# Patient Record
Sex: Female | Born: 1935 | Race: White | Hispanic: No | Smoking: Former smoker
Health system: Southern US, Community
[De-identification: ages and names within clinical notes are randomized; demographics above are authoritative.]

## PROBLEM LIST (undated history)

## (undated) DIAGNOSIS — E079 Disorder of thyroid, unspecified: Secondary | ICD-10-CM

## (undated) DIAGNOSIS — I10 Essential (primary) hypertension: Secondary | ICD-10-CM

## (undated) DIAGNOSIS — E039 Hypothyroidism, unspecified: Secondary | ICD-10-CM

## (undated) DIAGNOSIS — M1990 Unspecified osteoarthritis, unspecified site: Secondary | ICD-10-CM

## (undated) DIAGNOSIS — I4821 Permanent atrial fibrillation: Secondary | ICD-10-CM

## (undated) DIAGNOSIS — D696 Thrombocytopenia, unspecified: Secondary | ICD-10-CM

## (undated) DIAGNOSIS — I350 Nonrheumatic aortic (valve) stenosis: Secondary | ICD-10-CM

## (undated) DIAGNOSIS — I5032 Chronic diastolic (congestive) heart failure: Secondary | ICD-10-CM

## (undated) DIAGNOSIS — Z8711 Personal history of peptic ulcer disease: Secondary | ICD-10-CM

## (undated) DIAGNOSIS — Z950 Presence of cardiac pacemaker: Secondary | ICD-10-CM

## (undated) DIAGNOSIS — Z952 Presence of prosthetic heart valve: Secondary | ICD-10-CM

## (undated) DIAGNOSIS — G629 Polyneuropathy, unspecified: Secondary | ICD-10-CM

## (undated) DIAGNOSIS — E785 Hyperlipidemia, unspecified: Secondary | ICD-10-CM

## (undated) DIAGNOSIS — E7800 Pure hypercholesterolemia, unspecified: Secondary | ICD-10-CM

## (undated) DIAGNOSIS — I495 Sick sinus syndrome: Secondary | ICD-10-CM

## (undated) HISTORY — DX: Personal history of peptic ulcer disease: Z87.11

## (undated) HISTORY — DX: Chronic diastolic (congestive) heart failure: I50.32

## (undated) HISTORY — PX: APPENDECTOMY: SHX54

## (undated) HISTORY — DX: Hyperlipidemia, unspecified: E78.5

## (undated) HISTORY — DX: Sick sinus syndrome: I49.5

## (undated) HISTORY — PX: ABDOMINAL HYSTERECTOMY: SHX81

## (undated) HISTORY — DX: Essential (primary) hypertension: I10

## (undated) HISTORY — DX: Nonrheumatic aortic (valve) stenosis: I35.0

## (undated) HISTORY — DX: Polyneuropathy, unspecified: G62.9

## (undated) HISTORY — DX: Disorder of thyroid, unspecified: E07.9

## (undated) HISTORY — DX: Permanent atrial fibrillation: I48.21

## (undated) HISTORY — DX: Pure hypercholesterolemia, unspecified: E78.00

## (undated) HISTORY — DX: Thrombocytopenia, unspecified: D69.6

---

## 1998-05-31 ENCOUNTER — Inpatient Hospital Stay (HOSPITAL_COMMUNITY): Admission: EM | Admit: 1998-05-31 | Discharge: 1998-06-09 | Payer: Self-pay | Admitting: Cardiology

## 1998-05-31 ENCOUNTER — Encounter: Payer: Self-pay | Admitting: Cardiology

## 1999-01-06 ENCOUNTER — Ambulatory Visit (HOSPITAL_COMMUNITY): Admission: RE | Admit: 1999-01-06 | Discharge: 1999-01-06 | Payer: Self-pay | Admitting: Obstetrics and Gynecology

## 1999-01-06 ENCOUNTER — Encounter: Payer: Self-pay | Admitting: Obstetrics and Gynecology

## 1999-07-28 ENCOUNTER — Other Ambulatory Visit: Admission: RE | Admit: 1999-07-28 | Discharge: 1999-07-28 | Payer: Self-pay | Admitting: Obstetrics and Gynecology

## 2000-05-11 ENCOUNTER — Ambulatory Visit (HOSPITAL_COMMUNITY): Admission: RE | Admit: 2000-05-11 | Discharge: 2000-05-11 | Payer: Self-pay | Admitting: Obstetrics and Gynecology

## 2000-05-11 ENCOUNTER — Encounter: Payer: Self-pay | Admitting: Obstetrics and Gynecology

## 2000-11-23 ENCOUNTER — Other Ambulatory Visit: Admission: RE | Admit: 2000-11-23 | Discharge: 2000-11-23 | Payer: Self-pay | Admitting: Obstetrics and Gynecology

## 2001-05-09 ENCOUNTER — Encounter: Admission: RE | Admit: 2001-05-09 | Discharge: 2001-05-09 | Payer: Self-pay | Admitting: Obstetrics and Gynecology

## 2001-05-09 ENCOUNTER — Encounter: Payer: Self-pay | Admitting: Obstetrics and Gynecology

## 2004-12-07 ENCOUNTER — Observation Stay (HOSPITAL_COMMUNITY): Admission: RE | Admit: 2004-12-07 | Discharge: 2004-12-08 | Payer: Self-pay | Admitting: Orthopedic Surgery

## 2004-12-07 HISTORY — PX: SHOULDER SURGERY: SHX246

## 2005-05-15 ENCOUNTER — Inpatient Hospital Stay (HOSPITAL_COMMUNITY): Admission: EM | Admit: 2005-05-15 | Discharge: 2005-05-17 | Payer: Self-pay | Admitting: Cardiology

## 2005-05-16 HISTORY — PX: CARDIOVERSION: SHX1299

## 2006-01-31 ENCOUNTER — Encounter: Payer: Self-pay | Admitting: Cardiology

## 2006-12-18 ENCOUNTER — Ambulatory Visit (HOSPITAL_COMMUNITY): Admission: RE | Admit: 2006-12-18 | Discharge: 2006-12-18 | Payer: Self-pay | Admitting: Internal Medicine

## 2006-12-18 ENCOUNTER — Ambulatory Visit: Payer: Self-pay | Admitting: Vascular Surgery

## 2006-12-18 ENCOUNTER — Encounter: Payer: Self-pay | Admitting: Vascular Surgery

## 2006-12-20 ENCOUNTER — Encounter: Admission: RE | Admit: 2006-12-20 | Discharge: 2006-12-20 | Payer: Self-pay | Admitting: Internal Medicine

## 2006-12-31 ENCOUNTER — Encounter: Admission: RE | Admit: 2006-12-31 | Discharge: 2006-12-31 | Payer: Self-pay | Admitting: Internal Medicine

## 2006-12-31 ENCOUNTER — Encounter (INDEPENDENT_AMBULATORY_CARE_PROVIDER_SITE_OTHER): Payer: Self-pay | Admitting: *Deleted

## 2007-01-01 ENCOUNTER — Encounter (INDEPENDENT_AMBULATORY_CARE_PROVIDER_SITE_OTHER): Payer: Self-pay | Admitting: *Deleted

## 2007-01-01 ENCOUNTER — Encounter: Admission: RE | Admit: 2007-01-01 | Discharge: 2007-01-01 | Payer: Self-pay | Admitting: Internal Medicine

## 2007-01-01 ENCOUNTER — Other Ambulatory Visit: Admission: RE | Admit: 2007-01-01 | Discharge: 2007-01-01 | Payer: Self-pay | Admitting: Interventional Radiology

## 2007-02-21 ENCOUNTER — Encounter: Admission: RE | Admit: 2007-02-21 | Discharge: 2007-02-21 | Payer: Self-pay | Admitting: Surgery

## 2007-02-22 ENCOUNTER — Encounter (INDEPENDENT_AMBULATORY_CARE_PROVIDER_SITE_OTHER): Payer: Self-pay | Admitting: Surgery

## 2007-02-22 ENCOUNTER — Ambulatory Visit (HOSPITAL_BASED_OUTPATIENT_CLINIC_OR_DEPARTMENT_OTHER): Admission: RE | Admit: 2007-02-22 | Discharge: 2007-02-22 | Payer: Self-pay | Admitting: Surgery

## 2007-02-22 ENCOUNTER — Encounter: Admission: RE | Admit: 2007-02-22 | Discharge: 2007-02-22 | Payer: Self-pay | Admitting: Surgery

## 2007-02-22 HISTORY — PX: BREAST BIOPSY: SHX20

## 2007-03-11 ENCOUNTER — Inpatient Hospital Stay (HOSPITAL_COMMUNITY): Admission: EM | Admit: 2007-03-11 | Discharge: 2007-03-17 | Payer: Self-pay | Admitting: Emergency Medicine

## 2007-03-12 HISTORY — PX: ESOPHAGOGASTRODUODENOSCOPY: SHX1529

## 2007-12-23 ENCOUNTER — Encounter: Admission: RE | Admit: 2007-12-23 | Discharge: 2007-12-23 | Payer: Self-pay | Admitting: Internal Medicine

## 2008-06-18 ENCOUNTER — Inpatient Hospital Stay (HOSPITAL_COMMUNITY): Admission: RE | Admit: 2008-06-18 | Discharge: 2008-06-19 | Payer: Self-pay | Admitting: *Deleted

## 2008-06-18 HISTORY — PX: PACEMAKER INSERTION: SHX728

## 2009-01-18 ENCOUNTER — Encounter: Admission: RE | Admit: 2009-01-18 | Discharge: 2009-01-18 | Payer: Self-pay | Admitting: Internal Medicine

## 2009-01-26 ENCOUNTER — Encounter: Admission: RE | Admit: 2009-01-26 | Discharge: 2009-01-26 | Payer: Self-pay | Admitting: Internal Medicine

## 2009-07-02 ENCOUNTER — Encounter: Admission: RE | Admit: 2009-07-02 | Discharge: 2009-07-02 | Payer: Self-pay | Admitting: Internal Medicine

## 2009-09-16 ENCOUNTER — Encounter: Admission: RE | Admit: 2009-09-16 | Discharge: 2009-09-16 | Payer: Self-pay | Admitting: Internal Medicine

## 2010-06-02 ENCOUNTER — Ambulatory Visit: Payer: Self-pay | Admitting: Cardiology

## 2010-06-02 ENCOUNTER — Encounter: Payer: Self-pay | Admitting: Internal Medicine

## 2010-06-07 ENCOUNTER — Ambulatory Visit: Payer: Self-pay | Admitting: Cardiology

## 2010-06-14 ENCOUNTER — Ambulatory Visit: Payer: Self-pay | Admitting: Cardiology

## 2010-06-28 ENCOUNTER — Ambulatory Visit: Payer: Self-pay | Admitting: Cardiology

## 2010-07-13 ENCOUNTER — Ambulatory Visit: Payer: Self-pay | Admitting: Cardiovascular Disease

## 2010-07-13 ENCOUNTER — Encounter: Admission: RE | Admit: 2010-07-13 | Discharge: 2010-07-13 | Payer: Self-pay | Admitting: Internal Medicine

## 2010-07-27 ENCOUNTER — Ambulatory Visit: Payer: Self-pay | Admitting: Cardiology

## 2010-08-13 ENCOUNTER — Encounter: Payer: Self-pay | Admitting: Internal Medicine

## 2010-08-19 ENCOUNTER — Ambulatory Visit: Payer: Self-pay | Admitting: Cardiology

## 2010-09-19 ENCOUNTER — Ambulatory Visit: Payer: Self-pay | Admitting: Cardiology

## 2010-10-21 ENCOUNTER — Ambulatory Visit: Payer: Self-pay | Admitting: Cardiology

## 2010-10-28 ENCOUNTER — Ambulatory Visit
Admission: RE | Admit: 2010-10-28 | Discharge: 2010-10-28 | Payer: Self-pay | Source: Home / Self Care | Attending: Internal Medicine | Admitting: Internal Medicine

## 2010-10-28 ENCOUNTER — Encounter: Payer: Self-pay | Admitting: Internal Medicine

## 2010-10-28 DIAGNOSIS — I495 Sick sinus syndrome: Secondary | ICD-10-CM | POA: Insufficient documentation

## 2010-10-28 DIAGNOSIS — I482 Chronic atrial fibrillation, unspecified: Secondary | ICD-10-CM | POA: Insufficient documentation

## 2010-10-28 DIAGNOSIS — E785 Hyperlipidemia, unspecified: Secondary | ICD-10-CM | POA: Insufficient documentation

## 2010-11-03 NOTE — Assessment & Plan Note (Signed)
Summary: pacer check/st. jude   Visit Type:  Pacemaker check Referring Provider:  Dr Patty Sermons Primary Provider:  Dr Burton Apley   History of Present Illness: Ms Godeaux is a pleasant 77 yo WF with a h/o sick sinus syndrome and persistant atrial fibrillation s/p PPM implantation (SJM) by Dr Reyes Ivan who presents today to establish care in the EP device clinic.  She reports doing ver well recently.  She remains very active.  The patient denies symptoms of palpitations, chest pain, shortness of breath, orthopnea, PND, lower extremity edema, dizziness, presyncope, syncope, or neurologic sequela. The patient is tolerating medications without difficulties and is otherwise without complaint today.   Current Medications (verified): 1)  Furosemide 40 Mg Tabs (Furosemide) .... Take One Tablet By Mouth Every Other Day Alternating With 2 Tabs Every Other Day 2)  Digoxin 0.25 Mg Tabs (Digoxin) .... Take 1/2 Tablet By Mouth Daily 3)  Metoprolol Tartrate 100 Mg Tabs (Metoprolol Tartrate) .... Take One Tablet By Mouth Twice A Day 4)  Diltiazem Hcl Er Beads 240 Mg Xr24h-Cap (Diltiazem Hcl Er Beads) .... Take One Capsule By Mouth Daily 5)  Metformin Hcl 500 Mg Tabs (Metformin Hcl) .... 2 Tabs Two Times A Day 6)  Simvastatin 20 Mg Tabs (Simvastatin) .... Take One Tablet By Mouth Daily At Bedtime 7)  Niaspan 500 Mg Cr-Tabs (Niacin (Antihyperlipidemic)) .... Once Daily 8)  Warfarin Sodium 2.5 Mg Tabs (Warfarin Sodium) .... Use As Directed By Anticoagualtion Clinic 9)  Gabapentin 300 Mg Caps (Gabapentin) .... Take 1 Tab in The Am and 3 At Bedtime 10)  Vitamin B-12 500 Mcg  Tabs (Cyanocobalamin) .... Once Daily 11)  Ocuvite  Tabs (Multiple Vitamins-Minerals) .... 2 Once Daily  Allergies (verified): 1)  ! Codeine  Past History:  Past Medical History:  Hypertension.   Atrial fibrillation status post DC cardioversion   Bradycardia s/p PPM . Hyperlipidemia.  . Peripheral neuropathy.  . Remote history of  peptic ulcer disease.   Status post cataract surgery.   Status post appendectomy.  . Status post hysterectomy.  .Status post right rotator cuff surgery.  .Status post left breast lumpectomy and biopsy with benign       pathology.      Past Surgical History: Abdominal Hysterectomy-Total Appendectomy s/p PPM by Dr Reyes Ivan (SJM) 2009  Social History: Reviewed history from 10/28/2010 and no changes required. The patient is married.  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  She has a remote history of tobacco,   but quit over 35-40 years ago.  She drinks about one glass of wine per   month.   Review of Systems       All systems are reviewed and negative except as listed in the HPI.   Vital Signs:  Patient profile:   77 year old female Height:      66 inches Weight:      156 pounds BMI:     25.27 Pulse rate:   94 / minute BP sitting:   124 / 62  (left arm) Cuff size:   regular  Vitals Entered By: Hardin Negus, RMA (October 28, 2010 12:05 PM)  Physical Exam  General:  Well developed, well nourished, in no acute distress. Head:  normocephalic and atraumatic Eyes:  PERRLA/EOM intact; conjunctiva and lids normal. Mouth:  Teeth, gums and palate normal. Oral mucosa normal. Neck:  supple Chest Wall:  pacemaker pocket is well healed Lungs:  CTAB Heart:  RRR (Paced),  2/6 SEM LUSB (mid peaking) with audible S2 Abdomen:  Bowel sounds positive; abdomen soft and non-tender without masses, organomegaly, or hernias noted. No hepatosplenomegaly. Msk:  Back normal, normal gait. Muscle strength and tone normal. Extremities:  No clubbing or cyanosis. Neurologic:  Alert and oriented x 3. Skin:  Intact without lesions or rashes.   PPM Specifications Following MD:  Hillis Range, MD     Referring MD:  Rolla Plate PPM Vendor:  [facility] Jude     PPM Model Number:  [id number]     PPM Serial Number:  [id number] PPM DOI:  06/18/2008     PPM Implanting MD:  Charlynn Court  Lead 1    Location: RV     DOI: 06/18/2008     Model #: [id number]     Serial #: [id number]     Status: active  Magnet Response Rate:  BOL 98.6 ERI 86.3  Indications:  A-fib   PPM Follow Up Battery Voltage:  2.80 V     Battery Est. Longevity:  10 yrs     Right Ventricle  Amplitude: 12.0 mV, Impedance: 504 ohms, Threshold: 0.75 V at 0.40 msec  Episodes MS Episodes:  0     Percent Mode Switch:  0     Coumadin:  Yes Ventricular High Rate:  0     Ventricular Pacing:  13%  Parameters Mode:  VVI     Lower Rate Limit:  65     Next Cardiology Appt Due:  04/03/2011 Tech Comments:  NORMAL DEVICE FUNCTION.  CHANGED RV OUTPUT FROM 2.0 TO 2.5 V.  ROV IN 6 MTHS W/DEVICE CLINIC. Vella Kohler  October 28, 2010 12:17 PM MD Comments:  agree  Impression & Recommendations:  Problem # 1:  BRADYCARDIA-TACHYCARDIA SYNDROME (ICD-427.81) normal pacemaker function as above  Problem # 2:  ATRIAL FIBRILLATION, CHRONIC (ICD-427.31) stable continue coumadin  Problem # 3:  HYPERLIPIDEMIA (ICD-272.4) stable  Patient Instructions: 1)  Your physician wants you to follow-up in 6 months with device clinic and 12months with Dr Johney Frame:   You will receive a reminder letter in the mail two months in advance. If you don't receive a letter, please call our office to schedule the follow-up appointment. 2)  Your physician recommends that you continue on your current medications as directed. Please refer to the Current Medication list given to you today.

## 2010-11-03 NOTE — Miscellaneous (Signed)
Summary: Device preload  Clinical Lists Changes  Observations: Added new observation of PPM INDICATN: A-fib (08/13/2010 12:36) Added new observation of MAGNET RTE: BOL 98.6 ERI 86.3 (08/13/2010 12:36) Added new observation of PPMLEADSTAT1: active (08/13/2010 12:36) Added new observation of PPMLEADSER1: [id number] (08/13/2010 12:36) Added new observation of PPMLEADMOD1: 1688TC (08/13/2010 12:36) Added new observation of PPMLEADDOI1: 06/18/2008 (08/13/2010 12:36) Added new observation of PPMLEADLOC1: RV (08/13/2010 12:36) Added new observation of PPM DOI: 06/18/2008 (08/13/2010 12:36) Added new observation of PPM SERL#: [id number]  (08/13/2010 12:36) Added new observation of PPM MODL#: [id number]  (08/13/2010 12:36) Added new observation of PACEMAKERMFG: [employer] Jude  (08/13/2010 12:36) Added new observation of PPM IMP MD: Charlynn Court  (08/13/2010 12:36) Added new observation of PPM REFER MD: Rolla Plate  (08/13/2010 12:36) Added new observation of PACEMAKER MD: Hillis Range, MD  (08/13/2010 12:36)      PPM Specifications Following MD:  Hillis Range, MD     Referring MD:  Rolla Plate PPM Vendor:  [employer]     PPM Model Number:  [id number]     PPM Serial Number:  [id number] PPM DOI:  06/18/2008     PPM Implanting MD:  Charlynn Court  Lead 1    Location: RV     DOI: 06/18/2008     Model #: [id number]     Serial #: [id number]     Status: active  Magnet Response Rate:  BOL 98.6 ERI 86.3  Indications:  A-fib

## 2010-11-09 NOTE — Cardiovascular Report (Signed)
Summary: Office Visit   Office Visit   Imported By: Roderic Ovens 11/02/2010 15:13:25  _____________________________________________________________________  External Attachment:    Type:   Image     Comment:   External Document

## 2010-11-18 ENCOUNTER — Other Ambulatory Visit (INDEPENDENT_AMBULATORY_CARE_PROVIDER_SITE_OTHER): Payer: Medicare Other

## 2010-11-18 DIAGNOSIS — I4891 Unspecified atrial fibrillation: Secondary | ICD-10-CM

## 2010-11-18 DIAGNOSIS — Z7901 Long term (current) use of anticoagulants: Secondary | ICD-10-CM

## 2010-11-23 NOTE — Progress Notes (Signed)
Summary: [facility]: Office Visit  [facility]: Office Visit   Imported By: Earl Many 11/14/2010 11:47:40  _____________________________________________________________________  External Attachment:    Type:   Image     Comment:   External Document

## 2010-12-01 ENCOUNTER — Other Ambulatory Visit: Payer: Self-pay | Admitting: Neurosurgery

## 2010-12-01 DIAGNOSIS — M545 Low back pain: Secondary | ICD-10-CM

## 2010-12-01 DIAGNOSIS — M25552 Pain in left hip: Secondary | ICD-10-CM

## 2010-12-05 ENCOUNTER — Encounter (INDEPENDENT_AMBULATORY_CARE_PROVIDER_SITE_OTHER): Payer: Medicare Other

## 2010-12-05 DIAGNOSIS — I4891 Unspecified atrial fibrillation: Secondary | ICD-10-CM

## 2010-12-05 DIAGNOSIS — Z7901 Long term (current) use of anticoagulants: Secondary | ICD-10-CM

## 2010-12-15 ENCOUNTER — Ambulatory Visit
Admission: RE | Admit: 2010-12-15 | Discharge: 2010-12-15 | Disposition: A | Discharge status: Home / Self Care | Payer: Medicare Other | Source: Ambulatory Visit | Attending: Neurosurgery | Admitting: Neurosurgery

## 2010-12-15 ENCOUNTER — Ambulatory Visit
Admission: RE | Admit: 2010-12-15 | Discharge: 2010-12-15 | Disposition: A | Discharge status: Home / Self Care | Payer: MEDICARE | Source: Ambulatory Visit | Attending: Neurosurgery | Admitting: Neurosurgery

## 2010-12-15 DIAGNOSIS — M25552 Pain in left hip: Secondary | ICD-10-CM

## 2010-12-15 DIAGNOSIS — M545 Low back pain: Secondary | ICD-10-CM

## 2010-12-19 ENCOUNTER — Ambulatory Visit (INDEPENDENT_AMBULATORY_CARE_PROVIDER_SITE_OTHER): Payer: Medicare Other | Admitting: Cardiology

## 2010-12-19 DIAGNOSIS — E781 Pure hyperglyceridemia: Secondary | ICD-10-CM

## 2010-12-19 DIAGNOSIS — E119 Type 2 diabetes mellitus without complications: Secondary | ICD-10-CM

## 2010-12-19 DIAGNOSIS — I359 Nonrheumatic aortic valve disorder, unspecified: Secondary | ICD-10-CM

## 2010-12-19 DIAGNOSIS — I4891 Unspecified atrial fibrillation: Secondary | ICD-10-CM

## 2010-12-27 ENCOUNTER — Other Ambulatory Visit: Payer: Self-pay | Admitting: Cardiology

## 2010-12-27 DIAGNOSIS — I4891 Unspecified atrial fibrillation: Secondary | ICD-10-CM

## 2010-12-28 ENCOUNTER — Other Ambulatory Visit (HOSPITAL_COMMUNITY): Payer: Self-pay | Admitting: Cardiology

## 2010-12-28 DIAGNOSIS — I350 Nonrheumatic aortic (valve) stenosis: Secondary | ICD-10-CM

## 2010-12-29 ENCOUNTER — Ambulatory Visit (HOSPITAL_COMMUNITY): Payer: Medicare Other | Attending: Internal Medicine

## 2010-12-29 DIAGNOSIS — I359 Nonrheumatic aortic valve disorder, unspecified: Secondary | ICD-10-CM | POA: Insufficient documentation

## 2010-12-29 DIAGNOSIS — I350 Nonrheumatic aortic (valve) stenosis: Secondary | ICD-10-CM

## 2010-12-29 NOTE — Telephone Encounter (Signed)
escribe request  

## 2010-12-29 NOTE — Telephone Encounter (Signed)
[facility] °

## 2011-01-16 ENCOUNTER — Ambulatory Visit (INDEPENDENT_AMBULATORY_CARE_PROVIDER_SITE_OTHER): Payer: Medicare Other | Admitting: *Deleted

## 2011-01-16 DIAGNOSIS — I4891 Unspecified atrial fibrillation: Secondary | ICD-10-CM

## 2011-01-16 DIAGNOSIS — Z7901 Long term (current) use of anticoagulants: Secondary | ICD-10-CM

## 2011-01-16 LAB — POCT INR: INR: 2.6

## 2011-01-18 ENCOUNTER — Telehealth: Payer: Self-pay | Admitting: Cardiology

## 2011-01-18 NOTE — Telephone Encounter (Signed)
HAS NOT HEARD BACK ABOUT HER TEST - ECHO CAN SOMEONE CALL

## 2011-01-20 ENCOUNTER — Encounter: Payer: Self-pay | Admitting: Cardiology

## 2011-02-13 ENCOUNTER — Ambulatory Visit (INDEPENDENT_AMBULATORY_CARE_PROVIDER_SITE_OTHER): Payer: Medicare Other | Admitting: *Deleted

## 2011-02-13 DIAGNOSIS — Z7901 Long term (current) use of anticoagulants: Secondary | ICD-10-CM

## 2011-02-13 DIAGNOSIS — I4891 Unspecified atrial fibrillation: Secondary | ICD-10-CM

## 2011-02-14 NOTE — Discharge Summary (Signed)
[name], [name] NO.:  [id number]   MEDICAL RECORD NO.:  [id number]          PATIENT TYPE:  INP   LOCATION:  3704                         FACILITY:  [facility]   PHYSICIAN:  Elmore Guise., M.D.DATE OF BIRTH:  1927/06/10   DATE OF ADMISSION:  06/18/2008  DATE OF DISCHARGE:  06/19/2008                               DISCHARGE SUMMARY   DISCHARGE DIAGNOSES:  1. Slow atrial fibrillation status post single chamber pacemaker      implant.  2. Diabetes mellitus.  3. Dyslipidemia.  4. Hypertension.   HISTORY OF PRESENT ILLNESS:  [name] is a very pleasant 77 year old  white female who has history of atrial fibrillation.  She was referred  for pacemaker implant  because of bradycardia.   HOSPITAL COURSE:  The patient's hospital course was uncomplicated.  She  underwent single chamber permanent pacemaker implant on June 18, 2008.  Her postprocedure course also was uncomplicated.  She did have  one episode where she felt red and flushed after getting Ancef.  Her  subsequent doses of Ancef were discontinued.  She has had no fever,  chills, nausea, vomiting, no diarrhea.  Her site looks good with no  evidence of swelling, bleeding, or erythema.  She has been afebrile.  She will be discharged home today to continue the following medications.  1. Coumadin 2.5 mg daily.  2. Metoprolol 100 mg daily.  3. Digoxin 0.125 mg every other day.  4. Calcium with vitamin D once daily.  5. Lasix 40 mg daily.  6. Ocuvite 1 tablet twice daily.  7. Metformin 500 mg daily.  8. Neurontin 300 mg three times daily.  9. Cardizem 240 mg each evening.  10.Simvastatin 20 mg daily.  11.Niaspan 500 mg daily.  12.She is to use Tylenol Extra Strength as needed for pain.   She was given post pacemaker restrictions.  We did specifically discuss  not lifting anything heavier than 5 pounds with her left arm or lifting  her left arm above shoulder height for the next 2-3 weeks.  She is  also  to keep this area clean and dry for the next week and Betadine or Steri-  Strips once daily for the next 3 days.  She is to call the office if she  has any further problems otherwise I will see her in 1 week for post  pacemaker check and wound check.      Elmore Guise., M.D.  Electronically Signed     TWK/MEDQ  D:  06/19/2008  T:  06/19/2008  Job:  [id number]   cc:   Cassell Clement, M.D.

## 2011-02-14 NOTE — Op Note (Signed)
[name], [name]               ACCOUNT NO.:  [id number]   MEDICAL RECORD NO.:  [id number]          PATIENT TYPE:  AMB   LOCATION:  DSC                          FACILITY:  [facility]   PHYSICIAN:  Currie Paris, M.D.DATE OF BIRTH:  1927-07-08   DATE OF PROCEDURE:  02/22/2007  DATE OF DISCHARGE:                               OPERATIVE REPORT   OFFICE MEDICAL RECORD NUMBER:  [id number].   PREOPERATIVE DIAGNOSIS:  Left breast mass, indeterminate on core biopsy.   POSTOPERATIVE DIAGNOSIS:  Left breast mass, indeterminate on core  biopsy.   OPERATION:  Needle-guided excision of left breast mass.   SURGEON:  Currie Paris, M.D.   ANESTHESIA:  MAC.   CLINICAL HISTORY:  This is a 77 year old lady who had an abnormality  seen on mammography, and a core biopsy was indeterminate, showing a  sclerosing lesion.  It was thought that she needed an excisional biopsy  to clarify the diagnosis.   DESCRIPTION OF PROCEDURE:  The patient was seen in the holding area, and  we confirmed that a left breast biopsy was the planned procedure.  She  and I both marked the left breast as the operative side.  Her guidewire  had already been placed by radiology, and I reviewed those films.   The patient was taken to the operating room.  After satisfactory IV  sedation, the left breast was prepped and draped as a sterile field.  The time-out occurred.   One percent Xylocaine with epinephrine mixed equally with 0.5% plain  Marcaine was used for local.  I infiltrated the proposed incision line,  which was going to be a radial incision at the midlateral position, as  that was where the guidewire entered and was apparently tracking  somewhat superiorly, deep, and medial.   The incision was made, and I divided about 1.5 cm of the subcutaneous  tissue and then grasped the tissue around the guidewire and used that  for some traction and then excised a cylinder of tissue around the  guidewire, going  beyond the tip.  The guidewire felt to be fairly  central in the specimen.  This was sent for specimen mammography.   I infiltrated additional local as I had worked, and we made sure  everything was completely numb.  I put some local deep to be sure that  those areas were well anesthetized for postop pain relief.  Once  everything appeared to be dry, I closed in layers with 3-0 Vicryl,  irrigating between layers as we got closer to the skin.  Skin was closed  with 4-0  Monocryl subcuticular and Dermabond.  Radiology reported that the  specimen contained the clips and apparent abnormality.  The patient  tolerated the procedure well.  There were no operative complications,  and all counts were correct.      Currie Paris, M.D.  Electronically Signed     CJS/MEDQ  D:  02/22/2007  T:  02/22/2007  Job:  [id number]   cc:   Antony Madura, M.D.  Cassell Clement, M.D.

## 2011-02-14 NOTE — H&P (Signed)
[name], [name] NO.:  [id number]   MEDICAL RECORD NO.:  [id number]          PATIENT TYPE:  INP   LOCATION:  2108                         FACILITY:  [facility]   PHYSICIAN:  Hillery Aldo, M.D.   DATE OF BIRTH:  05/23/1927   DATE OF ADMISSION:  03/11/2007  DATE OF DISCHARGE:                              HISTORY & PHYSICAL   PRIMARY CARE PHYSICIAN:  Dr. Burton Apley.   CARDIOLOGIST:  Dr. Patty Sermons.   CHIEF COMPLAINT:  Nausea, vomiting with coffee-ground emesis, melena.   HISTORY OF PRESENT ILLNESS:  The patient is a 77 year old female with  past medical history of peptic ulcer disease remotely who presents with  several days of melanotic stools.  The patient describes the stools as  dark in color and somewhat hard to pass, and so she does not think very  much of it.  However, the patient did develop some black clots in the  stool today and became concerned.  She also had one episode of nausea  accompanied by vomiting with coffee-ground appearance to the emesis.  She also was dizzy today and feels exceptionally weak prompting her to  come to the emergency department for evaluation.  The patient has  evidence of an acute GI bleed, and therefore is being admitted for  further evaluation and workup.   PAST MEDICAL HISTORY:  1. Hypertension.  2. Atrial fibrillation status post DC cardioversion in the past.  3. Hyperlipidemia.  4. Type 2 diabetes.  5. Peripheral neuropathy.  6. Remote history of peptic ulcer disease.  7. Status post cataract surgery.  8. Status post appendectomy.  9. Status post hysterectomy.  10.Status post right rotator cuff surgery.  11.Status post left breast lumpectomy and biopsy with benign      pathology.   FAMILY HISTORY:  The patient's mother died at age 22 from complications  of childbirth pneumonia.  The patient's father died at 11 from  congestive heart failure.  He also suffered with coronary artery disease  and emphysema.   She has eight siblings.  Three sisters are deceased.  One died early in childhood.  One sister died of breast cancer, but also  suffered from congestive heart failure and coronary artery disease.  One  sister died of a cerebral hemorrhage due to aneurysm.  She has five  other healthy sisters.  She does not have any children.   SOCIAL HISTORY:  The patient is married.  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  She has a remote history of tobacco,  but quit over 35-40 years ago.  She drinks about one glass of wine per  month.   ALLERGIES:  CODEINE causes nausea and vomiting.   CURRENT MEDICATIONS:  1. Digitek 0.25 mg daily.  2. Neurontin 100 mg t.i.d.  3. Coumadin 2.5 mg as directed.  4. Metoprolol 100 mg b.i.d.  5. Zetia 10 mg daily.  6. Metformin 500 mg daily.  7. Cardizem 240 mg daily.  8. Vitamin B1 daily.  9. Ocuvite one tablet daily.  10.Flaxseed oil.   REVIEW OF  SYSTEMS:  The patient's appetite has been normal up until  today.  She denies any weight changes except for some slight weight loss  that was intentional.  Denies any chest pains.  She had some shortness  of breath this morning and has had a dry cough.  She also had some  abdominal pain which she describes as an aching pain similar to her  ulcer pain in the past prior to an episode of nausea and vomiting today.  Otherwise, as noted in the elements of HPI above.   PHYSICAL EXAMINATION:  VITAL SIGNS:  Temperature 98.9, pulse 76,  respirations 18, blood pressure 104/68, O2 saturation 100% on room air.  GENERAL:  Well-developed, well-nourished female who is no acute  distress.  She is pale in appearance.  HEENT: Normocephalic, atraumatic.  PERRL, EOMI.  Oropharynx clear with  moist mucous membranes.  NECK:  Supple, no thyromegaly, lymphadenopathy, no jugular venous  distension.  CHEST:  Lungs clear to auscultation bilaterally with good air movement.  HEART:  Heart sounds are irregularly irregular  and somewhat  tachycardiac.  No murmurs or rubs.  ABDOMEN:  Soft, nontender, nondistended with normoactive bowel sounds.  RECTAL:  Done by the ED physician did reveal fecal occult blood positive  stool.  EXTREMITIES:  There is 1+ edema bilaterally.  SKIN:  Warm and dry, pale.  NEUROLOGIC:  The patient is alert and oriented x3.  Cranial nerves II-  XII grossly intact.  Nonfocal.   LABORATORY DATA REVIEW:  White blood cell count 10.3, hemoglobin 10.1,  hematocrit 30.1, platelets 208 with an absolute neutrophil count 8.9,  MCV 90.7.  Sodium 140, potassium 4.7, chloride 106, bicarb 28, BUN 57,  creatinine 0.56, glucose 211.  Total bilirubin 0.8.  Total protein 5.9.  Albumin 3.1.  Calcium 8.8.  AST 27, ALT 24.  PT/INR 30.8/2.8.   ASSESSMENT/PLAN:  1. Upper gastrointestinal bleed likely secondary to her current ulcer:      We will admit the patient to the step-down unit and monitor her      hemodynamic status closely as well as check her hemoglobin q.6 h.      for the first 24 hours and transfuse as needed.  Dr. Matthias Hughs of      gastroenterology has already been consulted, and there are plans to      take her for an upper endoscopy tonight.  She has received fresh      frozen plasma to reverse her elevated INR.  She has been started on      IV Protonix which we will continue q.12 hours.  We will hold all      nonessential medications for now and keep her n.p.o.  She will      likely undergo testing for Helicobacter pylori with her upper      endoscopy and will treat this if it is positive.  Further      management will depend on clinical course.  2. Atrial fibrillation:  Continue the patient's Digitek and Cardizem      as well as metoprolol as long as she remains hemodynamically      stable.  We will support her with IV fluids as well.  We will hold      her therapeutic anticoagulation for now.  3. Diabetes:  Will hold the patient's metformin and use sliding scale     insulin for now.  We  will check her sugars q.4 h. and cover her as  needed.  4. Hyperlipidemia:  Will hold the patient steady until her      gastrointestinal status is stable.  5. Peripheral neuropathy:  We will hold the patient's Neurontin until      her gastrointestinal status is stable.  6. Prophylaxis:  We will use PAS hoses for deep venous thrombosis      prophylaxis, and she will be on q.12 h. proton pump inhibitor      therapy for her likely peptic ulcer disease.      Hillery Aldo, M.D.  Electronically Signed     CR/MEDQ  D:  03/11/2007  T:  03/12/2007  Job:  [id number]   cc:   Antony Madura, M.D.  Cassell Clement, M.D.

## 2011-02-14 NOTE — Consult Note (Signed)
[name], [name] NO.:  [id number]   MEDICAL RECORD NO.:  [id number]          PATIENT TYPE:  INP   LOCATION:  2108                         FACILITY:  [facility]   PHYSICIAN:  Janet Ras, MD   DATE OF BIRTH:  03-17-1927   DATE OF CONSULTATION:  DATE OF DISCHARGE:  03/12/2007                                 CONSULTATION   CONSULTING SURGEON:  Janet Ras, MD   REQUESTING PHYSICIAN:  Dr. Randa Benitez.   REASON FOR CONSULTATION:  We have been asked by Dr. Randa Benitez to see this  pleasant 79-year female patient who came in yesterday on the 9th with  complaints of acute GI bleeding.  She presented initially to [facility].  She reported at that time she was experiencing nausea and  upper abdominal pain. This resolved after she had a massive amount of  coffee-ground and tarry like emesis.  She is normally on Coumadin for  paroxysmal atrial fibrillation and her INR at presentation yesterday was  2.8.  She was stable up until about 2:00 a.m. when her systolic blood  pressure dropped in the 50s and her hemoglobin dropped to 6.3. It had  previously been 10.1, so she was given packed red blood cells as well as  FFP to reverse her Coumadin and she underwent emergent EGD.  She was  found to have a bleeding duodenal ulcer with an exposed vessel that was  clipped by Dr. Randa Benitez.  Patient currently is stable and she has been  subsequently transferred to [facility] to the MICU.  Hemoglobin is  still somewhat down at 7.9 but she reports feeling clinically better.  We have been asked to follow along with the patient in the event she  experiences re-bleeding that may require surgical intervention.   PAST MEDICAL HISTORY:  1. PAF/Coumadin  2. Remote history of peptic ulcer disease.  3. Recent NSAID use.  4. Diabetes  5. Hypertension  6. Dyslipidemia.   PAST SURGICAL HISTORY:  Hysterectomy, appendectomy, rotator cuff repair  and biopsy of benign breast  lesion.   LABS:  Hemoglobin today 7.9, platelets are down to 109,000.  There were  170,000.  BUN is elevated at 57.  Her INR today is 1.5.   PHYSICAL EXAM:  GENERAL:  Pleasant female patient without any further  complaints of pain and no further dark emesis.  She is reporting dark  stools that are foul-smelling.  VITAL SIGNS:  Temperature 99.2,  BP 108/43, pulse 112 and regular,  respirations 20 LUNGS:  Clear to auscultation.  Respiratory effort is  nonlabored.  CARDIAC:  S1-S2, no rubs, murmurs, thrills or gallops.  ABDOMEN:  Soft, nontender, nondistended without hepatosplenomegaly,  masses or bruits.  EXTREMITIES:  Symmetrical in appearance without edema.  Pulses palpable.   IMPRESSION:  1. Upper GI bleeding secondary to duodenal ulcer status post EGD with      clip application per GI.  2. Blood loss anemia.  Hemoglobin low but stable and increased.  3. Thrombocytopenia.   PLAN:  1. At this point the patient has no signs or symptoms of  continued      bleeding.  Will follow along with you in the event the patient      needs a surgical intervention.  2. Continue to watch the INR.  After the FFP that was given in the      past 24 hours washes out she could have a rebound or increase in      her INR.  3. Will defer any a transfusion of packed red blood cells to GI who is      the admitting service.      [name]      Janet Ras, MD  Electronically Signed    ALE/MEDQ  D:  03/12/2007  T:  03/12/2007  Job:  [id number]   cc:   [name]., M.D.

## 2011-02-14 NOTE — Discharge Summary (Signed)
[name], [name]               ACCOUNT NO.:  [id number]   MEDICAL RECORD NO.:  [id number]          PATIENT TYPE:  INP   LOCATION:  4736                         FACILITY:  [facility]   PHYSICIAN:  Ladell Pier, M.D.   DATE OF BIRTH:  [birth year]   DATE OF ADMISSION:  03/11/2007  DATE OF DISCHARGE:  03/17/2007                               DISCHARGE SUMMARY   DISCHARGE DIAGNOSIS:  1. Duodenal ulcer.  2. Gastrointestinal bleed secondary to nonsteroidal anti-inflammatory      drug use.  3. Blood loss anemia status post multiple transfusions.  4. Atrial fibrillation.  5. Thrombocytopenia.  6. Hypertension.  7. Dyslipidemia.  8. Diabetes.  9. Atrial fibrillation status post direct current cardioversion in the      past on chronic Coumadin therapy.  10.Peripheral neuropathy.  11.Remote history of peptic ulcer disease in 1997.  12.Status post cataract surgery.  13.Status post appendectomy.  14.Status post hysterectomy.  15.Status post right rotator cuff surgery.  16.Status post left breast lumpectomy and biopsy with benign      pathology.   DISCHARGE MEDICATIONS.:  1. Protonix 40 mg daily.  2. Digitek, 0.125 mg daily.  3. Diltiazem LA 180 mg daily.  4. Lasix 20 mg daily.  5. Protonix 40 mg daily.  6. Neurontin 100 mg 2 tablets at bedtime, 1 tablet in the morning.  7. Coumadin 2.5 mg daily except for 5 mg on Wednesdays.  8. Metoprolol 100 mg twice daily.  9. Zetia 10 mg daily.  10.Metformin 500 mg once daily.  11.Cardizem 240 mg daily.  12.Vitamin B1 daily.  13.Accu-Vite daily.  14.Flax seed oil daily.  15.Primrose oil daily.   FOLLOWUP APPOINTMENTS:  The patient to follow up with Dr. Matthias Hughs on  June 17 at 12:30 and with Dr. Patty Sermons on June 25.   PROCEDURES:  Upper endoscopy on March 12, 2007.  At the time of this  upper endoscopy, she was treated with 2 mL of epinephrine to the area of  the bleed, and Endoclip was placed.   CONSULTATIONS:  1. Cassell Clement, M.D.,  cardiology.  2. Bernette Redbird, M.D., gastroenterology.   HISTORY OF PRESENT ILLNESS:  The patient is a 77 year old female with  past medical history significant for peptic ulcer disease remotely who  presented with several days of melanotic stool. The patient described  the stool as dark in color. She started passing clots and later  developed some clots in the stool that was very concerning to her, so  she came into the emergency room. She also had some dizziness and felt  weak.   Past Medical History, Family History, Social History, Medications,  Allergies, Review of Systems are per admission H&P.   PHYSICAL EXAMINATION ON DISCHARGE:  VITAL SIGNS:  Temperature 97.8,  pulse of 34, respirations 20, blood pressure 125/62, pulse oximetry 95%  on room air.  CBG 110-160.  HEENT: Normocephalic, atraumatic.  Pupils reactive to light.  Throat  without erythema.  CARDIOVASCULAR: Irregular.  LUNGS:  Clear bilaterally.  ABDOMEN: Positive bowel sounds.  EXTREMITIES:  Without edema.   HOSPITAL COURSE:  #1.  DUODENAL  ULCER BLEED:  The patient was admitted to the hospital to  the intensive care unit.  She underwent upper endoscopy where the  bleeding was stopped with injections of epinephrine and Endoclip.  She  remained in the ICU.  Her hemoglobin after the transfusion was fairly  stable.  She was then transferred to the floor.  Her hemoglobin was 8.2  prior to discharge, so she was given 1 unit of blood per cardiology.  Hemoglobin increased to 9.6.  The patient will follow up outpatient.  Her Coumadin level was started prior to discharge, and her INR was 1.5.  She will resume her usual Coumadin dose   #2.  HYPERTENSION:  Her blood pressure medication was held during the  initial episode, but it was gradually restarted, and blood pressure on  discharge was 125/62.   #3.  ATRIAL FIBRILLATION:  Cardiology was consulted and managed her  atrial fibrillation with calcium channel blocker and  digoxin.   #4.  ANEMIA:  Blood loss anemia as explained earlier. She was transfused  several units of blood, and hemoglobin was stable and no active bleeding  noted on discharge.   DISCHARGE LABORATORY DATA:  Sodium 142, potassium 3.6, chloride 104, CO2  30, glucose 117, BUN 15, creatinine 0.65, calcium 8.8. PT 19.1, INR 1.5.  WBC 8.6, hemoglobin 9.6, platelet 170.   Chest x-ray:  No effusion, no acute disease.      Ladell Pier, M.D.  Electronically Signed     NJ/MEDQ  D:  03/17/2007  T:  03/17/2007  Job:  [id number]   cc:   Cassell Clement, M.D.  Bernette Redbird, M.D.

## 2011-02-14 NOTE — Consult Note (Signed)
[name], PRISK NO.:  [id number]   MEDICAL RECORD NO.:  [id number]          PATIENT TYPE:  INP   LOCATION:  2108                         FACILITY:  [facility]   PHYSICIAN:  Bernette Redbird, M.D.   DATE OF BIRTH:  Mar 11, 1927   DATE OF CONSULTATION:  03/11/2007  DATE OF DISCHARGE:                                 CONSULTATION   REASON FOR CONSULTATION:  Dr. Su Hilt asked [place] to see this 77 year old female because of GI  bleeding.   HISTORY OF PRESENT ILLNESS:  [name] has been seen by me in the past for both endoscopy and  colonoscopy, although it has been many, many years since her last  endoscopy.   With that background, there is a remote history of peptic ulcer disease  approximately 30 years ago, with no recent recurrent dyspeptic symptoms  in the past year or so.   Last week, she was having significant knee pain and was apparently  started on prescription NSAIDS of some sort through her primary  physician's office.  She does not recall the name of the medication but  it does sound as though it was a nonsteroidal anti-inflammatory drug.  This morning around 2:00 a.m., she began having multiple tarry melenic  stools and then developed presyncope while on the commode this  afternoon.  She has not had any dyspeptic upper tract symptoms in  association with this.  Of note, the patient is on Coumadin for chronic  atrial fibrillation.  She is not on a daily aspirin, however.   She presented to Dr. Su Hilt' office where she was noted to have melenic  stool and was sent to the emergency room where her hemoglobin was 10.1,  BUN was 57 and INR is 2.8.   ALLERGIES:  CODEINE.   OUTPATIENT MEDICATIONS:  1. Digitek.  2. Neurontin.  3. Coumadin.  4. Metoprolol.  5. Zetia.  6. Metformin.  7. Cardizem.  8. Ocuvite.   OPERATIONS:  1. Remote hysterectomy with partial oophorectomy and appendectomy.  2. Cataract surgery.   CHRONIC MEDICAL ILLNESSES:  1. Type  2 diabetes.  2. Chronic atrial fibrillation.  3. Hypertension.  4. Transient CHF.  5. No history of MI or COPD.  6. No chronic CHF.   HABITS:  Remote smoking history.  Rare ethanol.   FAMILY HISTORY:  Ulcers in her father.   SOCIAL HISTORY:  Married and husband is at bedside.   REVIEW OF SYSTEMS:  Tends to have a daily but somewhat constipated, hard  bowel movement, otherwise no real lower tract symptoms.  As mentioned  above, no upper tract symptoms.   PHYSICAL EXAMINATION:  GENERAL:  The patient is pale, but her skin is  warm and she is in no acute distress.  Heart rate about 110, irregular,  blood pressure 102/67.  CHEST:  Clear to auscultation.  HEART:  Rapid irregular rhythm, but otherwise is normal.  ABDOMEN:  Somewhat adipose, but nontender and without guarding or  masses.  I did not do rectal because she just had a melenic stool by  report.   LABORATORY DATA:  Pertinent labs include hemoglobin 10.1, BUN 57, INR  2.8.   IMPRESSION:  Upper GI bleed with moderate hemodynamic instability  probably secondary to NSAID induced gastric ulceration, with bleeding  magnified by virtue of the fact that the patient is on Coumadin.   PLAN:  1. PPI therapy.  2. Endoscopic evaluation later this evening once the Endo unit is      available, and hopefully after we have had chance to get at least      one unit of FFP transfused into the patient although that is not      essential prior to endoscopy.  3. I do favor temporarily reversing the patient's Coumadin by use of 2      units of FFP which will help with her blood pressure and      hemodynamic stability, but also will help temporarily reverse the      anticoagulation.   We appreciate the opportunity to have seen this patient in consultation  with you.           ______________________________  Bernette Redbird, M.D.     RB/MEDQ  D:  03/11/2007  T:  03/12/2007  Job:  [id number]   cc:   Antony Madura, M.D.

## 2011-02-14 NOTE — Op Note (Signed)
[name], [name] NO.:  [id number]   MEDICAL RECORD NO.:  [id number]          PATIENT TYPE:  INP   LOCATION:  2108                         FACILITY:  [facility]   PHYSICIAN:  Fayrene Fearing L. Malon Kindle., M.D.DATE OF BIRTH:  11/17/1926   DATE OF PROCEDURE:  03/12/2007  DATE OF DISCHARGE:                               OPERATIVE REPORT   PROCEDURE:  Esophagogastroduodenoscopy with control of bleeding.   MEDICATIONS:  Fentanyl 62.5 mcg, Versed 6 mg IV, epinephrine 1:10,000  2.5 mL.   INDICATION:  GI bleed in a woman on Coumadin with atrial fibrillation.  Pressure dropped and this was done acutely.   DESCRIPTION OF PROCEDURE:  The procedure had been explained to the  patient and consent obtained.  In the left lateral decubitus position,  the Pentax scope was inserted.  The esophagus was normal, no varices,  and no signs of bleeding.  A small amount of streaky blood was seen in  the stomach.  No gastric ulcers on the forward and retroflex view.  Upon  entering the duodenum a large amount of blood was seen.  This was  irrigated and near the junction of the duodenal bulb and second duodenum  was a small ulcer with a visible vessel with was actively bleeding.  It  was more of an ooze than an arterial pumper.  We irrigated this  vigorously and it continued to bleed.  We injected around it with 2 mL  of epinephrine with the bleeding diminishing.  I then placed to  Endoclips and it felt like with the second Endoclip the bleeding site  was completely obliterated.  I then went distal to the ulcer and  injected another 0.5 mL of epinephrine.  At the termination of the  procedure her per pressure had come up and there was no longer any  active bleeding.  The scope was withdrawn and the initial findings were  confirmed.   ASSESSMENT:  A bleeding duodenal ulcer controlled with epinephrine  injection and Endoclip.   PLAN:  Will continue the patient on Protonix, keep her n.p.o. for  now,  and probably had the surgeons follow her in the morning.  Correct her  anticoagulation and transfuse to keep her hemoglobin up near 10.           ______________________________  Llana Aliment. Malon Kindle., M.D.     Waldron Session  D:  03/12/2007  T:  03/12/2007  Job:  [id number]   cc:   Antony Madura, M.D.  Bernette Redbird, M.D.

## 2011-02-17 NOTE — Op Note (Signed)
[name], [name] NO.:  [id number]   MEDICAL RECORD NO.:  [id number]          PATIENT TYPE:  INP   LOCATION:  2015                         FACILITY:  [facility]   PHYSICIAN:  Cassell Clement, M.D. DATE OF BIRTH:  06/17/1927   DATE OF PROCEDURE:  05/16/2005  DATE OF DISCHARGE:                                 OPERATIVE REPORT   PROCEDURE PERFORMED:  Electrical cardioversion.   HISTORY:  This 77 year old woman was admitted with atrial fibrillation which  had failed to respond to outpatient measures.  After informed consent, the  patient was given 120 mg of IV pentothal by Dr. Katrinka Blazing.  After a suitable  anesthesia had been induced, the patient was given a single 100 joule shock  and converted to normal sinus rhythm.  She tolerated it well.  There were no  neurologic complications.   IMPRESSION:  Successful DC cardioversion.           ______________________________  Cassell Clement, M.D.     TB/MEDQ  D:  05/17/2005  T:  05/17/2005  Job:  [id number]

## 2011-02-17 NOTE — Discharge Summary (Signed)
[name], [name] NO.:  [id number]   MEDICAL RECORD NO.:  [id number]          PATIENT TYPE:  INP   LOCATION:  2015                         FACILITY:  [facility]   PHYSICIAN:  Cassell Clement, M.D. DATE OF BIRTH:  Mar 10, 1927   DATE OF ADMISSION:  05/15/2005  DATE OF DISCHARGE:  05/17/2005                                 DISCHARGE SUMMARY   FINAL DIAGNOSES:  1.  Atrial fibrillation, resolved.  2.  Hypertensive cardiovascular disease.  3.  Peripheral neuropathy.  4.  Diabetes mellitus.  5.  Chronic Coumadin anticoagulation.  6.  Hypercholesterolemia.   OPERATIONS PERFORMED:  Direct current cardioversion on May 16, 2005.   HISTORY:  This pleasant 77 year old woman with a history of hypertension and  previous atrial fibrillation and diabetes and hypercholesterolemia was  admitted as an urgent patient on May 15, 2005 Dr. Reyes Ivan because of  atrial fibrillation with rapid ventricular response. She had been scheduled  to have an outpatient cardioversion later in the week but, because of her  increasing symptoms, was brought for inpatient cardioversion. She has been  having episodes of shortness of breath and diaphoresis with any exertional  activity and she has been aware of rapid heart action.  She has also had  periods of bradycardia according to her blood pressure cuff at home. The  patient has a past history of peripheral neuropathy, hypercholesterolemia,  diabetes and hypertension.   PHYSICAL EXAMINATION:  Physical examination showed stable vital signs with  blood pressure 110/70, heart rate 110-130 in atrial fibrillation.  The heart  reveals no gallop. Abdomen is soft, nontender. The extremities show trace  edema. Pedal pulses were normal.   HOSPITAL COURSE:  The patient was placed on IV Cardizem. It was noted that  her anticoagulation had been satisfactory. On the morning after admission,  the patient underwent successful DC cardioversion using 100  watt seconds  introverted sinus rhythm. Her digoxin level on admission had been low at 0.6  although she had been on Lanoxin 0.125 mg b.i.d. and we adjusted her  digoxin.  In the hospital, we observed her for 24 hours and, by the morning  of May 17, 2005, she was stable and ready to be discharged home. She is  still having episodes of diaphoresis, even though she is back in sinus  rhythm. Her blood pressure, however, has been stable with a blood pressure  of 139/88 at discharge, pulse is 71 and sinus rhythm with borderline  __________ block.  Overnight, her weight has increased from 185 to 189 and  on discharge she will be sent home on a higher dose of diuretics, 40 mg  Lasix daily instead of 30. At the morning of discharge, her INR was up to  4.0 and we are adjusting her Coumadin downward.   The patient is being discharged improved in sinus rhythm around the  following regimen.   DISCHARGE MEDICATIONS:  1.  Tegretol 200 mg t.i.d.  2.  Toprol XL 100 mg daily.  3.  Warfarin 5 mg, 1 on Monday, Wednesday and Friday; 1/2 other days.  4.  Lanoxin 0.25  mg daily.  5.  Furosemide 40 mg daily.  6.  Cordarone 200 mg, 1/2 tablet daily.  7.  Zetia 10 mg daily.  8.  Metformin 500 mg daily.  9.  Ocuvite 1 daily.  10. B-complex multivitamin 1 daily.  11. Calcium with vitamin D 1 twice a day.   FOLLOW UP:  She was seen in my office in one week for office visit, protime,  BMET and an EKG.   CONDITION ON DISCHARGE:  Improved.           ______________________________  Cassell Clement, M.D.     TB/MEDQ  D:  05/17/2005  T:  05/17/2005  Job:  [id number]   cc:   Dr. Silas Flood  [facility] Dept of Neurology

## 2011-02-17 NOTE — H&P (Signed)
NAMEALAYIAH, FONTES NO.:  [id number]   MEDICAL RECORD NO.:  [id number]          PATIENT TYPE:  INP   LOCATION:  2015                         FACILITY:  [facility]   PHYSICIAN:  Elmore Guise., M.D.DATE OF BIRTH:  Feb 05, 1927   DATE OF ADMISSION:  05/15/2005  DATE OF DISCHARGE:                                HISTORY & PHYSICAL   INDICATION FOR ADMISSION:  Atrial fibrillation with rapid ventricular  response   HISTORY OF PRESENT ILLNESS:  The patient is very pleasant 77 year old white  female with past medical history of hypertension, atrial fibrillation,  diabetes mellitus who presents for evaluation of increased dyspnea,  decreased exertional tolerance and easy diaphoresis. The patient reports  that she has been having off-and-on trouble with her heart skipping for  some time. She has been seen by Dr. Ronny Flurry for treatment of her  atrial fibrillation since 1999. She underwent DC cardioversion which was  successful at that time; however, she had a recurrence and has been in off-  and-on atrial fibrillation at least for the last couple of office visits.  She states now she gets short of breath and sweaty with any exertional  activity. She also notes her heart skipping, going fast at times, and when  she checks it on her blood pressure cuff, she has had it register as low as  50-60. She denies any fever, chills, nausea, vomiting, or diarrhea. No  significant cough. No orthopnea, no PND, no lower extremity edema.   CURRENT MEDICATIONS:  1.  Tegretol 200 milligrams 3 tablets once a day.  2.  Toprol XL 50 milligrams b.i.d.  3.  Coumadin 2.5 milligrams x 4 days, 5 milligrams x 3.  4.  Lanoxin 0.125 milligrams b.i.d.  5.  Calcium with vitamin D 2 pills daily.  6.  __________ 2 pills daily.  7.  B complex once daily.  8.  Lasix 30 mg once daily.  9.  Amiodarone 100 milligrams daily.  10. Ocuvite once daily.  11. Centrum.  12. Zetia 10 milligrams daily.  13. Metformin 500 milligrams daily.  14. Cardizem CD 120 milligrams daily.   ALLERGIES:  CODEINE.   FAMILY HISTORY:  Positive for heart disease and emphysema in her father.  Mother died from childbirth. Has sister with cerebral hemorrhage. Another  sister died from breast cancer and heart disease.   SOCIAL HISTORY:  She is married. She does not drink. She has not smoked in  over 35 years.   PAST SURGICAL HISTORY:  Includes hysterectomy and appendectomy in the past.   PHYSICAL EXAMINATION:  VITAL SIGNS:  Her blood pressure is 110/70, heart  rate is 110-130 in atrial fibrillation.  GENERAL:  She is very pleasant, elderly-appearing white female, alert and  oriented x4. No acute distress. She is mildly diaphoretic.  HEENT:  Normal.  NECK:  Supple. No lymphadenopathy, 2+ carotids. No JVD and no bruits.  LUNGS: Clear.  HEART:  Irregular regular, tachycardiac as above.  ABDOMEN:  Abdomen is soft, nontender, nondistended. No rebound or guarding.  EXTREMITIES:  Extremities were warm with 2+ pulses and no  significant edema.   EKG today shows atrial fibrillation with rapid ventricular response, heart  rate of 128 per minute, with nonspecific ST and T-wave changes.   Lab work is pending at time of dictation.   IMPRESSION:  1.  Atrial fibrillation with rapid ventricular response.   PLAN:  Will admit to the hospital for rate control with IV Cardizem.  The  patient will be scheduled for DC cardioversion in the morning.  She has been  on adequate anticoagulation since March 29, 2005, with INRs of 2.3, 2.7, and  3.8 done each month.   I discussed this procedure with her at length, and the patient is agreeable  to proceed. She was initially scheduled for outpatient cardioversion this  Friday; however, her symptoms seem to be worsening. Therefore, we will admit  for inpatient evaluation and treatment. Dr. Patty Sermons will assume care in  the morning.      Elmore Guise., M.D.   Electronically Signed     TWK/MEDQ  D:  05/15/2005  T:  05/15/2005  Job:  [id number]

## 2011-02-17 NOTE — Op Note (Signed)
[name], [name]               ACCOUNT NO.:  [id number]   MEDICAL RECORD NO.:  [id number]          PATIENT TYPE:  AMB   LOCATION:  DAY                          FACILITY:  [facility]   PHYSICIAN:  Georges Lynch. Gioffre, M.D.DATE OF BIRTH:  07/01/1927   DATE OF PROCEDURE:  12/07/2004  DATE OF DISCHARGE:                                 OPERATIVE REPORT   PREOPERATIVE DIAGNOSIS:  1.  Severe impingement syndrome of the right shoulder.  2.  Partial thickness tear of the rotator cuff tendon, right shoulder.   POSTOPERATIVE DIAGNOSIS:  1.  Severe impingement syndrome of the right shoulder.  2.  Partial thickness tear of the rotator cuff tendon, right shoulder.   OPERATION PERFORMED:  1.  Open decompression, right shoulder by performing a partial      acromionectomy and acromioplasty.  2.  Repair of the partial thickness tear of the rotator cuff tendon, right      shoulder utilizing a TissueMend 3 x 3 double thickness tendon graft.   SURGEON:  Georges Lynch. Darrelyn Hillock, M.D.   ASSISTANT:  Ebbie Ridge. Paitsel, P.A.   ANESTHESIA:  General.   DESCRIPTION OF PROCEDURE:  Under general anesthesia, routine orthopedic prep  and drape of the right shoulder was carried out.  The patient had 1 g of IV  Ancef.  An incision was made over the anterior aspect of the right shoulder.  Bleeders were identified and cauterized.  At this time the deltoid tendon  was separated from the acromion in the usual fashion.  Following this, I  then went down and excised the subdeltoid bursa.  It was chronically  thickened and inflamed.  I then examined the rotator cuff.  The area where  she had the severe impingement literally shredded the cuff like a cheese  grater.  There was not a total full thickness tear but there was a partial  tear so I went ahead and reinforced that with a 3 x 3 double thickness  TissueMend graft using Ethibond sutures.  Following this, I bone waxed the  undersurface of the acromion.  Then I inserted some  thrombin soaked Gelfoam  in the subacromial space for hemostasis purposes.  Following this, I then  reattached the deltoid tendon and  muscle in the usual fashion with #1 Ethibond.  In the muscle I utilized 0  Vicryl for closure.  Subcutaneous was closed with 0 Vicryl, skin with metal  staples.  I did inject about 10 mL of 0.5% Marcaine with epinephrine into  the shoulder per se.  Sterile Neosporin dressing was applied and she was  placed in a shoulder immobilizer.      RAG/MEDQ  D:  12/07/2004  T:  12/07/2004  Job:  [id number]   cc:   Cassell Clement, M.D.  [address]  [place]  [place] [address]  Fax: [phone]

## 2011-03-07 ENCOUNTER — Encounter: Payer: Self-pay | Admitting: *Deleted

## 2011-03-08 ENCOUNTER — Ambulatory Visit (INDEPENDENT_AMBULATORY_CARE_PROVIDER_SITE_OTHER): Payer: Medicare Other | Admitting: Cardiology

## 2011-03-08 ENCOUNTER — Ambulatory Visit (INDEPENDENT_AMBULATORY_CARE_PROVIDER_SITE_OTHER): Payer: Medicare Other | Admitting: *Deleted

## 2011-03-08 ENCOUNTER — Encounter: Payer: Self-pay | Admitting: Cardiology

## 2011-03-08 DIAGNOSIS — E119 Type 2 diabetes mellitus without complications: Secondary | ICD-10-CM

## 2011-03-08 DIAGNOSIS — I10 Essential (primary) hypertension: Secondary | ICD-10-CM

## 2011-03-08 DIAGNOSIS — I119 Hypertensive heart disease without heart failure: Secondary | ICD-10-CM | POA: Insufficient documentation

## 2011-03-08 DIAGNOSIS — Z7901 Long term (current) use of anticoagulants: Secondary | ICD-10-CM

## 2011-03-08 DIAGNOSIS — I350 Nonrheumatic aortic (valve) stenosis: Secondary | ICD-10-CM | POA: Insufficient documentation

## 2011-03-08 DIAGNOSIS — I4891 Unspecified atrial fibrillation: Secondary | ICD-10-CM

## 2011-03-08 DIAGNOSIS — I359 Nonrheumatic aortic valve disorder, unspecified: Secondary | ICD-10-CM

## 2011-03-08 DIAGNOSIS — R0989 Other specified symptoms and signs involving the circulatory and respiratory systems: Secondary | ICD-10-CM

## 2011-03-08 NOTE — Progress Notes (Signed)
[name] Date of Birth:  [birth year] [facility] / [facility] [address]   [address] [place], [place]  [address] [phone]           Fax   [phone]  History of Present Illness: This pleasant 77 year old woman is seen for a scheduled followup office visit.  She has a history of chronic atrial fibrillation and is on Coumadin.  She has a history of diabetes mellitus as well as essential hypertension and hyperlipidemia.  She is intolerant of statin drugs she has a history of known aortic stenosis and her last echocardiogram was in September 2010 at which time her main systolic gradient across the aortic valve was 13.  The patient does not have any history of ischemic heart disease and she underwent a nuclear stress test preoperatively for breast cancer on 01/29/07 which showed normal left ventricular function with an ejection fraction of 67% and no evidence of ischemia.  Since last visit the patient has also been diagnosed with hypothyroidism and she is also seen Dr. Wynetta Emery who diagnosed arthritis of the back and prescribed physical therapy exercises which have helped her a lot.  She does them before she gets out of the bed in the morning and her mobility has improved significantly.  Current Outpatient Prescriptions  Medication Sig Dispense Refill  . calcium carbonate (OS-CAL) 600 MG TABS Take 1,200 mg by mouth daily.        . digoxin (LANOXIN) 0.25 MG tablet Take 250 mcg by mouth daily.        Marland Kitchen diltiazem (CARDIZEM CD) 240 MG 24 hr capsule TAKE ONE CAPSULE BY MOUTH EVERY DAY  90 capsule  3  . furosemide (LASIX) 40 MG tablet Take 40 mg by mouth 2 (two) times daily.        Marland Kitchen gabapentin (NEURONTIN) 300 MG capsule Daily. As directed      . levothyroxine (SYNTHROID, LEVOTHROID) 50 MCG tablet Take 1 tablet by mouth Daily.      . metoprolol (TOPROL-XL) 100 MG 24 hr tablet Take 100 mg by mouth daily.        . Multiple Vitamins-Minerals (OCUVITE PO) Take 1 tablet by mouth 2 (two)  times daily.        Marland Kitchen NIASPAN 500 MG CR tablet Take 1 tablet by mouth Daily.      . simvastatin (ZOCOR) 20 MG tablet Take 20 mg by mouth at bedtime.        Marland Kitchen warfarin (COUMADIN) 5 MG tablet Take 5 mg by mouth. As directed by the Coumadin Clinic         Allergies  Allergen Reactions  . Codeine   . Lipitor (Atorvastatin Calcium)   . Pravachol     Patient Active Problem List  Diagnoses  . HYPERLIPIDEMIA  . ATRIAL FIBRILLATION, CHRONIC  . BRADYCARDIA-TACHYCARDIA SYNDROME  . Essential hypertension  . Diabetes mellitus  . Aortic stenosis    History  Smoking status  . Former Smoker  . Types: Cigarettes  . Quit date: 03/06/1961  Smokeless tobacco  . Not on file    History  Alcohol Use No    Family History  Problem Relation Age of Onset  . Heart disease Father   . Emphysema Father   . Stroke Sister     cerebral hemorrhage  . Breast cancer Sister   . Heart disease Sister   . Heart failure Father   . Coronary artery disease Father   . Heart failure Sister  Review of Systems: Constitutional: no fever chills diaphoresis or fatigue or change in weight.  Head and neck: no hearing loss, no epistaxis, no photophobia or visual disturbance. Respiratory: No cough, shortness of breath or wheezing. Cardiovascular: No chest pain peripheral edema, palpitations. Gastrointestinal: No abdominal distention, no abdominal pain, no change in bowel habits hematochezia or melena. Genitourinary: No dysuria, no frequency, no urgency, no nocturia. Musculoskeletal:No arthralgias, no back pain, no gait disturbance or myalgias. Neurological: No dizziness, no headaches, no numbness, no seizures, no syncope, no weakness, no tremors. Hematologic: No lymphadenopathy, no easy bruising. Psychiatric: No confusion, no hallucinations, no sleep disturbance.    Physical Exam: Filed Vitals:   03/08/11 1113  BP: 120/60  Pulse: 64  The general appearance reveals a well-developed well-nourished  woman in no distress.Pupils equal and reactive.   Extraocular Movements are full.  There is no scleral icterus.  The mouth and pharynx are normal.  The neck is supple.  The carotids reveal no bruits.  The jugular venous pressure is normal.  The thyroid is not enlarged.  There is no lymphadenopathy.The chest is clear to percussion and auscultation. There are no rales or rhonchi. Expansion of the chest is symmetrical.  The heart reveals a grade 2/6 systolic murmur at the base.The abdomen is soft and nontender. Bowel sounds are normal. The liver and spleen are not enlarged. There Are no abdominal masses. There are no bruits.The pedal pulses are good.  There is no phlebitis or edema.  There is no cyanosis or clubbing.Strength is normal and symmetrical in all extremities.  There is no lateralizing weakness.  There are no sensory deficits.The skin is warm and dry.  There is no rash.   Assessment / Plan:  Continue present medication.  Recheck in 3 months for followup office visit

## 2011-03-08 NOTE — Assessment & Plan Note (Signed)
The patient has a history of chronic atrial fibrillation.  She is on long-term Coumadin.  Her INRs are therapeutic.  She has not had any TIA symptoms or strokes.

## 2011-03-08 NOTE — Assessment & Plan Note (Signed)
The patient has a history of aortic stenosis her last echocardiogram was on 06/16/09 and at that time showed a mean systolic gradient of 13 mm mercury cluster aortic valve.  The patient has not been expressing any symptoms of congestive heart failure.

## 2011-03-17 ENCOUNTER — Other Ambulatory Visit: Payer: Self-pay | Admitting: Cardiology

## 2011-03-17 NOTE — Telephone Encounter (Signed)
escribe request  

## 2011-03-28 ENCOUNTER — Encounter: Payer: Medicare Other | Admitting: *Deleted

## 2011-03-28 ENCOUNTER — Ambulatory Visit (INDEPENDENT_AMBULATORY_CARE_PROVIDER_SITE_OTHER): Payer: Medicare Other | Admitting: *Deleted

## 2011-03-28 DIAGNOSIS — I4891 Unspecified atrial fibrillation: Secondary | ICD-10-CM

## 2011-03-28 DIAGNOSIS — Z7901 Long term (current) use of anticoagulants: Secondary | ICD-10-CM

## 2011-04-17 ENCOUNTER — Telehealth: Payer: Self-pay | Admitting: Cardiology

## 2011-04-17 ENCOUNTER — Other Ambulatory Visit: Payer: Self-pay | Admitting: Cardiology

## 2011-04-17 NOTE — Telephone Encounter (Signed)
CORRECTED QUANTITY, SHOULD HAVE BEEN # 30

## 2011-04-17 NOTE — Telephone Encounter (Signed)
Pharmacist states Pt needs niaspan 30 day supply. Pharmacist would like to talk to a nurse.

## 2011-04-25 ENCOUNTER — Ambulatory Visit (INDEPENDENT_AMBULATORY_CARE_PROVIDER_SITE_OTHER): Payer: Medicare Other | Admitting: *Deleted

## 2011-04-25 DIAGNOSIS — Z7901 Long term (current) use of anticoagulants: Secondary | ICD-10-CM

## 2011-04-25 DIAGNOSIS — I4891 Unspecified atrial fibrillation: Secondary | ICD-10-CM

## 2011-05-09 ENCOUNTER — Ambulatory Visit (INDEPENDENT_AMBULATORY_CARE_PROVIDER_SITE_OTHER): Payer: Medicare Other | Admitting: *Deleted

## 2011-05-09 DIAGNOSIS — I4891 Unspecified atrial fibrillation: Secondary | ICD-10-CM

## 2011-05-09 DIAGNOSIS — Z7901 Long term (current) use of anticoagulants: Secondary | ICD-10-CM

## 2011-05-09 LAB — POCT INR: INR: 2.5

## 2011-05-15 ENCOUNTER — Other Ambulatory Visit: Payer: Self-pay | Admitting: Cardiology

## 2011-05-15 DIAGNOSIS — I119 Hypertensive heart disease without heart failure: Secondary | ICD-10-CM

## 2011-05-23 ENCOUNTER — Ambulatory Visit (INDEPENDENT_AMBULATORY_CARE_PROVIDER_SITE_OTHER): Payer: Medicare Other | Admitting: *Deleted

## 2011-05-23 DIAGNOSIS — Z7901 Long term (current) use of anticoagulants: Secondary | ICD-10-CM

## 2011-05-23 DIAGNOSIS — I4891 Unspecified atrial fibrillation: Secondary | ICD-10-CM

## 2011-05-23 LAB — POCT INR: INR: 2.6

## 2011-06-02 ENCOUNTER — Encounter: Payer: Self-pay | Admitting: *Deleted

## 2011-06-08 ENCOUNTER — Other Ambulatory Visit: Payer: Self-pay | Admitting: Internal Medicine

## 2011-06-08 DIAGNOSIS — Z1231 Encounter for screening mammogram for malignant neoplasm of breast: Secondary | ICD-10-CM

## 2011-06-12 ENCOUNTER — Encounter: Payer: Self-pay | Admitting: Internal Medicine

## 2011-06-12 ENCOUNTER — Ambulatory Visit (INDEPENDENT_AMBULATORY_CARE_PROVIDER_SITE_OTHER): Payer: Medicare Other | Admitting: *Deleted

## 2011-06-12 DIAGNOSIS — I4891 Unspecified atrial fibrillation: Secondary | ICD-10-CM

## 2011-06-12 DIAGNOSIS — I495 Sick sinus syndrome: Secondary | ICD-10-CM

## 2011-06-12 LAB — PACEMAKER DEVICE OBSERVATION
BRDY-0002RV: 65 {beats}/min
BRDY-0004RV: 120 {beats}/min
BRDY-0005RV: 50 {beats}/min
DEVICE MODEL PM: 2036664
RV LEAD IMPEDENCE PM: 540 Ohm

## 2011-06-12 NOTE — Progress Notes (Signed)
Pacer check in clinic  

## 2011-06-14 ENCOUNTER — Ambulatory Visit (INDEPENDENT_AMBULATORY_CARE_PROVIDER_SITE_OTHER): Payer: Medicare Other | Admitting: *Deleted

## 2011-06-14 ENCOUNTER — Encounter: Payer: Self-pay | Admitting: Cardiology

## 2011-06-14 ENCOUNTER — Ambulatory Visit (INDEPENDENT_AMBULATORY_CARE_PROVIDER_SITE_OTHER): Payer: Medicare Other | Admitting: Cardiology

## 2011-06-14 VITALS — BP 124/64 | HR 69 | Wt 156.0 lb

## 2011-06-14 DIAGNOSIS — I350 Nonrheumatic aortic (valve) stenosis: Secondary | ICD-10-CM

## 2011-06-14 DIAGNOSIS — I359 Nonrheumatic aortic valve disorder, unspecified: Secondary | ICD-10-CM

## 2011-06-14 DIAGNOSIS — I4891 Unspecified atrial fibrillation: Secondary | ICD-10-CM

## 2011-06-14 DIAGNOSIS — E785 Hyperlipidemia, unspecified: Secondary | ICD-10-CM

## 2011-06-14 DIAGNOSIS — E7800 Pure hypercholesterolemia, unspecified: Secondary | ICD-10-CM

## 2011-06-14 DIAGNOSIS — E119 Type 2 diabetes mellitus without complications: Secondary | ICD-10-CM

## 2011-06-14 DIAGNOSIS — Z7901 Long term (current) use of anticoagulants: Secondary | ICD-10-CM

## 2011-06-14 LAB — POCT INR: INR: 3.6

## 2011-06-14 NOTE — Assessment & Plan Note (Signed)
No thromboembolic events.  No symptoms of congestive heart failure.  No awareness of tachycardia palpitations.

## 2011-06-14 NOTE — Progress Notes (Signed)
[name] Date of Birth:  1927/03/30 [facility] / [facility] [address]   [address] [place], [place]  [address] [phone]           Fax   [phone]  History of Present Illness: This pleasant 77 year old woman is seen for a scheduled followup office visit.  She has a history of chronic atrial fibrillation with tachybradycardia syndrome and is on long-term Coumadin.  She has a pacemaker in place functioning well.  She has a history of diabetes mellitus as well as a history of hyperlipidemia and essential hypertension.  She has been intolerant of statin drugs.  She's had a history of aortic stenosis and had an echocardiogram 12/29/10 which showed that her aortic stenosis was stable.  She also has a history of hypothyroidism.  She's had chronic back problems and is followed by neurosurgery.  She does not have any history of ischemic heart disease and had a normal Cardiolite stress test on 01/21/07.  Current Outpatient Prescriptions  Medication Sig Dispense Refill  . calcium carbonate (OS-CAL) 600 MG TABS Take 1,200 mg by mouth daily.        . digoxin (LANOXIN) 0.25 MG tablet 1/2 daily or as direceted  120 tablet  3  . diltiazem (CARDIZEM CD) 240 MG 24 hr capsule TAKE ONE CAPSULE BY MOUTH EVERY DAY  90 capsule  3  . furosemide (LASIX) 40 MG tablet Take 40 mg by mouth 2 (two) times daily. 40mg  alt with 80mg       . gabapentin (NEURONTIN) 300 MG capsule Daily. Taking 4 daily      . metFORMIN (GLUMETZA) 500 MG (MOD) 24 hr tablet Take 1,000 mg by mouth 2 (two) times daily with a meal.        . metoprolol (LOPRESSOR) 100 MG tablet TAKE ONE TABLET BY MOUTH TWICE DAILY  180 tablet  3  . Multiple Vitamins-Minerals (OCUVITE PO) Take 1 tablet by mouth 2 (two) times daily.        Marland Kitchen NIASPAN 500 MG CR tablet TAKE ONE TABLET BY MOUTH AT BEDTIME  3 each  11  . simvastatin (ZOCOR) 20 MG tablet TAKE ONE TABLET BY MOUTH AT BEDTIME  90 tablet  11  . warfarin (COUMADIN) 5 MG tablet Take 5 mg  by mouth. As directed by the Coumadin Clinic         Allergies  Allergen Reactions  . Codeine   . Lipitor (Atorvastatin Calcium)   . Pravachol     Patient Active Problem List  Diagnoses  . HYPERLIPIDEMIA  . ATRIAL FIBRILLATION, CHRONIC  . BRADYCARDIA-TACHYCARDIA SYNDROME  . Essential hypertension  . Diabetes mellitus  . Aortic stenosis    History  Smoking status  . Former Smoker  . Types: Cigarettes  . Quit date: 03/06/1961  Smokeless tobacco  . Not on file    History  Alcohol Use No    Family History  Problem Relation Age of Onset  . Heart disease Father   . Emphysema Father   . Stroke Sister     cerebral hemorrhage  . Breast cancer Sister   . Heart disease Sister   . Heart failure Father   . Coronary artery disease Father   . Heart failure Sister     Review of Systems: Constitutional: no fever chills diaphoresis or fatigue or change in weight.  Head and neck: no hearing loss, no epistaxis, no photophobia or visual disturbance. Respiratory: No cough, shortness of breath or wheezing. Cardiovascular: No  chest pain peripheral edema, palpitations. Gastrointestinal: No abdominal distention, no abdominal pain, no change in bowel habits hematochezia or melena. Genitourinary: No dysuria, no frequency, no urgency, no nocturia. Musculoskeletal:No arthralgias, no back pain, no gait disturbance or myalgias. Neurological: No dizziness, no headaches, no numbness, no seizures, no syncope, no weakness, no tremors. Hematologic: No lymphadenopathy, no easy bruising. Psychiatric: No confusion, no hallucinations, no sleep disturbance.    Physical Exam: Filed Vitals:   06/14/11 1101  BP: 124/64  Pulse: 69   The general appearance reveals a well-developed well-nourished woman in no distress.Pupils equal and reactive.   Extraocular Movements are full.  There is no scleral icterus.  The mouth and pharynx are normal.  The neck is supple.  The carotids reveal no bruits.  The  jugular venous pressure is normal.  The thyroid is not enlarged.  There is no lymphadenopathy.  The chest is clear to percussion and auscultation. There are no rales or rhonchi. Expansion of the chest is symmetrical.  The precordium is quiet.  The first heart sound is normal.  The second heart sound is physiologically split.  There is no  gallop rub or click. There is a grade 2/6 systolic ejection murmur of aortic stenosis at the base. There is no abnormal lift or heave.The abdomen is soft and nontender. Bowel sounds are normal. The liver and spleen are not enlarged. There Are no abdominal masses. There are no bruits.  The pedal pulses are good.  There is no phlebitis or edema.  There is no cyanosis or clubbing.  Strength is normal and symmetrical in all extremities.  There is no lateralizing weakness.  There are no sensory deficits.  The skin is warm and dry.  There is no rash.      Assessment / Plan: Continue present medication.  Continue close followup with Dr. Su Hilt who will follow for diabetes.  Recheck here for cardiac evaluation in 3 months.  Continue monthly prothrombin times at the Coumadin clinic.

## 2011-06-14 NOTE — Assessment & Plan Note (Signed)
Her last echocardiogram was 12/29/10 which showed stable mild to moderate aortic valve stenosis.  He is not having any symptoms from the aortic stenosis.

## 2011-06-14 NOTE — Assessment & Plan Note (Signed)
The patient is on simvastatin and Niaspan for her dyslipidemia.  She is watching her diet carefully.  She tries to exercise regularly by doing a lot of outdoor yard work.

## 2011-06-15 ENCOUNTER — Encounter: Payer: Self-pay | Admitting: Cardiology

## 2011-07-03 LAB — PROTIME-INR: INR: 1.3

## 2011-07-03 LAB — GLUCOSE, CAPILLARY: Glucose-Capillary: 120 — ABNORMAL HIGH

## 2011-07-12 ENCOUNTER — Ambulatory Visit (INDEPENDENT_AMBULATORY_CARE_PROVIDER_SITE_OTHER): Payer: Medicare Other | Admitting: *Deleted

## 2011-07-12 DIAGNOSIS — Z7901 Long term (current) use of anticoagulants: Secondary | ICD-10-CM

## 2011-07-12 DIAGNOSIS — I4891 Unspecified atrial fibrillation: Secondary | ICD-10-CM

## 2011-07-12 LAB — POCT INR: INR: 2.7

## 2011-07-19 LAB — CBC
HCT: 28.5 — ABNORMAL LOW
MCV: 93.1
Platelets: 170
RDW: 14.5 — ABNORMAL HIGH
WBC: 8.6

## 2011-07-19 LAB — BASIC METABOLIC PANEL
BUN: 15
Creatinine, Ser: 0.65
GFR calc non Af Amer: >60
Glucose, Bld: 117 — ABNORMAL HIGH
Potassium: 3.6

## 2011-07-19 LAB — PROTIME-INR: Prothrombin Time: 19.1 — ABNORMAL HIGH

## 2011-07-20 ENCOUNTER — Ambulatory Visit
Admission: RE | Admit: 2011-07-20 | Discharge: 2011-07-20 | Disposition: A | Discharge status: Home / Self Care | Payer: Medicare Other | Source: Ambulatory Visit | Attending: Internal Medicine | Admitting: Internal Medicine

## 2011-07-20 DIAGNOSIS — Z1231 Encounter for screening mammogram for malignant neoplasm of breast: Secondary | ICD-10-CM

## 2011-07-20 LAB — CROSSMATCH
ABO/RH(D): O POS
Antibody Screen: NEGATIVE
Antibody Screen: NEGATIVE

## 2011-07-20 LAB — PROTIME-INR
INR: 1.1
INR: 1.1
INR: 1.3
INR: 1.3
Prothrombin Time: 14.6
Prothrombin Time: 16.1 — ABNORMAL HIGH
Prothrombin Time: 16.9 — ABNORMAL HIGH
Prothrombin Time: 18.2 — ABNORMAL HIGH
Prothrombin Time: 30.8 — ABNORMAL HIGH

## 2011-07-20 LAB — BASIC METABOLIC PANEL
BUN: 11
BUN: 26 — ABNORMAL HIGH
CO2: 28
Calcium: 7.8 — ABNORMAL LOW
Calcium: 7.8 — ABNORMAL LOW
Calcium: 8.2 — ABNORMAL LOW
Chloride: 108
Chloride: 110
Chloride: 113 — ABNORMAL HIGH
Creatinine, Ser: 0.57
Creatinine, Ser: 0.68
GFR calc Af Amer: >60
GFR calc Af Amer: >60
GFR calc non Af Amer: >60
GFR calc non Af Amer: >60
GFR calc non Af Amer: >60
Glucose, Bld: 92
Glucose, Bld: 97
Potassium: 3.7
Potassium: 4.5
Sodium: 141

## 2011-07-20 LAB — PREPARE FRESH FROZEN PLASMA

## 2011-07-20 LAB — CBC
HCT: 18.5 — ABNORMAL LOW
HCT: 24.5 — ABNORMAL LOW
HCT: 26 — ABNORMAL LOW
HCT: 28.3 — ABNORMAL LOW
HCT: 30.1 — ABNORMAL LOW
Hemoglobin: 6.3 — CL
Hemoglobin: 8.4 — ABNORMAL LOW
Hemoglobin: 8.6 — ABNORMAL LOW
Hemoglobin: 8.7 — ABNORMAL LOW
Hemoglobin: 9.1 — ABNORMAL LOW
Hemoglobin: 9.6 — ABNORMAL LOW
Hemoglobin: 9.7 — ABNORMAL LOW
MCHC: 33.5
MCHC: 33.6
MCHC: 33.7
MCHC: 34.1
MCHC: 34.3
MCHC: 34.3
MCV: 89.1
MCV: 89.6
MCV: 89.8
MCV: 90.1
MCV: 90.3
MCV: 90.7
MCV: 91
MCV: 91.6
MCV: 92.1
Platelets: 109 — ABNORMAL LOW
Platelets: 110 — ABNORMAL LOW
Platelets: 133 — ABNORMAL LOW
Platelets: 150
Platelets: 158
Platelets: 182
Platelets: 208
Platelets: 96 — ABNORMAL LOW
RBC: 2.5 — ABNORMAL LOW
RBC: 2.64 — ABNORMAL LOW
RBC: 2.71 — ABNORMAL LOW
RBC: 2.83 — ABNORMAL LOW
RBC: 3.14 — ABNORMAL LOW
RDW: 13.2
RDW: 13.5
RDW: 14.1 — ABNORMAL HIGH
RDW: 14.4 — ABNORMAL HIGH
RDW: 14.5 — ABNORMAL HIGH
WBC: 10.3
WBC: 11.5 — ABNORMAL HIGH
WBC: 6.4
WBC: 6.5
WBC: 6.9
WBC: 6.9
WBC: 7.3
WBC: 9.2

## 2011-07-20 LAB — HEMOGLOBIN A1C: Hgb A1c MFr Bld: 6

## 2011-07-20 LAB — DIFFERENTIAL
Basophils Absolute: 0
Lymphocytes Relative: 11 — ABNORMAL LOW
Lymphs Abs: 1.2
Monocytes Absolute: 0.2
Neutro Abs: 8.9 — ABNORMAL HIGH

## 2011-07-20 LAB — OCCULT BLOOD X 1 CARD TO LAB, STOOL: Fecal Occult Bld: POSITIVE

## 2011-07-20 LAB — COMPREHENSIVE METABOLIC PANEL
Albumin: 3.1 — ABNORMAL LOW
BUN: 57 — ABNORMAL HIGH
Calcium: 8.8
Chloride: 106
Creatinine, Ser: 0.56
Total Bilirubin: 0.8

## 2011-08-09 ENCOUNTER — Ambulatory Visit (INDEPENDENT_AMBULATORY_CARE_PROVIDER_SITE_OTHER): Payer: Medicare Other | Admitting: *Deleted

## 2011-08-09 DIAGNOSIS — I4891 Unspecified atrial fibrillation: Secondary | ICD-10-CM

## 2011-08-09 DIAGNOSIS — Z7901 Long term (current) use of anticoagulants: Secondary | ICD-10-CM

## 2011-08-09 LAB — POCT INR: INR: 1.8

## 2011-08-12 ENCOUNTER — Other Ambulatory Visit: Payer: Self-pay | Admitting: Cardiology

## 2011-08-14 MED ORDER — METFORMIN HCL 1000 MG PO TABS: 1000.0000 mg | ORAL_TABLET | Freq: Two times a day (BID) | ORAL | Status: DC

## 2011-08-28 ENCOUNTER — Ambulatory Visit (INDEPENDENT_AMBULATORY_CARE_PROVIDER_SITE_OTHER): Payer: Medicare Other | Admitting: *Deleted

## 2011-08-28 ENCOUNTER — Encounter: Payer: Medicare Other | Admitting: *Deleted

## 2011-08-28 DIAGNOSIS — Z7901 Long term (current) use of anticoagulants: Secondary | ICD-10-CM

## 2011-08-28 DIAGNOSIS — I4891 Unspecified atrial fibrillation: Secondary | ICD-10-CM

## 2011-08-28 LAB — POCT INR: INR: 2.4

## 2011-08-30 ENCOUNTER — Other Ambulatory Visit: Payer: Self-pay | Admitting: Cardiology

## 2011-08-30 NOTE — Telephone Encounter (Signed)
Refilled furosemide

## 2011-09-11 ENCOUNTER — Encounter: Payer: Self-pay | Admitting: Cardiology

## 2011-09-11 ENCOUNTER — Encounter: Payer: Medicare Other | Admitting: Internal Medicine

## 2011-09-11 ENCOUNTER — Ambulatory Visit (INDEPENDENT_AMBULATORY_CARE_PROVIDER_SITE_OTHER): Payer: Medicare Other | Admitting: Cardiology

## 2011-09-11 VITALS — BP 126/78 | HR 66 | Ht 66.0 in | Wt 159.0 lb

## 2011-09-11 DIAGNOSIS — I350 Nonrheumatic aortic (valve) stenosis: Secondary | ICD-10-CM

## 2011-09-11 DIAGNOSIS — Z950 Presence of cardiac pacemaker: Secondary | ICD-10-CM

## 2011-09-11 DIAGNOSIS — I4891 Unspecified atrial fibrillation: Secondary | ICD-10-CM

## 2011-09-11 DIAGNOSIS — I359 Nonrheumatic aortic valve disorder, unspecified: Secondary | ICD-10-CM

## 2011-09-11 DIAGNOSIS — E7800 Pure hypercholesterolemia, unspecified: Secondary | ICD-10-CM

## 2011-09-11 DIAGNOSIS — E119 Type 2 diabetes mellitus without complications: Secondary | ICD-10-CM

## 2011-09-11 NOTE — Assessment & Plan Note (Signed)
The patient has not been aware of any palpitations showing a day.  Occasionally at night when she lies down she is aware that her heart is skipping.  She has not had any thromboembolic episodes and she remains therapeutic on her Coumadin

## 2011-09-11 NOTE — Patient Instructions (Signed)
Your physician recommends that you continue on your current medications as directed. Please refer to the Current Medication list given to you today. Your physician recommends that you schedule a follow-up appointment in: 3 month OV/ EKG

## 2011-09-11 NOTE — Progress Notes (Signed)
[name] Date of Birth:  1927/04/13 [facility] / [facility] [address]   [address] [place], [place]  [address] [phone]           Fax   [phone]  History of Present Illness: Is pleasant 77 year old woman is seen for a scheduled followup office visit.  She has a history of chronic atrial fibrillation with tachybradycardia syndrome and she has a functioning pacemaker.  She is on long-term Coumadin for her atrial fibrillation.  She also has a history of diabetes and essential hypertension and hyperlipidemia.  She has a history of aortic stenosis and had an echocardiogram on 12/29/10 that showed her [place] stenosis was stable.  She does not have any history of ischemic heart disease and she had a normal nuclear stress test on 01/21/07.  He has a recent history of hypothyroidism and Dr. Su Hilt has her on Synthroid and follows her medically.  Current Outpatient Prescriptions  Medication Sig Dispense Refill  . calcium carbonate (OS-CAL) 600 MG TABS Take 1,200 mg by mouth daily.        . digoxin (LANOXIN) 0.25 MG tablet 1/2 daily or as direceted  120 tablet  3  . diltiazem (CARDIZEM CD) 240 MG 24 hr capsule TAKE ONE CAPSULE BY MOUTH EVERY DAY  90 capsule  3  . furosemide (LASIX) 40 MG tablet TAKE ONE TABLET BY MOUTH ALTERNATING WITH TAKING TWO TABLETS BY MOUTH EVERY OTHER DAY  135 tablet  11  . gabapentin (NEURONTIN) 300 MG capsule Daily. Taking 4 daily      . levothyroxine (SYNTHROID, LEVOTHROID) 50 MCG tablet Take 50 mcg by mouth daily.        . metFORMIN (GLUCOPHAGE) 1000 MG tablet Take 1 tablet (1,000 mg total) by mouth 2 (two) times daily with a meal.  180 tablet  3  . metoprolol (LOPRESSOR) 100 MG tablet TAKE ONE TABLET BY MOUTH TWICE DAILY  180 tablet  3  . Multiple Vitamins-Minerals (OCUVITE PO) Take 1 tablet by mouth 2 (two) times daily.        Marland Kitchen NIASPAN 500 MG CR tablet TAKE ONE TABLET BY MOUTH AT BEDTIME  3 each  11  . simvastatin (ZOCOR) 20 MG tablet TAKE ONE  TABLET BY MOUTH AT BEDTIME  90 tablet  11  . warfarin (COUMADIN) 5 MG tablet Take 5 mg by mouth. As directed by the Coumadin Clinic         Allergies  Allergen Reactions  . Codeine   . Lipitor (Atorvastatin Calcium)   . Pravachol     Patient Active Problem List  Diagnoses  . HYPERLIPIDEMIA  . ATRIAL FIBRILLATION, CHRONIC  . BRADYCARDIA-TACHYCARDIA SYNDROME  . Essential hypertension  . Diabetes mellitus  . Aortic stenosis    History  Smoking status  . Former Smoker  . Types: Cigarettes  . Quit date: 03/06/1961  Smokeless tobacco  . Not on file    History  Alcohol Use No    Family History  Problem Relation Age of Onset  . Heart disease Father   . Emphysema Father   . Stroke Sister     cerebral hemorrhage  . Breast cancer Sister   . Heart disease Sister   . Heart failure Father   . Coronary artery disease Father   . Heart failure Sister     Review of Systems: Constitutional: no fever chills diaphoresis or fatigue or change in weight.  Head and neck: no hearing loss, no epistaxis, no photophobia or  visual disturbance. Respiratory: No cough, shortness of breath or wheezing. Cardiovascular: No chest pain peripheral edema, palpitations. Gastrointestinal: No abdominal distention, no abdominal pain, no change in bowel habits hematochezia or melena. Genitourinary: No dysuria, no frequency, no urgency, no nocturia. Musculoskeletal:No arthralgias, no back pain, no gait disturbance or myalgias. Neurological: No dizziness, no headaches, no numbness, no seizures, no syncope, no weakness, no tremors. Hematologic: No lymphadenopathy, no easy bruising. Psychiatric: No confusion, no hallucinations, no sleep disturbance.    Physical Exam: Filed Vitals:   09/11/11 0956  BP: 126/78  Pulse: 66   general appearance reveals a well-developed well-nourished woman in no distress.Pupils equal and reactive.   Extraocular Movements are full.  There is no scleral icterus.  The mouth  and pharynx are normal.  The neck is supple.  The carotids reveal no bruits.  The jugular venous pressure is normal.  The thyroid is not enlarged.  There is no lymphadenopathy.  The chest is clear to percussion and auscultation. There are no rales or rhonchi. Expansion of the chest is symmetrical.  Heart reveals a grade 2/6 harsh systolic ejection murmur at the base.  No diastolic murmur.  No gallop or rub.The abdomen is soft and nontender. Bowel sounds are normal. The liver and spleen are not enlarged. There Are no abdominal masses. There are no bruits.  The pedal pulses are good.  There is no phlebitis or edema.  There is no cyanosis or clubbing. Strength is normal and symmetrical in all extremities.  There is no lateralizing weakness.  There are no sensory deficits.  The skin is warm and dry.  There is no rash.    Assessment / Plan: Continue on same medication.  Recheck in 3 months for office visit and EKG.  Dr. Su Hilt checks her lipids etc.

## 2011-09-11 NOTE — Assessment & Plan Note (Signed)
The patient is not having any symptoms referable to her aortic stenosis.

## 2011-09-11 NOTE — Assessment & Plan Note (Signed)
The patient is on metformin and simvastatin.  She is not having any hypoglycemic episodes.

## 2011-09-25 ENCOUNTER — Encounter: Payer: Medicare Other | Admitting: *Deleted

## 2011-09-27 ENCOUNTER — Other Ambulatory Visit: Payer: Self-pay | Admitting: Cardiology

## 2011-10-02 ENCOUNTER — Ambulatory Visit (INDEPENDENT_AMBULATORY_CARE_PROVIDER_SITE_OTHER): Payer: Medicare Other | Admitting: *Deleted

## 2011-10-02 DIAGNOSIS — Z7901 Long term (current) use of anticoagulants: Secondary | ICD-10-CM

## 2011-10-02 DIAGNOSIS — I4891 Unspecified atrial fibrillation: Secondary | ICD-10-CM

## 2011-10-23 ENCOUNTER — Ambulatory Visit (INDEPENDENT_AMBULATORY_CARE_PROVIDER_SITE_OTHER): Payer: Medicare Other | Admitting: *Deleted

## 2011-10-23 DIAGNOSIS — I4891 Unspecified atrial fibrillation: Secondary | ICD-10-CM

## 2011-10-23 DIAGNOSIS — Z7901 Long term (current) use of anticoagulants: Secondary | ICD-10-CM

## 2011-10-23 LAB — POCT INR: INR: 2

## 2011-11-09 ENCOUNTER — Encounter: Payer: Medicare Other | Admitting: Internal Medicine

## 2011-11-09 ENCOUNTER — Encounter: Payer: Self-pay | Admitting: Internal Medicine

## 2011-11-09 ENCOUNTER — Ambulatory Visit (INDEPENDENT_AMBULATORY_CARE_PROVIDER_SITE_OTHER): Payer: Medicare Other | Admitting: Internal Medicine

## 2011-11-09 VITALS — BP 124/66 | HR 70 | Resp 18 | Ht 66.0 in | Wt 158.0 lb

## 2011-11-09 DIAGNOSIS — I4891 Unspecified atrial fibrillation: Secondary | ICD-10-CM

## 2011-11-09 DIAGNOSIS — I495 Sick sinus syndrome: Secondary | ICD-10-CM

## 2011-11-09 LAB — PACEMAKER DEVICE OBSERVATION
BRDY-0004RV: 120 {beats}/min
BRDY-0005RV: 50 {beats}/min
DEVICE MODEL PM: 2036664
RV LEAD THRESHOLD: 0.75 V

## 2011-11-09 NOTE — Assessment & Plan Note (Signed)
Normal pacemaker function See Pace Art report No changes today  

## 2011-11-09 NOTE — Assessment & Plan Note (Signed)
Stable No change required today  

## 2011-11-09 NOTE — Progress Notes (Signed)
PCP:  Lorenda Peck, MD, MD  The patient presents today for routine electrophysiology followup.  Since last being seen in our clinic, the patient reports doing very well.  She remains active despite her age.  Today, she denies symptoms of palpitations, chest pain, shortness of breath, orthopnea, PND, lower extremity edema, dizziness, presyncope, syncope, or neurologic sequela.  The patient feels that she is tolerating medications without difficulties and is otherwise without complaint today.   Past Medical History  Diagnosis Date  . Hypertension     essential  . Diabetes mellitus   . CHF (congestive heart failure)     compensated  . Hyperlipidemia   . Permanent atrial fibrillation     on coumadin  . Tachycardia-bradycardia syndrome     with single chamber ventricular pacemaker  . Hypercholesteremia     with intolerance to statin therapy  . Aortic stenosis   . History of peptic ulcer disease   . Peripheral neuropathy   . Thrombocytopenia   . Anemia associated with acute blood loss 03/11/2007   . Gastrointestinal bleed 03/11/2007   . Duodenal ulcer 03/11/2007   . Thyroid disease    Past Surgical History  Procedure Date  . Shoulder surgery 12/07/2004  . Pacemaker insertion 06/18/2008    PPM-Single Chamber System V Lead Fluoro Guidance  / [employer]CRM Safe Sheath 90F / [employer]CRM Zephyr XL SR 5626 / Verneita Griffes Transmitter 3116 / [employer]CRM Tendril SDX [id number] / Signed By: Lady Deutscher  MD On 06/18/2008 2:07:53 PM  . Breast biopsy 02/22/2007     left  . Cardioversion 05/16/2005    Electrical cardioversion   . Esophagogastroduodenoscopy 03/12/2007     Esophagogastroduodenoscopy with control of bleeding    Current Outpatient Prescriptions  Medication Sig Dispense Refill  . calcium carbonate (OS-CAL) 600 MG TABS Take 1,200 mg by mouth daily.        . digoxin (LANOXIN) 0.25 MG tablet 1/2 daily or as direceted  120 tablet  3  . diltiazem (CARDIZEM CD) 240 MG 24 hr  capsule TAKE ONE CAPSULE BY MOUTH EVERY DAY  90 capsule  3  . furosemide (LASIX) 40 MG tablet TAKE ONE TABLET BY MOUTH ALTERNATING WITH TAKING TWO TABLETS BY MOUTH EVERY OTHER DAY  135 tablet  11  . gabapentin (NEURONTIN) 300 MG capsule Daily. Taking 4 daily      . levothyroxine (SYNTHROID, LEVOTHROID) 50 MCG tablet Take 50 mcg by mouth daily.        . metFORMIN (GLUCOPHAGE) 1000 MG tablet Take 1 tablet (1,000 mg total) by mouth 2 (two) times daily with a meal.  180 tablet  3  . metoprolol (LOPRESSOR) 100 MG tablet TAKE ONE TABLET BY MOUTH TWICE DAILY  180 tablet  3  . Multiple Vitamins-Minerals (OCUVITE PO) Take 1 tablet by mouth 2 (two) times daily.        Marland Kitchen NIASPAN 500 MG CR tablet TAKE ONE TABLET BY MOUTH AT BEDTIME  3 each  11  . simvastatin (ZOCOR) 20 MG tablet TAKE ONE TABLET BY MOUTH AT BEDTIME  90 tablet  11  . warfarin (COUMADIN) 5 MG tablet Take 1 tablet (5 mg total) by mouth as directed.  20 tablet  3    Allergies  Allergen Reactions  . Aspirin Other (See Comments)    Gi bleeding  . Codeine   . Lipitor (Atorvastatin Calcium)   . Pravachol     History   Social History  . Marital Status: Married  Spouse Name: N/A    Number of Children: N/A  . Years of Education: N/A   Occupational History  . Not on file.   Social History Main Topics  . Smoking status: Former Smoker    Types: Cigarettes    Quit date: 03/06/1961  . Smokeless tobacco: Not on file  . Alcohol Use: No  . Drug Use: No  . Sexually Active: Not on file   Other Topics Concern  . Not on file   Social History Narrative  . No narrative on file    Family History  Problem Relation Age of Onset  . Heart disease Father   . Emphysema Father   . Stroke Sister     cerebral hemorrhage  . Breast cancer Sister   . Heart disease Sister   . Heart failure Father   . Coronary artery disease Father   . Heart failure Sister     Physical Exam: Filed Vitals:   11/09/11 1039  BP: 124/66  Pulse: 70  Resp:  18  Height: 5\' 6"  (1.676 m)  Weight: 158 lb (71.668 kg)    GEN- The patient is well appearing, alert and oriented x 3 today.   Head- normocephalic, atraumatic Eyes-  Sclera clear, conjunctiva pink Ears- hearing intact Oropharynx- clear Neck- supple, no JVP Lymph- no cervical lymphadenopathy Lungs- Clear to ausculation bilaterally, normal work of breathing Chest- pacemaker pocket is well healed Heart- irregular rate and rhythm, 2/6 SEM LUSB (early peaking) GI- soft, NT, ND, + BS Extremities- no clubbing, cyanosis, or edema  Pacemaker interrogation- reviewed in detail today,  See PACEART report  Assessment and Plan:

## 2011-11-09 NOTE — Patient Instructions (Signed)
Your physician wants you to follow-up in: 12 months with Dr. Johney Frame. You will receive a reminder letter in the mail two months in advance. If you  don't receive a letter, please call our office to schedule the follow-up appointment.  Your physician wants you to follow-up in: the device clinic with Belenda Cruise in 6 months.  You will receive a reminder letter in the mail two months in advance. If you don't receive a letter, please call our office to schedule the follow-up appointment.

## 2011-11-15 ENCOUNTER — Other Ambulatory Visit: Payer: Self-pay | Admitting: Cardiology

## 2011-11-20 ENCOUNTER — Ambulatory Visit (INDEPENDENT_AMBULATORY_CARE_PROVIDER_SITE_OTHER): Payer: Medicare Other | Admitting: *Deleted

## 2011-11-20 DIAGNOSIS — Z7901 Long term (current) use of anticoagulants: Secondary | ICD-10-CM

## 2011-11-20 DIAGNOSIS — I4891 Unspecified atrial fibrillation: Secondary | ICD-10-CM

## 2011-12-11 ENCOUNTER — Encounter: Payer: Self-pay | Admitting: Cardiology

## 2011-12-11 ENCOUNTER — Ambulatory Visit (INDEPENDENT_AMBULATORY_CARE_PROVIDER_SITE_OTHER): Payer: Medicare Other | Admitting: Cardiology

## 2011-12-11 VITALS — BP 110/68 | HR 63 | Ht 65.0 in | Wt 157.0 lb

## 2011-12-11 DIAGNOSIS — I359 Nonrheumatic aortic valve disorder, unspecified: Secondary | ICD-10-CM

## 2011-12-11 DIAGNOSIS — E7800 Pure hypercholesterolemia, unspecified: Secondary | ICD-10-CM

## 2011-12-11 DIAGNOSIS — I350 Nonrheumatic aortic (valve) stenosis: Secondary | ICD-10-CM

## 2011-12-11 DIAGNOSIS — I4891 Unspecified atrial fibrillation: Secondary | ICD-10-CM

## 2011-12-11 DIAGNOSIS — E039 Hypothyroidism, unspecified: Secondary | ICD-10-CM

## 2011-12-11 DIAGNOSIS — I119 Hypertensive heart disease without heart failure: Secondary | ICD-10-CM

## 2011-12-11 DIAGNOSIS — E119 Type 2 diabetes mellitus without complications: Secondary | ICD-10-CM

## 2011-12-11 NOTE — Assessment & Plan Note (Signed)
Patient has history of diabetes mellitus.  She has diabetic neuropathy.  She takes gabapentin 300 mg once in the morning and 3 pills before bedtime.  Her diabetic neuropathy bothers her mainly at night.  She has an appointment to see her neurologist at [facility] later today

## 2011-12-11 NOTE — Assessment & Plan Note (Signed)
The patient has chronic atrial fibrillation and is on Coumadin.  She has not been experiencing any hematochezia or melena.  She's had no thromboembolic episodes.  She's not having any symptoms of CHF.

## 2011-12-11 NOTE — Assessment & Plan Note (Signed)
The patient has a history of aortic stenosis.  Her last echocardiogram in March 2012 showed an ejection fraction of 50-55% and she had mild aortic stenosis.  He has not been having any symptoms from her aortic stenosis.

## 2011-12-11 NOTE — Patient Instructions (Signed)
Your physician recommends that you continue on your current medications as directed. Please refer to the Current Medication list given to you today.  Your physician recommends that you schedule a follow-up appointment in: 3 month ov 

## 2011-12-11 NOTE — Progress Notes (Signed)
[name] Date of Birth:  10-16-1926 [facility] [address] [place], [place]  [address] [phone]  Fax   [phone]  HPI: This pleasant 78 year old woman is seen for a scheduled 3 month followup office visit.  She has a history of established atrial fibrillation.  She also has a history of diabetes with diabetic neuropathy.  She has a history of high blood pressure and hypothyroidism.  She has a history of aortic stenosis.  Her last echocardiogram on 12/29/10 showed that her aortic stenosis was stable.  She does not have any history of ischemic heart disease and she had a nuclear Cardiolite stress test on 01/21/07 which showed no evidence of ischemia.  She has tachybradycardia syndrome and has a functioning pacemaker.  She is on long-term Coumadin for her atrial fibrillation. Current Outpatient Prescriptions  Medication Sig Dispense Refill  . calcium carbonate (OS-CAL) 600 MG TABS Take 1,200 mg by mouth daily.        . digoxin (LANOXIN) 0.25 MG tablet 1/2 daily or as direceted  120 tablet  3  . diltiazem (CARDIZEM CD) 240 MG 24 hr capsule TAKE ONE CAPSULE BY MOUTH EVERY DAY  90 capsule  1  . furosemide (LASIX) 40 MG tablet TAKE ONE TABLET BY MOUTH ALTERNATING WITH TAKING TWO TABLETS BY MOUTH EVERY OTHER DAY  135 tablet  11  . gabapentin (NEURONTIN) 300 MG capsule Daily. Taking 4 daily      . levothyroxine (SYNTHROID, LEVOTHROID) 50 MCG tablet Take 50 mcg by mouth daily.        . metFORMIN (GLUCOPHAGE) 1000 MG tablet Take 1 tablet (1,000 mg total) by mouth 2 (two) times daily with a meal.  180 tablet  3  . metoprolol (LOPRESSOR) 100 MG tablet TAKE ONE TABLET BY MOUTH TWICE DAILY  180 tablet  3  . Multiple Vitamins-Minerals (OCUVITE PO) Take 1 tablet by mouth 2 (two) times daily.        Marland Kitchen NIASPAN 500 MG CR tablet TAKE ONE TABLET BY MOUTH AT BEDTIME  3 each  11  . simvastatin (ZOCOR) 20 MG tablet TAKE ONE TABLET BY MOUTH AT BEDTIME  90 tablet  11  . warfarin  (COUMADIN) 5 MG tablet Take 1 tablet (5 mg total) by mouth as directed.  20 tablet  3  . amoxicillin (AMOXIL) 500 MG capsule as directed.        Allergies  Allergen Reactions  . Aspirin Other (See Comments)    Gi bleeding  . Codeine   . Lipitor (Atorvastatin Calcium)   . Pravachol     Patient Active Problem List  Diagnoses  . HYPERLIPIDEMIA  . ATRIAL FIBRILLATION, CHRONIC  . BRADYCARDIA-TACHYCARDIA SYNDROME  . Essential hypertension  . Diabetes mellitus  . Aortic stenosis    History  Smoking status  . Former Smoker  . Types: Cigarettes  . Quit date: 03/06/1961  Smokeless tobacco  . Not on file    History  Alcohol Use No    Family History  Problem Relation Age of Onset  . Heart disease Father   . Emphysema Father   . Stroke Sister     cerebral hemorrhage  . Breast cancer Sister   . Heart disease Sister   . Heart failure Father   . Coronary artery disease Father   . Heart failure Sister     Review of Systems: The patient denies any heat or cold intolerance.  No weight gain or weight loss.  The patient denies  headaches or blurry vision.  There is no cough or sputum production.  The patient denies dizziness.  There is no hematuria or hematochezia.  The patient denies any muscle aches or arthritis.  The patient denies any rash.  The patient denies frequent falling or instability.  There is no history of depression or anxiety.  All other systems were reviewed and are negative.   Physical Exam: Filed Vitals:   12/11/11 1014  BP: 110/68  Pulse: 63   general appearance reveals an alert elderly woman in no acute distress.Pupils equal and reactive.   Extraocular Movements are full.  There is no scleral icterus.  The mouth and pharynx are normal.  The neck is supple.  The carotids reveal no bruits.  The jugular venous pressure is normal.  The thyroid is not enlarged.  There is no lymphadenopathy.  The chest is clear to percussion and auscultation. There are no rales or  rhonchi. Expansion of the chest is symmetrical.  The heart reveals a soft systolic ejection murmur at the base.  No diastolic murmur.  No gallop.  No rub.The abdomen is soft and nontender. Bowel sounds are normal. The liver and spleen are not enlarged. There Are no abdominal masses. There are no bruits.  The pedal pulses are good.  There is no phlebitis or edema.  There is no cyanosis or clubbing. Strength is normal and symmetrical in all extremities.  There is no lateralizing weakness.  There are no sensory deficits.  EKG today shows atrial fibrillation with a controlled ventricular response.  Occasional paced beats are seen    Assessment / Plan: Continue same medication.  Recheck in 3 months for followup office visit.  She will be having her complete annual physical examination soon with Dr. Su Hilt.

## 2012-01-01 ENCOUNTER — Ambulatory Visit (INDEPENDENT_AMBULATORY_CARE_PROVIDER_SITE_OTHER): Payer: Medicare Other | Admitting: *Deleted

## 2012-01-01 DIAGNOSIS — Z7901 Long term (current) use of anticoagulants: Secondary | ICD-10-CM

## 2012-01-01 DIAGNOSIS — I4891 Unspecified atrial fibrillation: Secondary | ICD-10-CM

## 2012-02-12 ENCOUNTER — Ambulatory Visit (INDEPENDENT_AMBULATORY_CARE_PROVIDER_SITE_OTHER): Payer: Medicare Other | Admitting: Pharmacist

## 2012-02-12 DIAGNOSIS — Z7901 Long term (current) use of anticoagulants: Secondary | ICD-10-CM

## 2012-02-12 DIAGNOSIS — I4891 Unspecified atrial fibrillation: Secondary | ICD-10-CM

## 2012-03-11 ENCOUNTER — Ambulatory Visit (INDEPENDENT_AMBULATORY_CARE_PROVIDER_SITE_OTHER): Payer: Medicare Other

## 2012-03-11 DIAGNOSIS — I4891 Unspecified atrial fibrillation: Secondary | ICD-10-CM

## 2012-03-11 DIAGNOSIS — Z7901 Long term (current) use of anticoagulants: Secondary | ICD-10-CM

## 2012-03-15 ENCOUNTER — Encounter: Payer: Self-pay | Admitting: Cardiology

## 2012-03-15 ENCOUNTER — Ambulatory Visit (INDEPENDENT_AMBULATORY_CARE_PROVIDER_SITE_OTHER): Payer: Medicare Other | Admitting: Cardiology

## 2012-03-15 VITALS — BP 110/60 | HR 66 | Ht 66.0 in | Wt 157.0 lb

## 2012-03-15 DIAGNOSIS — I359 Nonrheumatic aortic valve disorder, unspecified: Secondary | ICD-10-CM

## 2012-03-15 DIAGNOSIS — I4891 Unspecified atrial fibrillation: Secondary | ICD-10-CM

## 2012-03-15 DIAGNOSIS — I350 Nonrheumatic aortic (valve) stenosis: Secondary | ICD-10-CM

## 2012-03-15 DIAGNOSIS — I119 Hypertensive heart disease without heart failure: Secondary | ICD-10-CM

## 2012-03-15 DIAGNOSIS — E785 Hyperlipidemia, unspecified: Secondary | ICD-10-CM

## 2012-03-15 NOTE — Progress Notes (Signed)
Julien Girt Date of Birth:  [birth year] [facility] [address] [place], [place]  [address] [phone]  Fax   [phone]  HPI: This pleasant elderly woman is seen for a scheduled followup office visit.  She has a past history of established atrial fibrillation.  She has a history of aortic stenosis which on echocardiogram on 12/29/10 was stable and not severe.  She does not have any history of ischemic heart disease.  She had a normal nuclear stress test 01/21/07.  She has a history of tachybradycardia syndrome and has a functioning pacemaker.  Current Outpatient Prescriptions  Medication Sig Dispense Refill  . amoxicillin (AMOXIL) 500 MG capsule as directed.      . calcium carbonate (OS-CAL) 600 MG TABS Take 1,200 mg by mouth daily.        . digoxin (LANOXIN) 0.25 MG tablet 1/2 daily or as direceted  120 tablet  3  . diltiazem (CARDIZEM CD) 240 MG 24 hr capsule TAKE ONE CAPSULE BY MOUTH EVERY DAY  90 capsule  1  . furosemide (LASIX) 40 MG tablet TAKE ONE TABLET BY MOUTH ALTERNATING WITH TAKING TWO TABLETS BY MOUTH EVERY OTHER DAY  135 tablet  11  . gabapentin (NEURONTIN) 300 MG capsule Daily. Taking 4 daily      . levothyroxine (SYNTHROID, LEVOTHROID) 50 MCG tablet Take 50 mcg by mouth daily.        . metFORMIN (GLUCOPHAGE) 1000 MG tablet Take 1 tablet (1,000 mg total) by mouth 2 (two) times daily with a meal.  180 tablet  3  . metoprolol (LOPRESSOR) 100 MG tablet TAKE ONE TABLET BY MOUTH TWICE DAILY  180 tablet  3  . Multiple Vitamins-Minerals (OCUVITE PO) Take 1 tablet by mouth 2 (two) times daily.        Marland Kitchen NIASPAN 500 MG CR tablet TAKE ONE TABLET BY MOUTH AT BEDTIME  3 each  11  . simvastatin (ZOCOR) 20 MG tablet TAKE ONE TABLET BY MOUTH AT BEDTIME  90 tablet  11  . warfarin (COUMADIN) 5 MG tablet Take 1 tablet (5 mg total) by mouth as directed.  20 tablet  3    Allergies  Allergen Reactions  . Aspirin Other (See Comments)    Gi bleeding  . Codeine     . Lipitor (Atorvastatin Calcium)   . Pravachol     Patient Active Problem List  Diagnosis  . HYPERLIPIDEMIA  . ATRIAL FIBRILLATION, CHRONIC  . BRADYCARDIA-TACHYCARDIA SYNDROME  . Essential hypertension  . Diabetes mellitus  . Aortic stenosis    History  Smoking status  . Former Smoker  . Types: Cigarettes  . Quit date: 03/06/1961  Smokeless tobacco  . Not on file    History  Alcohol Use No    Family History  Problem Relation Age of Onset  . Heart disease Father   . Emphysema Father   . Stroke Sister     cerebral hemorrhage  . Breast cancer Sister   . Heart disease Sister   . Heart failure Father   . Coronary artery disease Father   . Heart failure Sister     Review of Systems: The patient denies any heat or cold intolerance.  No weight gain or weight loss.  The patient denies headaches or blurry vision.  There is no cough or sputum production.  The patient denies dizziness.  There is no hematuria or hematochezia.  The patient denies any muscle aches or arthritis.  The patient denies any  rash.  The patient denies frequent falling or instability.  There is no history of depression or anxiety.  All other systems were reviewed and are negative.   Physical Exam: Filed Vitals:   03/15/12 1049  BP: 110/60  Pulse: 66   the general appearance reveals a well-developed well-nourished woman in no distress.The head and neck exam reveals pupils equal and reactive.  Extraocular movements are full.  There is no scleral icterus.  The mouth and pharynx are normal.  The neck is supple.  The carotids reveal no bruits.  The jugular venous pressure is normal.  The  thyroid is not enlarged.  There is no lymphadenopathy.  The chest is clear to percussion and auscultation.  There are no rales or rhonchi.  Expansion of the chest is symmetrical.  The precordium is quiet.  The first heart sound is normal.  The second heart sound is physiologically split.  There is no  gallop rub or click.   There is a grade 2/6 systolic murmur at the base consistent with aortic stenosis.  The pulse is irregular in atrial fibrillation.  She has good pedal pulses.  There is no abnormal lift or heave.  The abdomen is soft and nontender.  The bowel sounds are normal.  The liver and spleen are not enlarged.  There are no abdominal masses.  There are no abdominal bruits.  Extremities reveal good pedal pulses.  There is no phlebitis or edema.  There is no cyanosis or clubbing.  Strength is normal and symmetrical in all extremities.  There is no lateralizing weakness.  There are no sensory deficits.  The skin is warm and dry.  There is no rash.      Assessment / Plan:  Continue same medication.  Recheck in 3 months for followup office visit and get a two-dimensional echocardiogram prior to her next visit to followup on her aortic valve stenosis.

## 2012-03-15 NOTE — Assessment & Plan Note (Signed)
The patient is doing well with her Coumadin.  She's had no TIA symptoms.

## 2012-03-15 NOTE — Assessment & Plan Note (Signed)
The patient is on simvastatin for hypercholesterolemia.  She's having no myalgias.  She does have peripheral neuropathy which bothers her only at night and she does take Neurontin with good results for the pain

## 2012-03-15 NOTE — Patient Instructions (Addendum)
Your physician recommends that you schedule a follow-up appointment in: 3 months, needs echo scheduled ahead of appointment  Your physician recommends that you continue on your current medications as directed. Please refer to the Current Medication list given to you today.

## 2012-03-26 ENCOUNTER — Other Ambulatory Visit: Payer: Self-pay | Admitting: Cardiology

## 2012-04-08 ENCOUNTER — Ambulatory Visit (INDEPENDENT_AMBULATORY_CARE_PROVIDER_SITE_OTHER): Payer: Medicare Other | Admitting: *Deleted

## 2012-04-08 DIAGNOSIS — I4891 Unspecified atrial fibrillation: Secondary | ICD-10-CM

## 2012-04-08 DIAGNOSIS — Z7901 Long term (current) use of anticoagulants: Secondary | ICD-10-CM

## 2012-04-10 ENCOUNTER — Other Ambulatory Visit: Payer: Self-pay | Admitting: Cardiology

## 2012-04-10 NOTE — Telephone Encounter (Signed)
Refilled niaspan 

## 2012-05-08 ENCOUNTER — Other Ambulatory Visit: Payer: Self-pay | Admitting: Cardiology

## 2012-05-13 ENCOUNTER — Ambulatory Visit (INDEPENDENT_AMBULATORY_CARE_PROVIDER_SITE_OTHER): Payer: Medicare Other | Admitting: Pharmacist

## 2012-05-13 ENCOUNTER — Ambulatory Visit (INDEPENDENT_AMBULATORY_CARE_PROVIDER_SITE_OTHER): Payer: Medicare Other | Admitting: *Deleted

## 2012-05-13 ENCOUNTER — Encounter: Payer: Self-pay | Admitting: Internal Medicine

## 2012-05-13 DIAGNOSIS — Z7901 Long term (current) use of anticoagulants: Secondary | ICD-10-CM

## 2012-05-13 DIAGNOSIS — I4891 Unspecified atrial fibrillation: Secondary | ICD-10-CM

## 2012-05-13 DIAGNOSIS — I495 Sick sinus syndrome: Secondary | ICD-10-CM

## 2012-05-13 LAB — PACEMAKER DEVICE OBSERVATION
BRDY-0002RV: 65 {beats}/min
DEVICE MODEL PM: 2036664
RV LEAD AMPLITUDE: 12 mv
RV LEAD IMPEDENCE PM: 526 Ohm
RV LEAD THRESHOLD: 0.75 V

## 2012-05-13 NOTE — Progress Notes (Signed)
PPM check 

## 2012-05-27 ENCOUNTER — Ambulatory Visit (HOSPITAL_COMMUNITY): Payer: Medicare Other | Attending: Internal Medicine

## 2012-05-27 DIAGNOSIS — I079 Rheumatic tricuspid valve disease, unspecified: Secondary | ICD-10-CM | POA: Insufficient documentation

## 2012-05-27 DIAGNOSIS — E119 Type 2 diabetes mellitus without complications: Secondary | ICD-10-CM | POA: Insufficient documentation

## 2012-05-27 DIAGNOSIS — Z87891 Personal history of nicotine dependence: Secondary | ICD-10-CM | POA: Insufficient documentation

## 2012-05-27 DIAGNOSIS — I10 Essential (primary) hypertension: Secondary | ICD-10-CM | POA: Insufficient documentation

## 2012-05-27 DIAGNOSIS — I080 Rheumatic disorders of both mitral and aortic valves: Secondary | ICD-10-CM | POA: Insufficient documentation

## 2012-05-27 DIAGNOSIS — I4891 Unspecified atrial fibrillation: Secondary | ICD-10-CM | POA: Insufficient documentation

## 2012-05-27 DIAGNOSIS — I359 Nonrheumatic aortic valve disorder, unspecified: Secondary | ICD-10-CM

## 2012-05-27 DIAGNOSIS — I350 Nonrheumatic aortic (valve) stenosis: Secondary | ICD-10-CM

## 2012-05-27 DIAGNOSIS — I379 Nonrheumatic pulmonary valve disorder, unspecified: Secondary | ICD-10-CM | POA: Insufficient documentation

## 2012-05-27 DIAGNOSIS — E785 Hyperlipidemia, unspecified: Secondary | ICD-10-CM | POA: Insufficient documentation

## 2012-05-27 NOTE — Progress Notes (Signed)
Echocardiogram performed.  

## 2012-06-04 ENCOUNTER — Telehealth: Payer: Self-pay | Admitting: *Deleted

## 2012-06-04 NOTE — Telephone Encounter (Signed)
Message copied by Burnell Blanks on Tue Jun 04, 2012  9:23 AM ------      Message from: Cassell Clement      Created: Tue May 28, 2012  9:26 PM       Echo is stable.  Good LV function. Aortic valve stenosis is only mild to moderate. CSD

## 2012-06-04 NOTE — Telephone Encounter (Signed)
Advised of echo results 

## 2012-06-07 ENCOUNTER — Encounter: Payer: Self-pay | Admitting: Cardiology

## 2012-06-07 ENCOUNTER — Ambulatory Visit (INDEPENDENT_AMBULATORY_CARE_PROVIDER_SITE_OTHER): Payer: Medicare Other | Admitting: Cardiology

## 2012-06-07 VITALS — BP 112/70 | HR 70 | Ht 66.0 in | Wt 157.0 lb

## 2012-06-07 DIAGNOSIS — E785 Hyperlipidemia, unspecified: Secondary | ICD-10-CM

## 2012-06-07 DIAGNOSIS — I350 Nonrheumatic aortic (valve) stenosis: Secondary | ICD-10-CM

## 2012-06-07 DIAGNOSIS — I359 Nonrheumatic aortic valve disorder, unspecified: Secondary | ICD-10-CM

## 2012-06-07 DIAGNOSIS — I4891 Unspecified atrial fibrillation: Secondary | ICD-10-CM

## 2012-06-07 NOTE — Assessment & Plan Note (Signed)
The patient has had no new symptoms from her atrial fibrillation.  She's not been aware of any racing of her heart.  She's had no TIA symptoms.  She is on long-term Coumadin.  She denies any excessive bruising or any significant bleeding.

## 2012-06-07 NOTE — Progress Notes (Signed)
[name] Date of Birth:  January 01, 1927 [facility] [address] [place], [place]  [address] [phone]  Fax   [phone]  HPI: This pleasant elderly woman is seen for a scheduled followup office visit. She has a past history of established atrial fibrillation. She has a history of aortic stenosis which on echocardiogram on 12/29/10 was stable and not severe. She does not have any history of ischemic heart disease. She had a normal nuclear stress test 01/21/07. She has a history of tachybradycardia syndrome and has a functioning pacemaker.  Since last visit she has been doing well.  No new cardiac symptoms.   Current Outpatient Prescriptions  Medication Sig Dispense Refill  . amoxicillin (AMOXIL) 500 MG capsule as directed.      . calcium carbonate (OS-CAL) 600 MG TABS Take 1,200 mg by mouth daily.        . digoxin (LANOXIN) 0.25 MG tablet 1/2 daily or as direceted  120 tablet  3  . diltiazem (CARDIZEM CD) 240 MG 24 hr capsule TAKE ONE CAPSULE BY MOUTH EVERY DAY  90 capsule  1  . furosemide (LASIX) 40 MG tablet TAKE ONE TABLET BY MOUTH ALTERNATING WITH TAKING TWO TABLETS BY MOUTH EVERY OTHER DAY  135 tablet  11  . gabapentin (NEURONTIN) 300 MG capsule Daily. 1 in the am and 3 in the pm      . levothyroxine (SYNTHROID, LEVOTHROID) 50 MCG tablet Take 50 mcg by mouth daily.        . metFORMIN (GLUCOPHAGE) 1000 MG tablet Take 1 tablet (1,000 mg total) by mouth 2 (two) times daily with a meal.  180 tablet  3  . metoprolol (LOPRESSOR) 100 MG tablet TAKE ONE TABLET BY MOUTH TWICE DAILY  180 tablet  3  . Multiple Vitamins-Minerals (OCUVITE PO) Take 1 tablet by mouth 2 (two) times daily.        Marland Kitchen NIASPAN 500 MG CR tablet TAKE ONE TABLET BY MOUTH AT BEDTIME  30 each  11  . simvastatin (ZOCOR) 20 MG tablet TAKE ONE TABLET BY MOUTH AT BEDTIME  90 tablet  11  . warfarin (COUMADIN) 5 MG tablet TAKE ONE TABLET BY MOUTH AS DIRECTED  30 tablet  2    Allergies  Allergen  Reactions  . Aspirin Other (See Comments)    Gi bleeding  . Codeine   . Lipitor (Atorvastatin Calcium)   . Pravachol     Patient Active Problem List  Diagnosis  . HYPERLIPIDEMIA  . ATRIAL FIBRILLATION, CHRONIC  . BRADYCARDIA-TACHYCARDIA SYNDROME  . Essential hypertension  . Diabetes mellitus  . Aortic stenosis    History  Smoking status  . Former Smoker  . Types: Cigarettes  . Quit date: 03/06/1961  Smokeless tobacco  . Not on file    History  Alcohol Use No    Family History  Problem Relation Age of Onset  . Heart disease Father   . Emphysema Father   . Stroke Sister     cerebral hemorrhage  . Breast cancer Sister   . Heart disease Sister   . Heart failure Father   . Coronary artery disease Father   . Heart failure Sister     Review of Systems: The patient denies any heat or cold intolerance.  No weight gain or weight loss.  The patient denies headaches or blurry vision.  There is no cough or sputum production.  The patient denies dizziness.  There is no hematuria or hematochezia.  The  patient denies any muscle aches or arthritis.  The patient denies any rash.  The patient denies frequent falling or instability.  There is no history of depression or anxiety.  All other systems were reviewed and are negative.   Physical Exam: Filed Vitals:   06/07/12 1146  BP: 112/70  Pulse: 70   the general appearance reveals a well-developed well-nourished woman in no distress.  The head and neck exam reveals pupils equal and reactive.  Extraocular movements are full.  There is no scleral icterus.  The mouth and pharynx are normal.  The neck is supple.  The carotids reveal no bruits.  The jugular venous pressure is normal.  The  thyroid is not enlarged.  There is no lymphadenopathy.  The chest is clear to percussion and auscultation.  There are no rales or rhonchi.  Expansion of the chest is symmetrical.  The precordium is quiet.  The first heart sound is normal.  The second  heart sound is physiologically split.  There is no  gallop rub or click.  There is a grade 2/6 systolic ejection murmur at the base.  There is no abnormal lift or heave.  The abdomen is soft and nontender.  The bowel sounds are normal.  The liver and spleen are not enlarged.  There are no abdominal masses.  There are no abdominal bruits.  Extremities reveal good pedal pulses.  There is no phlebitis or edema.  There is no cyanosis or clubbing.  Strength is normal and symmetrical in all extremities.  There is no lateralizing weakness.  There are no sensory deficits.  The skin is warm and dry.  There is no rash.      Assessment / Plan: Mild to moderate aortic stenosis.  Good LV function.  Chronic atrial fibrillation.  Continue on same medication and be rechecked in 4 months for followup office visit and EKG.  She has a functioning pacemaker for tachybradycardia syndrome followed in the EP clinic.

## 2012-06-07 NOTE — Assessment & Plan Note (Signed)
The patient has a history of mild diabetes mellitus.  She has not been having any hypoglycemic episodes.

## 2012-06-07 NOTE — Assessment & Plan Note (Signed)
The patient has a history of hyper-lipidemia.  This is monitored closely by her PCP

## 2012-06-07 NOTE — Patient Instructions (Addendum)
Your physician recommends that you continue on your current medications as directed. Please refer to the Current Medication list given to you today. Your physician wants you to follow-up in: 4 months You will receive a reminder letter in the mail two months in advance. If you don't receive a letter, please call our office to schedule the follow-up appointment.  

## 2012-06-07 NOTE — Assessment & Plan Note (Signed)
Patient has a history of aortic stenosis.  We repeated her echocardiogram on 05/27/12 and it showed only mild to moderate aortic stenosis as well as mild mitral regurgitation.  Her ejection fraction was 60-65%.  She has not had any cardinal symptoms from aortic stenosis

## 2012-06-10 ENCOUNTER — Other Ambulatory Visit: Payer: Self-pay | Admitting: Cardiology

## 2012-06-10 NOTE — Telephone Encounter (Signed)
Refilled simvastatin.

## 2012-06-13 ENCOUNTER — Other Ambulatory Visit: Payer: Self-pay | Admitting: Dermatology

## 2012-06-17 ENCOUNTER — Ambulatory Visit (INDEPENDENT_AMBULATORY_CARE_PROVIDER_SITE_OTHER): Payer: Medicare Other | Admitting: *Deleted

## 2012-06-17 DIAGNOSIS — I4891 Unspecified atrial fibrillation: Secondary | ICD-10-CM

## 2012-06-17 DIAGNOSIS — Z7901 Long term (current) use of anticoagulants: Secondary | ICD-10-CM

## 2012-06-19 ENCOUNTER — Other Ambulatory Visit: Payer: Self-pay | Admitting: Cardiology

## 2012-06-19 ENCOUNTER — Other Ambulatory Visit: Payer: Self-pay | Admitting: Internal Medicine

## 2012-06-19 DIAGNOSIS — Z1231 Encounter for screening mammogram for malignant neoplasm of breast: Secondary | ICD-10-CM

## 2012-06-19 NOTE — Telephone Encounter (Signed)
Refilled diltiazem 

## 2012-07-05 ENCOUNTER — Other Ambulatory Visit: Payer: Self-pay | Admitting: Cardiology

## 2012-07-15 ENCOUNTER — Ambulatory Visit (INDEPENDENT_AMBULATORY_CARE_PROVIDER_SITE_OTHER): Payer: Medicare Other

## 2012-07-15 DIAGNOSIS — Z23 Encounter for immunization: Secondary | ICD-10-CM

## 2012-07-29 ENCOUNTER — Ambulatory Visit
Admission: RE | Admit: 2012-07-29 | Discharge: 2012-07-29 | Disposition: A | Discharge status: Home / Self Care | Payer: Medicare Other | Source: Ambulatory Visit | Attending: Internal Medicine | Admitting: Internal Medicine

## 2012-07-29 ENCOUNTER — Ambulatory Visit (INDEPENDENT_AMBULATORY_CARE_PROVIDER_SITE_OTHER): Payer: Medicare Other | Admitting: *Deleted

## 2012-07-29 DIAGNOSIS — Z1231 Encounter for screening mammogram for malignant neoplasm of breast: Secondary | ICD-10-CM

## 2012-07-29 DIAGNOSIS — I4891 Unspecified atrial fibrillation: Secondary | ICD-10-CM

## 2012-07-29 DIAGNOSIS — Z7901 Long term (current) use of anticoagulants: Secondary | ICD-10-CM

## 2012-08-02 ENCOUNTER — Other Ambulatory Visit: Payer: Self-pay | Admitting: Cardiology

## 2012-09-02 ENCOUNTER — Ambulatory Visit (INDEPENDENT_AMBULATORY_CARE_PROVIDER_SITE_OTHER): Payer: Medicare Other | Admitting: *Deleted

## 2012-09-02 ENCOUNTER — Other Ambulatory Visit: Payer: Self-pay | Admitting: Pharmacist

## 2012-09-02 DIAGNOSIS — Z7901 Long term (current) use of anticoagulants: Secondary | ICD-10-CM

## 2012-09-02 DIAGNOSIS — I4891 Unspecified atrial fibrillation: Secondary | ICD-10-CM

## 2012-09-30 ENCOUNTER — Ambulatory Visit (INDEPENDENT_AMBULATORY_CARE_PROVIDER_SITE_OTHER): Payer: Medicare Other

## 2012-09-30 DIAGNOSIS — Z7901 Long term (current) use of anticoagulants: Secondary | ICD-10-CM

## 2012-09-30 DIAGNOSIS — I4891 Unspecified atrial fibrillation: Secondary | ICD-10-CM

## 2012-10-10 ENCOUNTER — Encounter: Payer: Self-pay | Admitting: Cardiology

## 2012-10-10 ENCOUNTER — Ambulatory Visit (INDEPENDENT_AMBULATORY_CARE_PROVIDER_SITE_OTHER): Payer: Medicare Other | Admitting: Cardiology

## 2012-10-10 VITALS — BP 108/70 | HR 76 | Ht 66.0 in | Wt 155.8 lb

## 2012-10-10 DIAGNOSIS — I4891 Unspecified atrial fibrillation: Secondary | ICD-10-CM

## 2012-10-10 DIAGNOSIS — I495 Sick sinus syndrome: Secondary | ICD-10-CM

## 2012-10-10 DIAGNOSIS — I359 Nonrheumatic aortic valve disorder, unspecified: Secondary | ICD-10-CM

## 2012-10-10 DIAGNOSIS — I350 Nonrheumatic aortic (valve) stenosis: Secondary | ICD-10-CM

## 2012-10-10 NOTE — Assessment & Plan Note (Signed)
The patient has a functioning pacemaker and she has underlying atrial fibrillation.  She has not been aware of any recent palpitations or tachycardia

## 2012-10-10 NOTE — Assessment & Plan Note (Signed)
She has not been having any hypoglycemic episodes.  Her diabetes and her lipids are followed by Dr. Su Hilt.

## 2012-10-10 NOTE — Progress Notes (Signed)
Julien Girt Date of Birth:  [birth year] [facility] [address] [place], [place]  [address] [phone]         Fax   [phone]  History of Present Illness: This pleasant 79 year old woman is seen for a scheduled followup office visit. She has a past history of established atrial fibrillation. She has a history of aortic stenosis which on echocardiogram on 12/29/10 was stable and not severe. She does not have any history of ischemic heart disease. She had a normal nuclear stress test 01/21/07. She has a history of tachybradycardia syndrome and has a functioning pacemaker. Since last visit she has been doing well. No new cardiac symptoms.  She has a history of diabetes mellitus and is followed by Dr. Su Hilt.  She has diabetic neuropathy and goes to [facility] once a year to see Dr. Conrad Mitiwanga.   Current Outpatient Prescriptions  Medication Sig Dispense Refill  . amoxicillin (AMOXIL) 500 MG capsule as directed.      . calcium carbonate (OS-CAL) 600 MG TABS Take 1,200 mg by mouth daily.        . digoxin (LANOXIN) 0.25 MG tablet TAKE ONE-HALF TABLET BY MOUTH EVERY DAY OR  AS  DIRECTED  120 tablet  2  . diltiazem (CARDIZEM CD) 240 MG 24 hr capsule TAKE ONE CAPSULE BY MOUTH EVERY DAY  90 capsule  3  . furosemide (LASIX) 40 MG tablet TAKE ONE TABLET BY MOUTH ALTERNATING WITH TAKING TWO TABLETS BY MOUTH EVERY OTHER DAY  135 tablet  11  . gabapentin (NEURONTIN) 300 MG capsule Daily. 1 in the am and 3 in the pm      . levothyroxine (SYNTHROID, LEVOTHROID) 50 MCG tablet Take 50 mcg by mouth daily.        Marland Kitchen lisinopril (PRINIVIL,ZESTRIL) 2.5 MG tablet Take 2.5 mg by mouth daily.      . metFORMIN (GLUCOPHAGE) 1000 MG tablet TAKE ONE TABLET BY MOUTH TWICE DAILY WITH MEALS  180 tablet  0  . metoprolol (LOPRESSOR) 100 MG tablet TAKE ONE TABLET BY MOUTH TWICE DAILY  180 tablet  3  . Multiple Vitamins-Minerals (OCUVITE PO) Take 1 tablet by mouth 2 (two) times daily.        Marland Kitchen  NIASPAN 500 MG CR tablet TAKE ONE TABLET BY MOUTH AT BEDTIME  30 each  11  . simvastatin (ZOCOR) 20 MG tablet TAKE ONE TABLET BY MOUTH AT BEDTIME  90 tablet  3  . warfarin (COUMADIN) 5 MG tablet TAKE ONE TABLET BY MOUTH AS DIRECTED  30 tablet  2    Allergies  Allergen Reactions  . Aspirin Other (See Comments)    Gi bleeding  . Codeine   . Lipitor (Atorvastatin Calcium)   . Pravachol     Patient Active Problem List  Diagnosis  . HYPERLIPIDEMIA  . ATRIAL FIBRILLATION, CHRONIC  . BRADYCARDIA-TACHYCARDIA SYNDROME  . Essential hypertension  . Diabetes mellitus  . Aortic stenosis    History  Smoking status  . Former Smoker  . Types: Cigarettes  . Quit date: 03/06/1961  Smokeless tobacco  . Not on file    History  Alcohol Use No    Family History  Problem Relation Age of Onset  . Heart disease Father   . Emphysema Father   . Stroke Sister     cerebral hemorrhage  . Breast cancer Sister   . Heart disease Sister   . Heart failure Father   . Coronary artery disease Father   .  Heart failure Sister     Review of Systems: Constitutional: no fever chills diaphoresis or fatigue or change in weight.  Head and neck: no hearing loss, no epistaxis, no photophobia or visual disturbance. Respiratory: No cough, shortness of breath or wheezing. Cardiovascular: No chest pain peripheral edema, palpitations. Gastrointestinal: No abdominal distention, no abdominal pain, no change in bowel habits hematochezia or melena. Genitourinary: No dysuria, no frequency, no urgency, no nocturia. Musculoskeletal:No arthralgias, no back pain, no gait disturbance or myalgias. Neurological: No dizziness, no headaches, no numbness, no seizures, no syncope, no weakness, no tremors. Hematologic: No lymphadenopathy, no easy bruising. Psychiatric: No confusion, no hallucinations, no sleep disturbance.    Physical Exam: Filed Vitals:   10/10/12 1400  BP: 108/70  Pulse: 76   the general appearance  reveals a alert elderly woman in no distress.Pupils equal and reactive.   Extraocular Movements are full.  There is no scleral icterus.  The mouth and pharynx are normal.  The neck is supple.  The carotids reveal no bruits.  The jugular venous pressure is normal.  The thyroid is not enlarged.  There is no lymphadenopathy.  The chest is clear to percussion and auscultation. There are no rales or rhonchi. Expansion of the chest is symmetrical.  Heart reveals a grade 2/6 systolic ejection murmur at the base.The abdomen is soft and nontender. Bowel sounds are normal. The liver and spleen are not enlarged. There Are no abdominal masses. There are no bruits.  The pedal pulses are good.  There is no phlebitis or edema.  There is no cyanosis or clubbing. Strength is normal and symmetrical in all extremities.  There is no lateralizing weakness.  There are no sensory deficits.  The skin is warm and dry.  There is no rash.  EKG shows atrial fibrillation with controlled ventricular response.  No pacer beats are seen.    Assessment / Plan: Continue on same medication.  She is doing very well.  Recheck in 4 months for followup office visit

## 2012-10-10 NOTE — Patient Instructions (Addendum)
Your physician recommends that you continue on your current medications as directed. Please refer to the Current Medication list given to you today.  Your physician wants you to follow-up in: 4 MONTHS.  You will receive a reminder letter in the mail two months in advance. If you don't receive a letter, please call our office to schedule the follow-up appointment.  

## 2012-10-10 NOTE — Assessment & Plan Note (Signed)
The patient has a history of mild aortic stenosis.  Her last echocardiogram 12/29/10 showed stable findings.  She has not been having any exertional chest pain or symptoms of congestive heart failure or exertional dizziness or syncope.

## 2012-10-10 NOTE — Assessment & Plan Note (Signed)
The patient is on long-term Coumadin for her chronic atrial fibrillation.  She has not been expressing any side effects from the Coumadin.  She has a remote history of GI bleeding secondary to nonsteroidal drugs but avoids those and has had no further GI bleeding.

## 2012-10-21 ENCOUNTER — Other Ambulatory Visit: Payer: Self-pay | Admitting: *Deleted

## 2012-10-21 MED ORDER — WARFARIN SODIUM 5 MG PO TABS: ORAL_TABLET | ORAL | Status: DC

## 2012-11-06 ENCOUNTER — Other Ambulatory Visit: Payer: Self-pay | Admitting: *Deleted

## 2012-11-06 MED ORDER — FUROSEMIDE 40 MG PO TABS: ORAL_TABLET | ORAL | Status: DC

## 2012-11-13 ENCOUNTER — Ambulatory Visit (INDEPENDENT_AMBULATORY_CARE_PROVIDER_SITE_OTHER): Payer: Medicare Other | Admitting: Internal Medicine

## 2012-11-13 ENCOUNTER — Ambulatory Visit (INDEPENDENT_AMBULATORY_CARE_PROVIDER_SITE_OTHER): Payer: Medicare Other | Admitting: *Deleted

## 2012-11-13 ENCOUNTER — Encounter: Payer: Self-pay | Admitting: Internal Medicine

## 2012-11-13 VITALS — BP 121/70 | HR 57 | Ht 66.0 in | Wt 159.0 lb

## 2012-11-13 DIAGNOSIS — I4891 Unspecified atrial fibrillation: Secondary | ICD-10-CM

## 2012-11-13 DIAGNOSIS — I10 Essential (primary) hypertension: Secondary | ICD-10-CM

## 2012-11-13 DIAGNOSIS — Z7901 Long term (current) use of anticoagulants: Secondary | ICD-10-CM

## 2012-11-13 DIAGNOSIS — I495 Sick sinus syndrome: Secondary | ICD-10-CM

## 2012-11-13 LAB — PACEMAKER DEVICE OBSERVATION
BATTERY VOLTAGE: 2.8 V
BRDY-0002RV: 65 {beats}/min
DEVICE MODEL PM: 2036664
RV LEAD IMPEDENCE PM: 545 Ohm
VENTRICULAR PACING PM: 21

## 2012-11-13 NOTE — Assessment & Plan Note (Signed)
On coumadin 

## 2012-11-13 NOTE — Assessment & Plan Note (Signed)
Stable No change required today  

## 2012-11-13 NOTE — Assessment & Plan Note (Signed)
Normal pacemaker function See Pace Art report No changes today  

## 2012-11-13 NOTE — Patient Instructions (Addendum)
Your physician wants you to follow-up in: 12 months with Dr Allred You will receive a reminder letter in the mail two months in advance. If you don't receive a letter, please call our office to schedule the follow-up appointment.  

## 2012-11-13 NOTE — Progress Notes (Signed)
PCP:  Lorenda Peck, MD Primary Cardiologist:  Dr Patty Sermons   The patient presents today for routine electrophysiology followup.  Since last being seen in our clinic, the patient reports doing very well.  She remains active despite her age.  Today, she denies symptoms of palpitations, chest pain, shortness of breath, orthopnea, PND, lower extremity edema, dizziness, presyncope, syncope, or neurologic sequela.  The patient feels that she is tolerating medications without difficulties and is otherwise without complaint today.   Past Medical History  Diagnosis Date  . Hypertension     essential  . Diabetes mellitus   . CHF (congestive heart failure)     compensated  . Hyperlipidemia   . Permanent atrial fibrillation     on coumadin  . Tachycardia-bradycardia syndrome     with single chamber ventricular pacemaker  . Hypercholesteremia     with intolerance to statin therapy  . Aortic stenosis   . History of peptic ulcer disease   . Peripheral neuropathy   . Thrombocytopenia   . Anemia associated with acute blood loss 03/11/2007   . Gastrointestinal bleed 03/11/2007   . Duodenal ulcer 03/11/2007   . Thyroid disease    Past Surgical History  Procedure Laterality Date  . Shoulder surgery  12/07/2004  . Pacemaker insertion  06/18/2008    PPM-Single Chamber System V Lead Fluoro Guidance  / [employer] Safe Sheath 79F / [employer] Zephyr XL SR 5626 / Verneita Griffes Transmitter [id number] / [employer] Tendril SDX [id number] / Signed By: Lady Deutscher  MD On 06/18/2008 2:07:53 PM  . Breast biopsy  02/22/2007     left  . Cardioversion  05/16/2005    Electrical cardioversion   . Esophagogastroduodenoscopy  03/12/2007     Esophagogastroduodenoscopy with control of bleeding    Current Outpatient Prescriptions  Medication Sig Dispense Refill  . amoxicillin (AMOXIL) 500 MG capsule as directed.      . calcium carbonate (OS-CAL) 600 MG TABS Take 1,200 mg by mouth daily.        . digoxin  (LANOXIN) 0.25 MG tablet TAKE ONE-HALF TABLET BY MOUTH EVERY DAY OR  AS  DIRECTED  120 tablet  2  . diltiazem (CARDIZEM CD) 240 MG 24 hr capsule TAKE ONE CAPSULE BY MOUTH EVERY DAY  90 capsule  3  . furosemide (LASIX) 40 MG tablet Take one tablet alternating with 2 tablets  135 tablet  11  . gabapentin (NEURONTIN) 300 MG capsule Daily. 1 in the am and 3 in the pm      . levothyroxine (SYNTHROID, LEVOTHROID) 50 MCG tablet Take 50 mcg by mouth daily.        Marland Kitchen lisinopril (PRINIVIL,ZESTRIL) 2.5 MG tablet Take 2.5 mg by mouth daily.      . metFORMIN (GLUCOPHAGE) 1000 MG tablet TAKE ONE TABLET BY MOUTH TWICE DAILY WITH MEALS  180 tablet  0  . metoprolol (LOPRESSOR) 100 MG tablet TAKE ONE TABLET BY MOUTH TWICE DAILY  180 tablet  3  . Multiple Vitamins-Minerals (OCUVITE PO) Take 1 tablet by mouth 2 (two) times daily.        Marland Kitchen NIASPAN 500 MG CR tablet TAKE ONE TABLET BY MOUTH AT BEDTIME  30 each  11  . simvastatin (ZOCOR) 20 MG tablet TAKE ONE TABLET BY MOUTH AT BEDTIME  90 tablet  3  . warfarin (COUMADIN) 5 MG tablet Take as directed by coumadin clinic  40 tablet  1   No current facility-administered medications for this visit.  Allergies  Allergen Reactions  . Aspirin Other (See Comments)    Gi bleeding  . Codeine   . Lipitor (Atorvastatin Calcium)   . Pravachol     History   Social History  . Marital Status: Married    Spouse Name: N/A    Number of Children: N/A  . Years of Education: N/A   Occupational History  . Not on file.   Social History Main Topics  . Smoking status: Former Smoker    Types: Cigarettes    Quit date: 03/06/1961  . Smokeless tobacco: Not on file  . Alcohol Use: No  . Drug Use: No  . Sexually Active: Not on file   Other Topics Concern  . Not on file   Social History Narrative  . No narrative on file    Family History  Problem Relation Age of Onset  . Heart disease Father   . Emphysema Father   . Stroke Sister     cerebral hemorrhage  . Breast  cancer Sister   . Heart disease Sister   . Heart failure Father   . Coronary artery disease Father   . Heart failure Sister     Physical Exam: Filed Vitals:   11/13/12 0902  BP: 121/70  Pulse: 57  Height: 5\' 6"  (1.676 m)  Weight: 159 lb (72.122 kg)    GEN- The patient is well appearing, alert and oriented x 3 today.   Head- normocephalic, atraumatic Eyes-  Sclera clear, conjunctiva pink Ears- hearing intact Oropharynx- clear Neck- supple, no JVP Lymph- no cervical lymphadenopathy Lungs- Clear to ausculation bilaterally, normal work of breathing Chest- pacemaker pocket is well healed Heart- irregular rate and rhythm, 2/6 SEM LUSB (early peaking), 2/6 SEM at the apex GI- soft, NT, ND, + BS Extremities- no clubbing, cyanosis, or edema  Pacemaker interrogation- reviewed in detail today,  See PACEART report  Assessment and Plan:

## 2012-12-03 ENCOUNTER — Encounter: Payer: Self-pay | Admitting: Internal Medicine

## 2012-12-25 ENCOUNTER — Ambulatory Visit (INDEPENDENT_AMBULATORY_CARE_PROVIDER_SITE_OTHER): Payer: Medicare Other | Admitting: *Deleted

## 2012-12-25 DIAGNOSIS — Z7901 Long term (current) use of anticoagulants: Secondary | ICD-10-CM

## 2012-12-25 DIAGNOSIS — I4891 Unspecified atrial fibrillation: Secondary | ICD-10-CM

## 2012-12-25 LAB — POCT INR: INR: 3.2

## 2013-01-03 ENCOUNTER — Other Ambulatory Visit: Payer: Self-pay | Admitting: Dermatology

## 2013-01-15 ENCOUNTER — Ambulatory Visit (INDEPENDENT_AMBULATORY_CARE_PROVIDER_SITE_OTHER): Payer: Medicare Other | Admitting: *Deleted

## 2013-01-15 DIAGNOSIS — Z7901 Long term (current) use of anticoagulants: Secondary | ICD-10-CM

## 2013-01-15 DIAGNOSIS — I4891 Unspecified atrial fibrillation: Secondary | ICD-10-CM

## 2013-01-15 LAB — POCT INR: INR: 3.1

## 2013-02-07 ENCOUNTER — Ambulatory Visit (INDEPENDENT_AMBULATORY_CARE_PROVIDER_SITE_OTHER): Payer: Medicare Other | Admitting: Cardiology

## 2013-02-07 ENCOUNTER — Encounter: Payer: Self-pay | Admitting: Cardiology

## 2013-02-07 ENCOUNTER — Ambulatory Visit (INDEPENDENT_AMBULATORY_CARE_PROVIDER_SITE_OTHER): Payer: Medicare Other | Admitting: Pharmacist

## 2013-02-07 VITALS — BP 114/52 | HR 52 | Ht 65.0 in | Wt 152.8 lb

## 2013-02-07 DIAGNOSIS — I4891 Unspecified atrial fibrillation: Secondary | ICD-10-CM

## 2013-02-07 DIAGNOSIS — I359 Nonrheumatic aortic valve disorder, unspecified: Secondary | ICD-10-CM

## 2013-02-07 DIAGNOSIS — Z7901 Long term (current) use of anticoagulants: Secondary | ICD-10-CM

## 2013-02-07 DIAGNOSIS — I10 Essential (primary) hypertension: Secondary | ICD-10-CM

## 2013-02-07 DIAGNOSIS — I350 Nonrheumatic aortic (valve) stenosis: Secondary | ICD-10-CM

## 2013-02-07 DIAGNOSIS — E785 Hyperlipidemia, unspecified: Secondary | ICD-10-CM

## 2013-02-07 LAB — POCT INR: INR: 2.7

## 2013-02-07 NOTE — Assessment & Plan Note (Signed)
The patient is on both Niaspan and simvastatin.  The Niaspan is causing her to have bothersome flushing of her body at night.  We will stop Niaspan and continue simvastatin.

## 2013-02-07 NOTE — Assessment & Plan Note (Signed)
She denies any hypoglycemic episodes. 

## 2013-02-07 NOTE — Assessment & Plan Note (Signed)
Blood pressure has been remaining stable on current therapy. 

## 2013-02-07 NOTE — Progress Notes (Signed)
[name] Date of Birth:  June 19, 1927 [facility] [address] [place], [place]  [address] [phone]         Fax   [phone]  History of Present Illness: This pleasant 79 year old woman is seen for a scheduled followup office visit. She has a past history of established atrial fibrillation. She has a history of aortic stenosis which on echocardiogram on 12/29/10 was stable and not severe. She does not have any history of ischemic heart disease. She had a normal nuclear stress test 01/21/07. She has a history of tachybradycardia syndrome and has a functioning pacemaker. Since last visit she has been doing well. No new cardiac symptoms. She has a history of diabetes mellitus and is followed by Dr. Su Hilt. She has diabetic neuropathy and goes to [facility] once a year to see Dr. Conrad [place].  Recently she has been experiencing some intermittent sudden flushing episodes which occur always at night but not every night.  She is suspicious that this may be from her Niaspan.   Current Outpatient Prescriptions  Medication Sig Dispense Refill  . amoxicillin (AMOXIL) 500 MG capsule as directed.      . calcium carbonate (OS-CAL) 600 MG TABS Take 1,200 mg by mouth daily.        . digoxin (LANOXIN) 0.25 MG tablet TAKE ONE-HALF TABLET BY MOUTH EVERY DAY OR  AS  DIRECTED  120 tablet  2  . diltiazem (CARDIZEM CD) 240 MG 24 hr capsule TAKE ONE CAPSULE BY MOUTH EVERY DAY  90 capsule  3  . furosemide (LASIX) 40 MG tablet Take one tablet alternating with 2 tablets  135 tablet  11  . gabapentin (NEURONTIN) 300 MG capsule Daily. 1 in the am and 3 in the pm      . levothyroxine (SYNTHROID, LEVOTHROID) 50 MCG tablet Take 50 mcg by mouth daily.        Marland Kitchen lisinopril (PRINIVIL,ZESTRIL) 2.5 MG tablet Take 2.5 mg by mouth daily.      . metFORMIN (GLUCOPHAGE) 1000 MG tablet TAKE ONE TABLET BY MOUTH TWICE DAILY WITH MEALS  180 tablet  0  . metoprolol (LOPRESSOR) 100 MG tablet TAKE ONE  TABLET BY MOUTH TWICE DAILY  180 tablet  3  . Multiple Vitamins-Minerals (OCUVITE PO) Take 1 tablet by mouth 2 (two) times daily.        . simvastatin (ZOCOR) 20 MG tablet TAKE ONE TABLET BY MOUTH AT BEDTIME  90 tablet  3  . warfarin (COUMADIN) 5 MG tablet Take as directed by coumadin clinic  40 tablet  1   No current facility-administered medications for this visit.    Allergies  Allergen Reactions  . Aspirin Other (See Comments)    Gi bleeding  . Codeine   . Lipitor (Atorvastatin Calcium)   . Pravachol     Patient Active Problem List   Diagnosis Date Noted  . Aortic stenosis 03/08/2011    Priority: High  . ATRIAL FIBRILLATION, CHRONIC 10/28/2010    Priority: High  . Essential hypertension 03/08/2011  . Diabetes mellitus 03/08/2011  . HYPERLIPIDEMIA 10/28/2010  . BRADYCARDIA-TACHYCARDIA SYNDROME 10/28/2010    History  Smoking status  . Former Smoker  . Types: Cigarettes  . Quit date: 03/06/1961  Smokeless tobacco  . Not on file    History  Alcohol Use No    Family History  Problem Relation Age of Onset  . Heart disease Father   . Emphysema Father   . Stroke Sister  cerebral hemorrhage  . Breast cancer Sister   . Heart disease Sister   . Heart failure Father   . Coronary artery disease Father   . Heart failure Sister     Review of Systems: Constitutional: no fever chills diaphoresis or fatigue or change in weight.  Head and neck: no hearing loss, no epistaxis, no photophobia or visual disturbance. Respiratory: No cough, shortness of breath or wheezing. Cardiovascular: No chest pain peripheral edema, palpitations. Gastrointestinal: No abdominal distention, no abdominal pain, no change in bowel habits hematochezia or melena. Genitourinary: No dysuria, no frequency, no urgency, no nocturia. Musculoskeletal:No arthralgias, no back pain, no gait disturbance or myalgias. Neurological: No dizziness, no headaches, no numbness, no seizures, no syncope, no  weakness, no tremors. Hematologic: No lymphadenopathy, no easy bruising. Psychiatric: No confusion, no hallucinations, no sleep disturbance.    Physical Exam: Filed Vitals:   02/07/13 1051  BP: 114/52  Pulse: 52   The general appearance reveals a well-developed well-nourished woman in no distress.The head and neck exam reveals pupils equal and reactive.  Extraocular movements are full.  There is no scleral icterus.  The mouth and pharynx are normal.  The neck is supple.  The carotids reveal no bruits.  The jugular venous pressure is normal.  The  thyroid is not enlarged.  There is no lymphadenopathy.  The chest is clear to percussion and auscultation.  There are no rales or rhonchi.  Expansion of the chest is symmetrical.  The pulse is irregular The precordium is quiet.  The first heart sound is normal.  The second heart sound is physiologically split.  There is no gallop rub or click.  There is a soft basilar systolic murmur present.  There is no abnormal lift or heave.  The abdomen is soft and nontender.  The bowel sounds are normal.  The liver and spleen are not enlarged.  There are no abdominal masses.  There are no abdominal bruits.  Extremities reveal good pedal pulses.  There is no phlebitis or edema.  There is no cyanosis or clubbing.  Strength is normal and symmetrical in all extremities.  There is no lateralizing weakness.  There are no sensory deficits.  The skin is warm and dry.  There is no rash.    Assessment / Plan:  Continue same medication except stop Niaspan because of side effects.  She cannot afford the cost of ezetimibe.  Continue simvastatin alone at this point.  Recheck in 4 months for office visit and EKG.

## 2013-02-07 NOTE — Assessment & Plan Note (Signed)
She is not having any symptoms from her mild aortic stenosis

## 2013-02-07 NOTE — Assessment & Plan Note (Signed)
The patient has not had any TIA symptoms.  She is on chronic Coumadin for her atrial fibrillation.  He has a functioning single-chamber ventricular pacemaker.

## 2013-02-07 NOTE — Patient Instructions (Signed)
STOP NIASPAN  Your physician recommends that you schedule a follow-up appointment in: 4 month ov/ekg

## 2013-03-07 ENCOUNTER — Ambulatory Visit (INDEPENDENT_AMBULATORY_CARE_PROVIDER_SITE_OTHER): Payer: Medicare Other | Admitting: *Deleted

## 2013-03-07 DIAGNOSIS — Z7901 Long term (current) use of anticoagulants: Secondary | ICD-10-CM

## 2013-03-07 DIAGNOSIS — I4891 Unspecified atrial fibrillation: Secondary | ICD-10-CM

## 2013-03-07 LAB — POCT INR: INR: 2.7

## 2013-04-07 ENCOUNTER — Ambulatory Visit (INDEPENDENT_AMBULATORY_CARE_PROVIDER_SITE_OTHER): Payer: Medicare Other | Admitting: Pharmacist

## 2013-04-07 DIAGNOSIS — Z7901 Long term (current) use of anticoagulants: Secondary | ICD-10-CM

## 2013-04-07 DIAGNOSIS — I4891 Unspecified atrial fibrillation: Secondary | ICD-10-CM

## 2013-04-07 LAB — POCT INR: INR: 2.7

## 2013-04-29 ENCOUNTER — Other Ambulatory Visit: Payer: Self-pay | Admitting: Cardiology

## 2013-05-08 ENCOUNTER — Other Ambulatory Visit: Payer: Self-pay | Admitting: Cardiology

## 2013-05-12 ENCOUNTER — Ambulatory Visit (INDEPENDENT_AMBULATORY_CARE_PROVIDER_SITE_OTHER): Payer: Medicare Other | Admitting: *Deleted

## 2013-05-12 DIAGNOSIS — I4891 Unspecified atrial fibrillation: Secondary | ICD-10-CM

## 2013-05-12 DIAGNOSIS — Z7901 Long term (current) use of anticoagulants: Secondary | ICD-10-CM

## 2013-05-12 LAB — PACEMAKER DEVICE OBSERVATION
BATTERY VOLTAGE: 2.8 V
BRDY-0002RV: 65 {beats}/min
DEVICE MODEL PM: 2036664
RV LEAD IMPEDENCE PM: 532 Ohm

## 2013-05-12 NOTE — Progress Notes (Signed)
PPM check in office. 

## 2013-06-06 ENCOUNTER — Other Ambulatory Visit: Payer: Self-pay | Admitting: Dermatology

## 2013-06-09 ENCOUNTER — Encounter: Payer: Self-pay | Admitting: *Deleted

## 2013-06-09 ENCOUNTER — Other Ambulatory Visit: Payer: Self-pay | Admitting: Cardiology

## 2013-06-11 ENCOUNTER — Other Ambulatory Visit: Payer: Medicare Other

## 2013-06-11 ENCOUNTER — Encounter: Payer: Self-pay | Admitting: Cardiology

## 2013-06-11 ENCOUNTER — Ambulatory Visit (INDEPENDENT_AMBULATORY_CARE_PROVIDER_SITE_OTHER): Payer: Medicare Other | Admitting: Cardiology

## 2013-06-11 ENCOUNTER — Ambulatory Visit (INDEPENDENT_AMBULATORY_CARE_PROVIDER_SITE_OTHER): Payer: Medicare Other | Admitting: Pharmacist

## 2013-06-11 VITALS — BP 118/64 | HR 65 | Ht 65.0 in | Wt 153.0 lb

## 2013-06-11 DIAGNOSIS — I359 Nonrheumatic aortic valve disorder, unspecified: Secondary | ICD-10-CM

## 2013-06-11 DIAGNOSIS — R0989 Other specified symptoms and signs involving the circulatory and respiratory systems: Secondary | ICD-10-CM

## 2013-06-11 DIAGNOSIS — Z7901 Long term (current) use of anticoagulants: Secondary | ICD-10-CM

## 2013-06-11 DIAGNOSIS — I4891 Unspecified atrial fibrillation: Secondary | ICD-10-CM

## 2013-06-11 DIAGNOSIS — I10 Essential (primary) hypertension: Secondary | ICD-10-CM

## 2013-06-11 DIAGNOSIS — E785 Hyperlipidemia, unspecified: Secondary | ICD-10-CM

## 2013-06-11 DIAGNOSIS — I350 Nonrheumatic aortic (valve) stenosis: Secondary | ICD-10-CM

## 2013-06-11 LAB — POCT INR: INR: 2.5

## 2013-06-11 NOTE — Patient Instructions (Signed)
Your physician has requested that you have a carotid duplex. This test is an ultrasound of the carotid arteries in your neck. It looks at blood flow through these arteries that supply the brain with blood. Allow one hour for this exam. There are no restrictions or special instructions.  Your physician recommends that you continue on your current medications as directed. Please refer to the Current Medication list given to you today  Your physician wants you to follow-up in: 4 MONTHS You will receive a reminder letter in the mail two months in advance. If you don't receive a letter, please call our office to schedule the follow-up appointment.

## 2013-06-11 NOTE — Assessment & Plan Note (Signed)
On exam today she is noted to have a left carotid bruit.  She denies any symptoms to suggest TIA or stroke or amaurosis fugax.  We will get a carotid artery duplex to evaluate further

## 2013-06-11 NOTE — Assessment & Plan Note (Signed)
The patient has a history of hyperlipidemia followed by her internist Dr. Su Hilt.  Her flushing has resolved after Niaspan was stopped.

## 2013-06-11 NOTE — Assessment & Plan Note (Signed)
The patient has been feeling well.  No headaches or dizzy spells.  No symptoms of CHF.

## 2013-06-11 NOTE — Assessment & Plan Note (Signed)
The patient has chronic underlying atrial fibrillation and is o her INRs have been good.  She is not having any recurrent GI bleeding.n long-term Coumadin.

## 2013-06-11 NOTE — Progress Notes (Signed)
[name] Date of Birth:  1926-11-24 [facility] [address] [place], [place]  [address] [phone]         Fax   [phone]  History of Present Illness: This pleasant 79 year old woman is seen for a scheduled followup office visit. She has a past history of established atrial fibrillation. She has a history of aortic stenosis which on echocardiogram on 12/29/10 was stable and not severe. She does not have any history of ischemic heart disease. She had a normal nuclear stress test 01/21/07. She has a history of tachybradycardia syndrome and has a functioning pacemaker. Since last visit she has been doing well. No new cardiac symptoms. She has a history of diabetes mellitus and is followed by Dr. Su Hilt. She has diabetic neuropathy and goes to [facility] once a year to see Dr. Conrad [place].  She previously had had some flushing from Niaspan which resolved after Niaspan was stopped.    Current Outpatient Prescriptions  Medication Sig Dispense Refill  . amoxicillin (AMOXIL) 500 MG capsule as directed.      . calcium carbonate (OS-CAL) 600 MG TABS Take 1,200 mg by mouth daily.        . digoxin (LANOXIN) 0.25 MG tablet TAKE ONE-HALF TABLET BY MOUTH EVERY DAY OR  AS  DIRECTED  120 tablet  2  . diltiazem (CARDIZEM CD) 240 MG 24 hr capsule TAKE ONE CAPSULE BY MOUTH EVERY DAY  90 capsule  3  . furosemide (LASIX) 40 MG tablet Take one tablet alternating with 2 tablets  135 tablet  11  . gabapentin (NEURONTIN) 300 MG capsule Daily. 1 in the am and 3 in the pm      . levothyroxine (SYNTHROID, LEVOTHROID) 50 MCG tablet Take 50 mcg by mouth daily.        Marland Kitchen lisinopril (PRINIVIL,ZESTRIL) 2.5 MG tablet Take 2.5 mg by mouth daily.      . metFORMIN (GLUCOPHAGE) 1000 MG tablet TAKE ONE TABLET BY MOUTH TWICE DAILY WITH MEALS  180 tablet  0  . metoprolol (LOPRESSOR) 100 MG tablet TAKE ONE TABLET BY MOUTH TWICE DAILY  180 tablet  0  . Multiple Vitamins-Minerals (OCUVITE PO) Take  1 tablet by mouth 2 (two) times daily.        . simvastatin (ZOCOR) 20 MG tablet TAKE ONE TABLET BY MOUTH EVERY DAY AT BEDTIME  90 tablet  2  . warfarin (COUMADIN) 5 MG tablet TAKE AS DIRECTED BY  COUMADIN  CLINIC  40 tablet  1   No current facility-administered medications for this visit.    Allergies  Allergen Reactions  . Aspirin Other (See Comments)    Gi bleeding  . Codeine   . Lipitor [Atorvastatin Calcium]   . Pravachol     Patient Active Problem List   Diagnosis Date Noted  . Aortic stenosis 03/08/2011    Priority: High  . ATRIAL FIBRILLATION, CHRONIC 10/28/2010    Priority: High  . Left carotid bruit 06/11/2013  . Essential hypertension 03/08/2011  . Diabetes mellitus 03/08/2011  . HYPERLIPIDEMIA 10/28/2010  . BRADYCARDIA-TACHYCARDIA SYNDROME 10/28/2010    History  Smoking status  . Former Smoker  . Types: Cigarettes  . Quit date: 03/06/1961  Smokeless tobacco  . Not on file    History  Alcohol Use No    Family History  Problem Relation Age of Onset  . Heart disease Father   . Emphysema Father   . Stroke Sister     cerebral hemorrhage  .  Breast cancer Sister   . Heart disease Sister   . Heart failure Father   . Coronary artery disease Father   . Heart failure Sister     Review of Systems: Constitutional: no fever chills diaphoresis or fatigue or change in weight.  Head and neck: no hearing loss, no epistaxis, no photophobia or visual disturbance. Respiratory: No cough, shortness of breath or wheezing. Cardiovascular: No chest pain peripheral edema, palpitations. Gastrointestinal: No abdominal distention, no abdominal pain, no change in bowel habits hematochezia or melena. Genitourinary: No dysuria, no frequency, no urgency, no nocturia. Musculoskeletal:No arthralgias, no back pain, no gait disturbance or myalgias. Neurological: No dizziness, no headaches, no numbness, no seizures, no syncope, no weakness, no tremors. Hematologic: No  lymphadenopathy, no easy bruising. Psychiatric: No confusion, no hallucinations, no sleep disturbance.    Physical Exam: Filed Vitals:   06/11/13 1055  BP: 118/64  Pulse: 65   the general appearance reveals a well-developed well-nourished woman in no distress.  Her color is good.  She does not appear anemic.The head and neck exam reveals pupils equal and reactive.  Extraocular movements are full.  There is no scleral icterus.  The mouth and pharynx are normal.  The neck is supple.  The carotids reveal no bruits.  The jugular venous pressure is normal.  The  thyroid is not enlarged.  There is no lymphadenopathy.  The chest is clear to percussion and auscultation.  There are no rales or rhonchi.  Expansion of the chest is symmetrical.  The precordium is quiet.  The pulse is regular and paced.  The first heart sound is normal.  The second heart sound is physiologically split.  There is no murmur gallop rub or click.  There is no abnormal lift or heave.  The abdomen is soft and nontender.  The bowel sounds are normal.  The liver and spleen are not enlarged.  There are no abdominal masses.  There are no abdominal bruits.  Extremities reveal good pedal pulses.  There is no phlebitis or edema.  There is no cyanosis or clubbing.  Strength is normal and symmetrical in all extremities.  There is no lateralizing weakness.  There are no sensory deficits.  The skin is warm and dry.  There is no rash.   EKG shows underlying atrial fibrillation and she has ventricular pacing at 65 per minute.  Assessment / Plan: Continue on same medication.  Arrange for carotid duplex. Recheck in 4 months for followup office visit

## 2013-06-11 NOTE — Assessment & Plan Note (Signed)
The patient denies any hypoglycemic episodes 

## 2013-06-12 ENCOUNTER — Encounter (INDEPENDENT_AMBULATORY_CARE_PROVIDER_SITE_OTHER): Payer: Medicare Other

## 2013-06-12 DIAGNOSIS — I6529 Occlusion and stenosis of unspecified carotid artery: Secondary | ICD-10-CM

## 2013-06-12 DIAGNOSIS — R0989 Other specified symptoms and signs involving the circulatory and respiratory systems: Secondary | ICD-10-CM

## 2013-06-16 ENCOUNTER — Telehealth: Payer: Self-pay | Admitting: *Deleted

## 2013-06-16 NOTE — Telephone Encounter (Signed)
Advised patient

## 2013-06-16 NOTE — Telephone Encounter (Signed)
Message copied by Burnell Blanks on Mon Jun 16, 2013  6:11 PM ------      Message from: Cassell Clement      Created: Fri Jun 13, 2013  5:11 PM       Please report.  The carotid duplex shows only mild plaque.  No significant blockage.  The stenosis is less than 40% bilaterally.  Continue current medication. ------

## 2013-06-17 ENCOUNTER — Other Ambulatory Visit: Payer: Self-pay | Admitting: Cardiology

## 2013-06-30 ENCOUNTER — Other Ambulatory Visit: Payer: Self-pay

## 2013-06-30 DIAGNOSIS — Z1231 Encounter for screening mammogram for malignant neoplasm of breast: Secondary | ICD-10-CM

## 2013-07-04 ENCOUNTER — Ambulatory Visit (INDEPENDENT_AMBULATORY_CARE_PROVIDER_SITE_OTHER): Payer: Medicare Other | Admitting: *Deleted

## 2013-07-04 DIAGNOSIS — Z7901 Long term (current) use of anticoagulants: Secondary | ICD-10-CM

## 2013-07-04 DIAGNOSIS — I4891 Unspecified atrial fibrillation: Secondary | ICD-10-CM

## 2013-07-30 ENCOUNTER — Ambulatory Visit
Admission: RE | Admit: 2013-07-30 | Discharge: 2013-07-30 | Disposition: A | Discharge status: Home / Self Care | Payer: Medicare Other | Source: Ambulatory Visit

## 2013-07-30 DIAGNOSIS — Z1231 Encounter for screening mammogram for malignant neoplasm of breast: Secondary | ICD-10-CM

## 2013-08-01 ENCOUNTER — Ambulatory Visit (INDEPENDENT_AMBULATORY_CARE_PROVIDER_SITE_OTHER): Payer: Medicare Other | Admitting: General Practice

## 2013-08-01 DIAGNOSIS — I4891 Unspecified atrial fibrillation: Secondary | ICD-10-CM

## 2013-08-01 DIAGNOSIS — Z7901 Long term (current) use of anticoagulants: Secondary | ICD-10-CM

## 2013-08-12 ENCOUNTER — Other Ambulatory Visit: Payer: Self-pay

## 2013-08-12 MED ORDER — METOPROLOL TARTRATE 100 MG PO TABS: ORAL_TABLET | ORAL | Status: DC

## 2013-09-01 ENCOUNTER — Ambulatory Visit (INDEPENDENT_AMBULATORY_CARE_PROVIDER_SITE_OTHER): Payer: Medicare Other | Admitting: Pharmacist

## 2013-09-01 DIAGNOSIS — I4891 Unspecified atrial fibrillation: Secondary | ICD-10-CM

## 2013-09-01 DIAGNOSIS — Z7901 Long term (current) use of anticoagulants: Secondary | ICD-10-CM

## 2013-09-01 LAB — POCT INR: INR: 2.4

## 2013-09-16 ENCOUNTER — Other Ambulatory Visit: Payer: Self-pay | Admitting: Cardiology

## 2013-09-29 ENCOUNTER — Ambulatory Visit (INDEPENDENT_AMBULATORY_CARE_PROVIDER_SITE_OTHER): Payer: Medicare Other | Admitting: Pharmacist

## 2013-09-29 DIAGNOSIS — I4891 Unspecified atrial fibrillation: Secondary | ICD-10-CM

## 2013-09-29 DIAGNOSIS — Z7901 Long term (current) use of anticoagulants: Secondary | ICD-10-CM

## 2013-09-29 LAB — POCT INR: INR: 2.3

## 2013-10-08 ENCOUNTER — Telehealth: Payer: Self-pay | Admitting: *Deleted

## 2013-10-08 NOTE — Telephone Encounter (Signed)
Received a letter from [facility] regarding patients Diltazem and Simvastatin. Recommendations now are reducing Simvastatin to 10 mg daily. Reviewed by  Dr. Patty SermonsBrackbill and he agrees. Advised patient, verbalized understanding.

## 2013-10-20 ENCOUNTER — Ambulatory Visit (INDEPENDENT_AMBULATORY_CARE_PROVIDER_SITE_OTHER): Payer: Medicare Other | Admitting: *Deleted

## 2013-10-20 DIAGNOSIS — I4891 Unspecified atrial fibrillation: Secondary | ICD-10-CM

## 2013-10-20 DIAGNOSIS — Z7901 Long term (current) use of anticoagulants: Secondary | ICD-10-CM

## 2013-10-20 LAB — POCT INR: INR: 1.8

## 2013-10-28 ENCOUNTER — Other Ambulatory Visit: Payer: Self-pay | Admitting: *Deleted

## 2013-10-28 MED ORDER — WARFARIN SODIUM 5 MG PO TABS: ORAL_TABLET | ORAL | Status: DC

## 2013-11-03 ENCOUNTER — Ambulatory Visit (INDEPENDENT_AMBULATORY_CARE_PROVIDER_SITE_OTHER): Payer: Medicare Other

## 2013-11-03 DIAGNOSIS — I4891 Unspecified atrial fibrillation: Secondary | ICD-10-CM

## 2013-11-03 DIAGNOSIS — Z7901 Long term (current) use of anticoagulants: Secondary | ICD-10-CM

## 2013-11-03 LAB — POCT INR: INR: 2.2

## 2013-11-13 ENCOUNTER — Ambulatory Visit (INDEPENDENT_AMBULATORY_CARE_PROVIDER_SITE_OTHER): Payer: Medicare Other | Admitting: Internal Medicine

## 2013-11-13 ENCOUNTER — Encounter: Payer: Self-pay | Admitting: Internal Medicine

## 2013-11-13 VITALS — BP 102/58 | HR 72 | Ht 65.0 in | Wt 155.2 lb

## 2013-11-13 DIAGNOSIS — I4891 Unspecified atrial fibrillation: Secondary | ICD-10-CM

## 2013-11-13 DIAGNOSIS — I495 Sick sinus syndrome: Secondary | ICD-10-CM

## 2013-11-13 DIAGNOSIS — I10 Essential (primary) hypertension: Secondary | ICD-10-CM

## 2013-11-13 LAB — MDC_IDC_ENUM_SESS_TYPE_INCLINIC
Battery Impedance: 1000 Ohm — CL
Brady Statistic RV Percent Paced: 33 %
Implantable Pulse Generator Serial Number: 2036664
Lead Channel Impedance Value: 540 Ohm
Lead Channel Setting Pacing Amplitude: 2.5 V
Lead Channel Setting Sensing Sensitivity: 2 mV
MDC IDC MSMT BATTERY VOLTAGE: 2.8 V
MDC IDC MSMT LEADCHNL RV PACING THRESHOLD AMPLITUDE: 0.75 V
MDC IDC MSMT LEADCHNL RV PACING THRESHOLD PULSEWIDTH: 0.4 ms
MDC IDC MSMT LEADCHNL RV SENSING INTR AMPL: 12 mV
MDC IDC SESS DTM: 20150212120421
MDC IDC SET LEADCHNL RV PACING PULSEWIDTH: 0.4 ms

## 2013-11-13 NOTE — Patient Instructions (Addendum)
Your next pacemaker check will be on 05-13-2014 @ 11:00am with the device clinic.  Your physician wants you to follow-up in: 12 months with Sharrell KuBrooke Edmisten,PA You will receive a reminder letter in the mail two months in advance. If you don't receive a letter, please call our office to schedule the follow-up appointment.

## 2013-11-13 NOTE — Progress Notes (Signed)
PCP:  Lorenda Peck, MD Primary Cardiologist:  Dr Patty Sermons   The patient presents today for routine electrophysiology followup.  Since last being seen in our clinic, the patient reports doing very well.  She remains active despite her age.  Today, she denies symptoms of palpitations, chest pain, shortness of breath, orthopnea, PND, lower extremity edema, dizziness, presyncope, syncope, or neurologic sequela.  Her primary concern remains with neuropathic pain, worse at night. The patient feels that she is tolerating medications without difficulties and is otherwise without complaint today.   Past Medical History  Diagnosis Date  . Hypertension     essential  . Diabetes mellitus   . CHF (congestive heart failure)     compensated  . Hyperlipidemia   . Permanent atrial fibrillation     on coumadin  . Tachycardia-bradycardia syndrome     with single chamber ventricular pacemaker  . Hypercholesteremia     with intolerance to statin therapy  . Aortic stenosis   . History of peptic ulcer disease   . Peripheral neuropathy   . Thrombocytopenia   . Anemia associated with acute blood loss 03/11/2007   . Gastrointestinal bleed 03/11/2007   . Duodenal ulcer 03/11/2007   . Thyroid disease    Past Surgical History  Procedure Laterality Date  . Shoulder surgery  12/07/2004  . Pacemaker insertion  06/18/2008    SJM Zephyr XL SR implanted by Dr Reyes Ivan  . Breast biopsy  02/22/2007     left  . Cardioversion  05/16/2005    Electrical cardioversion   . Esophagogastroduodenoscopy  03/12/2007     Esophagogastroduodenoscopy with control of bleeding    Current Outpatient Prescriptions  Medication Sig Dispense Refill  . amoxicillin (AMOXIL) 500 MG capsule For dental procedures only      . calcium carbonate (OS-CAL) 600 MG TABS Take 1,200 mg by mouth daily.        . digoxin (LANOXIN) 0.25 MG tablet TAKE ONE-HALF TABLET BY MOUTH EVERY DAY OR  AS  DIRECTED  120 tablet  0  . diltiazem (CARDIZEM  CD) 240 MG 24 hr capsule TAKE ONE CAPSULE BY MOUTH EVERY DAY  90 capsule  1  . furosemide (LASIX) 40 MG tablet Take one tablet alternating with 2 tablets  135 tablet  11  . gabapentin (NEURONTIN) 300 MG capsule Daily. 1 in the am and 3 in the pm      . levothyroxine (SYNTHROID, LEVOTHROID) 50 MCG tablet Take 50 mcg by mouth daily.        Marland Kitchen lisinopril (PRINIVIL,ZESTRIL) 2.5 MG tablet Take 2.5 mg by mouth daily.      . metFORMIN (GLUCOPHAGE) 1000 MG tablet TAKE ONE TABLET BY MOUTH TWICE DAILY WITH MEALS  180 tablet  0  . metoprolol (LOPRESSOR) 100 MG tablet TAKE ONE TABLET BY MOUTH TWICE DAILY  180 tablet  1  . Multiple Vitamins-Minerals (OCUVITE PO) Take 1 tablet by mouth 2 (two) times daily.        . simvastatin (ZOCOR) 20 MG tablet 1/2 tablet daily      . warfarin (COUMADIN) 5 MG tablet TAKE AS DIRECTED BY  COUMADIN  CLINIC  40 tablet  1   No current facility-administered medications for this visit.    Allergies  Allergen Reactions  . Aspirin Other (See Comments)    Gi bleeding  . Codeine   . Lipitor [Atorvastatin Calcium]   . Pravachol     History   Social History  . Marital Status: Married  Spouse Name: N/A    Number of Children: N/A  . Years of Education: N/A   Occupational History  . Not on file.   Social History Main Topics  . Smoking status: Former Smoker    Types: Cigarettes    Quit date: 03/06/1961  . Smokeless tobacco: Not on file  . Alcohol Use: No  . Drug Use: No  . Sexual Activity: Not on file   Other Topics Concern  . Not on file   Social History Narrative  . No narrative on file    Family History  Problem Relation Age of Onset  . Heart disease Father   . Emphysema Father   . Stroke Sister     cerebral hemorrhage  . Breast cancer Sister   . Heart disease Sister   . Heart failure Father   . Coronary artery disease Father   . Heart failure Sister     Physical Exam: Filed Vitals:   11/13/13 1132  BP: 102/58  Pulse: 72  Height: 5\' 5"   (1.651 m)  Weight: 155 lb 3.2 oz (70.398 kg)    GEN- The patient is well appearing, alert and oriented x 3 today.   Head- normocephalic, atraumatic Eyes-  Sclera clear, conjunctiva pink Ears- hearing intact Oropharynx- clear Neck- supple, no JVP Lymph- no cervical lymphadenopathy Lungs- Clear to ausculation bilaterally, normal work of breathing Chest- pacemaker pocket is well healed Heart- irregular rate and rhythm, 2/6 SEM LUSB (early peaking), 2/6 SEM at the apex GI- soft, NT, ND, + BS Extremities- no clubbing, cyanosis, or edema  Pacemaker interrogation- reviewed in detail today,  See PACEART report  Assessment and Plan:  1. Permanent afib chads2vasc score is at least 5.  She should continue coumadin long term Echo is reviewed form 2013 Dr Jenness CornerBrackbills notes are reviewed  2. Bradycardia Normal pacemaker function See Pace Art report No changes today  3. HTN Stable No change required today  Return to the device clinic in 6 months Return to see Nehemiah SettleBrooke in 1 year Dr Patty SermonsBrackbill to see as scheduled

## 2013-11-21 ENCOUNTER — Encounter: Payer: Self-pay | Admitting: Internal Medicine

## 2013-11-24 ENCOUNTER — Ambulatory Visit (INDEPENDENT_AMBULATORY_CARE_PROVIDER_SITE_OTHER): Payer: Medicare Other | Admitting: *Deleted

## 2013-11-24 ENCOUNTER — Ambulatory Visit (INDEPENDENT_AMBULATORY_CARE_PROVIDER_SITE_OTHER): Payer: Medicare Other | Admitting: Cardiology

## 2013-11-24 ENCOUNTER — Encounter: Payer: Self-pay | Admitting: Cardiology

## 2013-11-24 VITALS — BP 114/56 | HR 60 | Ht 65.0 in | Wt 154.0 lb

## 2013-11-24 DIAGNOSIS — I4891 Unspecified atrial fibrillation: Secondary | ICD-10-CM

## 2013-11-24 DIAGNOSIS — M1990 Unspecified osteoarthritis, unspecified site: Secondary | ICD-10-CM | POA: Insufficient documentation

## 2013-11-24 DIAGNOSIS — Z5181 Encounter for therapeutic drug level monitoring: Secondary | ICD-10-CM | POA: Insufficient documentation

## 2013-11-24 DIAGNOSIS — Z7901 Long term (current) use of anticoagulants: Secondary | ICD-10-CM

## 2013-11-24 DIAGNOSIS — I10 Essential (primary) hypertension: Secondary | ICD-10-CM

## 2013-11-24 DIAGNOSIS — I119 Hypertensive heart disease without heart failure: Secondary | ICD-10-CM

## 2013-11-24 DIAGNOSIS — R0989 Other specified symptoms and signs involving the circulatory and respiratory systems: Secondary | ICD-10-CM

## 2013-11-24 LAB — POCT INR: INR: 2.3

## 2013-11-24 NOTE — Patient Instructions (Signed)
Your physician recommends that you continue on your current medications as directed. Please refer to the Current Medication list given to you today.  Your physician recommends that you schedule a follow-up appointment in: 4 month ov  

## 2013-11-24 NOTE — Assessment & Plan Note (Signed)
The patient has not had any TIA or stroke symptoms. 

## 2013-11-24 NOTE — Assessment & Plan Note (Signed)
Blood pressure has been remaining stable.  She has not been experiencing any dizzy spells or syncope.  No headaches.  No symptoms of CHF.

## 2013-11-24 NOTE — Assessment & Plan Note (Signed)
The patient has chronic permanent atrial fibrillation.  She is on warfarin.  She has not been having any TIA or stroke symptoms.  No chest pain.  No bleeding problems from the Coumadin.

## 2013-11-24 NOTE — Assessment & Plan Note (Signed)
Patient has been experiencing pain in the left hip region.  She notices it when she tries to roll over in bed.  She does not have any pain when she walks which is fortunate for her.  She had a [facility] of the [facility] last [facility] which showed mild degenerative arthritis.

## 2013-11-24 NOTE — Progress Notes (Signed)
[name] Date of Birth:  Jan 29, 1927 [address] [place], [place]  [address] [phone]         Fax   [phone]  History of Present Illness: This pleasant 80 year old woman is seen for a scheduled followup office visit. She has a past history of established atrial fibrillation. She has a history of aortic stenosis which on echocardiogram on 12/29/10 was stable and not severe. She does not have any history of ischemic heart disease. She had a normal nuclear stress test 01/21/07. She has a history of tachybradycardia syndrome and has a functioning pacemaker. Since last visit she has been doing well. No new cardiac symptoms. She has a history of diabetes mellitus and is followed by Dr. Su Hilt. She has diabetic neuropathy and goes to [facility] once a year to see Dr. Conrad [employer].  Her lipids and blood work are followed by Dr. Su Hilt.  She has her annual physical examination coming up in March.  The patient had a recent checkup with Dr. Johney Frame regarding her pacemaker and everything was found to be good. The patient has a known soft left carotid bruit.  She had carotid duplex ultrasound on 06/13/13 which showed less than 40% stenosis bilaterally.  Current Outpatient Prescriptions  Medication Sig Dispense Refill  . amoxicillin (AMOXIL) 500 MG capsule For dental procedures only      . calcium carbonate (OS-CAL) 600 MG TABS Take 1,200 mg by mouth daily.        . digoxin (LANOXIN) 0.25 MG tablet TAKE ONE-HALF TABLET BY MOUTH EVERY DAY OR  AS  DIRECTED  120 tablet  0  . diltiazem (CARDIZEM CD) 240 MG 24 hr capsule TAKE ONE CAPSULE BY MOUTH EVERY DAY  90 capsule  1  . furosemide (LASIX) 40 MG tablet Take one tablet alternating with 2 tablets  135 tablet  11  . gabapentin (NEURONTIN) 300 MG capsule Daily. 1 in the am and 3 in the pm      . levothyroxine (SYNTHROID, LEVOTHROID) 50 MCG tablet Take 50 mcg by mouth daily.        Marland Kitchen lisinopril (PRINIVIL,ZESTRIL) 2.5 MG tablet Take  2.5 mg by mouth daily.      . metFORMIN (GLUCOPHAGE) 1000 MG tablet TAKE ONE TABLET BY MOUTH TWICE DAILY WITH MEALS  180 tablet  0  . metoprolol (LOPRESSOR) 100 MG tablet TAKE ONE TABLET BY MOUTH TWICE DAILY  180 tablet  1  . Multiple Vitamins-Minerals (VISION-VITE PRESERVE PO) Take 1 capsule by mouth 2 (two) times daily.      . simvastatin (ZOCOR) 20 MG tablet 1/2 tablet daily      . warfarin (COUMADIN) 5 MG tablet TAKE AS DIRECTED BY  COUMADIN  CLINIC  40 tablet  1   No current facility-administered medications for this visit.    Allergies  Allergen Reactions  . Aspirin Other (See Comments)    Gi bleeding  . Codeine   . Lipitor [Atorvastatin Calcium]   . Pravachol     Patient Active Problem List   Diagnosis Date Noted  . Aortic stenosis 03/08/2011    Priority: High  . ATRIAL FIBRILLATION, CHRONIC 10/28/2010    Priority: High  . Encounter for therapeutic drug monitoring 11/24/2013  . Left carotid bruit 06/11/2013  . Essential hypertension 03/08/2011  . Type II or unspecified type diabetes mellitus without mention of complication, uncontrolled 03/08/2011  . HYPERLIPIDEMIA 10/28/2010  . BRADYCARDIA-TACHYCARDIA SYNDROME 10/28/2010    History  Smoking status  . Former  Smoker  . Types: Cigarettes  . Quit date: 03/06/1961  Smokeless tobacco  . Not on file    History  Alcohol Use No    Family History  Problem Relation Age of Onset  . Heart disease Father   . Emphysema Father   . Stroke Sister     cerebral hemorrhage  . Breast cancer Sister   . Heart disease Sister   . Heart failure Father   . Coronary artery disease Father   . Heart failure Sister     Review of Systems: Constitutional: no fever chills diaphoresis or fatigue or change in weight.  Head and neck: no hearing loss, no epistaxis, no photophobia or visual disturbance. Respiratory: No cough, shortness of breath or wheezing. Cardiovascular: No chest pain peripheral edema,  palpitations. Gastrointestinal: No abdominal distention, no abdominal pain, no change in bowel habits hematochezia or melena. Genitourinary: No dysuria, no frequency, no urgency, no nocturia. Musculoskeletal:No arthralgias, no back pain, no gait disturbance or myalgias. Neurological: No dizziness, no headaches, no numbness, no seizures, no syncope, no weakness, no tremors. Hematologic: No lymphadenopathy, no easy bruising. Psychiatric: No confusion, no hallucinations, no sleep disturbance.    Physical Exam: Filed Vitals:   11/24/13 1130  BP: 114/56  Pulse: 60   the general appearance reveals a well-developed well-nourished woman in no distress.  Her color is good.  She does not appear anemic.The head and neck exam reveals pupils equal and reactive.  Extraocular movements are full.  There is no scleral icterus.  The mouth and pharynx are normal.  The neck is supple.  The carotids reveal soft left carotid bruits.  The jugular venous pressure is normal.  The  thyroid is not enlarged.  There is no lymphadenopathy.  The chest is clear to percussion and auscultation.  There are no rales or rhonchi.  Expansion of the chest is symmetrical.  The precordium is quiet.  The pulse is regular and paced.  The first heart sound is normal.  The second heart sound is physiologically split.  There is no gallop rub or click.  There is a grade 2/6 systolic ejection murmur of mild aortic stenosis at the base.  No diastolic murmur. There is no abnormal lift or heave.  The abdomen is soft and nontender.  The bowel sounds are normal.  The liver and spleen are not enlarged.  There are no abdominal masses.  There are no abdominal bruits.  Extremities reveal good pedal pulses.  There is no phlebitis or edema.  There is no cyanosis or clubbing.  Strength is normal and symmetrical in all extremities.  There is no lateralizing weakness.  There are no sensory deficits.  The skin is warm and dry.  There is no  rash.     Assessment / Plan: Overall the patient is doing very well.  Continue on same medication.  Recheck in 4 months for followup office visit

## 2013-12-16 ENCOUNTER — Other Ambulatory Visit: Payer: Self-pay

## 2013-12-16 MED ORDER — DILTIAZEM HCL ER COATED BEADS 240 MG PO CP24: ORAL_CAPSULE | ORAL | Status: DC

## 2013-12-22 ENCOUNTER — Ambulatory Visit (INDEPENDENT_AMBULATORY_CARE_PROVIDER_SITE_OTHER): Payer: Medicare Other | Admitting: Pharmacist

## 2013-12-22 DIAGNOSIS — Z7901 Long term (current) use of anticoagulants: Secondary | ICD-10-CM

## 2013-12-22 DIAGNOSIS — I4891 Unspecified atrial fibrillation: Secondary | ICD-10-CM

## 2013-12-22 DIAGNOSIS — Z5181 Encounter for therapeutic drug level monitoring: Secondary | ICD-10-CM

## 2013-12-22 LAB — POCT INR: INR: 2.1

## 2014-01-03 ENCOUNTER — Encounter (HOSPITAL_BASED_OUTPATIENT_CLINIC_OR_DEPARTMENT_OTHER): Payer: Self-pay | Admitting: Emergency Medicine

## 2014-01-03 ENCOUNTER — Emergency Department (HOSPITAL_BASED_OUTPATIENT_CLINIC_OR_DEPARTMENT_OTHER): Payer: Medicare Other

## 2014-01-03 ENCOUNTER — Emergency Department (HOSPITAL_BASED_OUTPATIENT_CLINIC_OR_DEPARTMENT_OTHER)
Admission: EM | Admit: 2014-01-03 | Discharge: 2014-01-03 | Disposition: A | Discharge status: Home / Self Care | Payer: Medicare Other | Attending: Emergency Medicine | Admitting: Emergency Medicine

## 2014-01-03 DIAGNOSIS — Z8669 Personal history of other diseases of the nervous system and sense organs: Secondary | ICD-10-CM | POA: Insufficient documentation

## 2014-01-03 DIAGNOSIS — Z7901 Long term (current) use of anticoagulants: Secondary | ICD-10-CM | POA: Insufficient documentation

## 2014-01-03 DIAGNOSIS — I4891 Unspecified atrial fibrillation: Secondary | ICD-10-CM | POA: Insufficient documentation

## 2014-01-03 DIAGNOSIS — Z950 Presence of cardiac pacemaker: Secondary | ICD-10-CM | POA: Insufficient documentation

## 2014-01-03 DIAGNOSIS — Z87891 Personal history of nicotine dependence: Secondary | ICD-10-CM | POA: Insufficient documentation

## 2014-01-03 DIAGNOSIS — Z79899 Other long term (current) drug therapy: Secondary | ICD-10-CM | POA: Insufficient documentation

## 2014-01-03 DIAGNOSIS — Z862 Personal history of diseases of the blood and blood-forming organs and certain disorders involving the immune mechanism: Secondary | ICD-10-CM | POA: Insufficient documentation

## 2014-01-03 DIAGNOSIS — E785 Hyperlipidemia, unspecified: Secondary | ICD-10-CM | POA: Insufficient documentation

## 2014-01-03 DIAGNOSIS — E119 Type 2 diabetes mellitus without complications: Secondary | ICD-10-CM | POA: Insufficient documentation

## 2014-01-03 DIAGNOSIS — E079 Disorder of thyroid, unspecified: Secondary | ICD-10-CM | POA: Insufficient documentation

## 2014-01-03 DIAGNOSIS — J069 Acute upper respiratory infection, unspecified: Secondary | ICD-10-CM | POA: Insufficient documentation

## 2014-01-03 DIAGNOSIS — I509 Heart failure, unspecified: Secondary | ICD-10-CM | POA: Insufficient documentation

## 2014-01-03 DIAGNOSIS — I10 Essential (primary) hypertension: Secondary | ICD-10-CM | POA: Insufficient documentation

## 2014-01-03 DIAGNOSIS — E7800 Pure hypercholesterolemia, unspecified: Secondary | ICD-10-CM | POA: Insufficient documentation

## 2014-01-03 DIAGNOSIS — Z8711 Personal history of peptic ulcer disease: Secondary | ICD-10-CM | POA: Insufficient documentation

## 2014-01-03 DIAGNOSIS — R062 Wheezing: Secondary | ICD-10-CM | POA: Insufficient documentation

## 2014-01-03 MED ORDER — ALBUTEROL SULFATE HFA 108 (90 BASE) MCG/ACT IN AERS
1.0000 | INHALATION_SPRAY | RESPIRATORY_TRACT | Status: DC | PRN
  Administered 2014-01-03: 2 via RESPIRATORY_TRACT
  Filled 2014-01-03: qty 6.7

## 2014-01-03 MED ORDER — HYDROCODONE-HOMATROPINE 5-1.5 MG/5ML PO SYRP: 5.0000 mL | ORAL_SOLUTION | Freq: Four times a day (QID) | ORAL | Status: DC | PRN

## 2014-01-03 MED ORDER — AZITHROMYCIN 250 MG PO TABS: 250.0000 mg | ORAL_TABLET | Freq: Every day | ORAL | Status: DC

## 2014-01-03 MED ORDER — ACETAMINOPHEN 325 MG PO TABS
650.0000 mg | ORAL_TABLET | Freq: Once | ORAL | Status: AC
  Administered 2014-01-03: 650 mg via ORAL
  Filled 2014-01-03: qty 2

## 2014-01-03 NOTE — ED Provider Notes (Signed)
CSN: [id number]     Arrival date & time 01/03/14  1329 History   First MD Initiated Contact with Patient 01/03/14 1350     Chief Complaint  Patient presents with  . Cough     (Consider location/radiation/quality/duration/timing/severity/associated sxs/prior Treatment) Patient is a 80 y.o. female presenting with cough. The history is provided by the patient.  Cough Cough characteristics:  Productive and hacking Sputum characteristics:  Yellow and brown Severity:  Moderate Onset quality:  Gradual Duration:  3 days Timing:  Constant Progression:  Worsening Chronicity:  New Smoker: no   Context: upper respiratory infection   Relieved by:  Nothing Worsened by:  Lying down Associated symptoms: headaches, rhinorrhea and wheezing   Associated symptoms: no chest pain, no myalgias, no shortness of breath and no sinus congestion     Past Medical History  Diagnosis Date  . Hypertension     essential  . Diabetes mellitus   . CHF (congestive heart failure)     compensated  . Hyperlipidemia   . Permanent atrial fibrillation     on coumadin  . Tachycardia-bradycardia syndrome     with single chamber ventricular pacemaker  . Hypercholesteremia     with intolerance to statin therapy  . Aortic stenosis   . History of peptic ulcer disease   . Peripheral neuropathy   . Thrombocytopenia   . Anemia associated with acute blood loss 03/11/2007   . Gastrointestinal bleed 03/11/2007   . Duodenal ulcer 03/11/2007   . Thyroid disease    Past Surgical History  Procedure Laterality Date  . Shoulder surgery  12/07/2004  . Pacemaker insertion  06/18/2008    SJM Zephyr XL SR implanted by Dr Reyes Ivan  . Breast biopsy  02/22/2007     left  . Cardioversion  05/16/2005    Electrical cardioversion   . Esophagogastroduodenoscopy  03/12/2007     Esophagogastroduodenoscopy with control of bleeding   Family History  Problem Relation Age of Onset  . Heart disease Father   . Emphysema Father   .  Stroke Sister     cerebral hemorrhage  . Breast cancer Sister   . Heart disease Sister   . Heart failure Father   . Coronary artery disease Father   . Heart failure Sister    History  Substance Use Topics  . Smoking status: Former Smoker    Types: Cigarettes    Quit date: 03/06/1961  . Smokeless tobacco: Not on file  . Alcohol Use: No   OB History   Grav Para Term Preterm Abortions TAB SAB Ect Mult Living                 Review of Systems  HENT: Positive for rhinorrhea.   Respiratory: Positive for cough and wheezing. Negative for shortness of breath.   Cardiovascular: Negative for chest pain.  Musculoskeletal: Negative for myalgias.  Neurological: Positive for headaches.  All other systems reviewed and are negative.      Allergies  Aspirin; Codeine; Lipitor; and Pravachol  Home Medications   Current Outpatient Rx  Name  Route  Sig  Dispense  Refill  . amoxicillin (AMOXIL) 500 MG capsule      For dental procedures only         . calcium carbonate (OS-CAL) 600 MG TABS   Oral   Take 1,200 mg by mouth daily.           . digoxin (LANOXIN) 0.25 MG tablet      TAKE ONE-HALF  TABLET BY MOUTH EVERY DAY OR  AS  DIRECTED   120 tablet   0   . diltiazem (CARDIZEM CD) 240 MG 24 hr capsule      TAKE ONE CAPSULE BY MOUTH EVERY DAY   90 capsule   1   . furosemide (LASIX) 40 MG tablet      Take one tablet alternating with 2 tablets   135 tablet   11   . gabapentin (NEURONTIN) 300 MG capsule      Daily. 1 in the am and 3 in the pm         . levothyroxine (SYNTHROID, LEVOTHROID) 50 MCG tablet   Oral   Take 50 mcg by mouth daily.           Marland Kitchen lisinopril (PRINIVIL,ZESTRIL) 2.5 MG tablet   Oral   Take 2.5 mg by mouth daily.         . metFORMIN (GLUCOPHAGE) 1000 MG tablet      TAKE ONE TABLET BY MOUTH TWICE DAILY WITH MEALS   180 tablet   0     Patient needs to get further refills from primary  ...   . metoprolol (LOPRESSOR) 100 MG tablet       TAKE ONE TABLET BY MOUTH TWICE DAILY   180 tablet   1   . Multiple Vitamins-Minerals (VISION-VITE PRESERVE PO)   Oral   Take 1 capsule by mouth 2 (two) times daily.         . simvastatin (ZOCOR) 20 MG tablet      1/2 tablet daily         . warfarin (COUMADIN) 5 MG tablet      TAKE AS DIRECTED BY  COUMADIN  CLINIC   40 tablet   1    BP 134/76  Pulse 73  Temp(Src) 98.3 F (36.8 C)  Resp 20  Ht 5\' 6"  (1.676 m)  Wt 150 lb (68.04 kg)  BMI 24.22 kg/m2  SpO2 95% Physical Exam  Nursing note and vitals reviewed. Constitutional: She is oriented to person, place, and time. She appears well-developed and well-nourished. No distress.  HENT:  Head: Normocephalic and atraumatic.  Right Ear: Tympanic membrane and ear canal normal.  Left Ear: Tympanic membrane and ear canal normal.  Nose: Mucosal edema present.  Mouth/Throat: Oropharynx is clear and moist. No oropharyngeal exudate, posterior oropharyngeal edema, posterior oropharyngeal erythema or tonsillar abscesses.  Eyes: Conjunctivae and EOM are normal. Pupils are equal, round, and reactive to light.  Neck: Normal range of motion. Neck supple.  Cardiovascular: Normal rate, regular rhythm and intact distal pulses.   No murmur heard. Pulmonary/Chest: Effort normal and breath sounds normal. No respiratory distress. She has no wheezes. She has no rales.  Constant cough  Abdominal: Soft. She exhibits no distension. There is no tenderness. There is no rebound and no guarding.  Musculoskeletal: Normal range of motion. She exhibits no edema and no tenderness.  Lymphadenopathy:    She has no cervical adenopathy.  Neurological: She is alert and oriented to person, place, and time.  Skin: Skin is warm and dry. No rash noted. No erythema.  Psychiatric: She has a normal mood and affect. Her behavior is normal.    ED Course  Procedures (including critical care time) Labs Review Labs Reviewed - No data to display Imaging Review Dg  Chest 2 View  01/03/2014   CLINICAL DATA:  COUGH  EXAM: CHEST  2 VIEW  COMPARISON:  DG CHEST 2 VIEW dated  06/19/2008  FINDINGS: The heart size and mediastinal contours are within normal limits. Both lungs are clear. Degenerative changes are appreciated within the shoulders and lower thoracic spine. Left chest wall cardiac pacing unit the tip projecting region of the right ventricle.  IMPRESSION: No active cardiopulmonary disease.   Electronically Signed   By: Salome HolmesHector  Cooper M.D.   On: 01/03/2014 14:22     EKG Interpretation None      MDM   Final diagnoses:  None    Pt with symptoms consistent with URI over the last 3 days which is worsening and now coughing all the time and unable to sleep.  Pt with low grade temp of 99-100 today.  Productive cough.  No hx of lung disease and no sx concerning for CHF.  Well appearing here.  No signs of breathing difficulty  No signs of pharyngitis, otitis or abnormal abdominal findings.  Coughing persistently on exam. CXR wnl and pt to return with any further problems.  Will treat with azithromycin, albuterol, cough suppressant and f/u with PCP.  Pt had coumadin checked 2 days ago and it was 2.5.  Pt will call clinic for f/u testing after abx.     Gwyneth SproutWhitney Cambell Stanek, MD 01/03/14 1428

## 2014-01-03 NOTE — ED Notes (Signed)
Reports fever, productive cough, clear runny nose since yesterday.  Phlegm is brown/yellow.  Also reports a small headache and 'I never ever have a headache.'

## 2014-01-03 NOTE — Discharge Instructions (Signed)

## 2014-01-07 ENCOUNTER — Ambulatory Visit (INDEPENDENT_AMBULATORY_CARE_PROVIDER_SITE_OTHER): Payer: Medicare Other | Admitting: Pharmacist

## 2014-01-07 DIAGNOSIS — I4891 Unspecified atrial fibrillation: Secondary | ICD-10-CM

## 2014-01-07 DIAGNOSIS — Z5181 Encounter for therapeutic drug level monitoring: Secondary | ICD-10-CM

## 2014-01-07 DIAGNOSIS — Z7901 Long term (current) use of anticoagulants: Secondary | ICD-10-CM

## 2014-01-07 LAB — POCT INR: INR: 2.8

## 2014-01-20 ENCOUNTER — Ambulatory Visit (INDEPENDENT_AMBULATORY_CARE_PROVIDER_SITE_OTHER): Payer: Medicare Other | Admitting: Pharmacist

## 2014-01-20 DIAGNOSIS — I4891 Unspecified atrial fibrillation: Secondary | ICD-10-CM

## 2014-01-20 DIAGNOSIS — Z5181 Encounter for therapeutic drug level monitoring: Secondary | ICD-10-CM

## 2014-01-20 DIAGNOSIS — Z7901 Long term (current) use of anticoagulants: Secondary | ICD-10-CM

## 2014-01-20 LAB — POCT INR: INR: 2.9

## 2014-01-21 ENCOUNTER — Other Ambulatory Visit: Payer: Self-pay | Admitting: Cardiology

## 2014-02-04 ENCOUNTER — Other Ambulatory Visit: Payer: Self-pay | Admitting: *Deleted

## 2014-02-04 MED ORDER — METOPROLOL TARTRATE 100 MG PO TABS: ORAL_TABLET | ORAL | Status: DC

## 2014-02-17 ENCOUNTER — Ambulatory Visit (INDEPENDENT_AMBULATORY_CARE_PROVIDER_SITE_OTHER): Payer: Medicare Other

## 2014-02-17 DIAGNOSIS — I4891 Unspecified atrial fibrillation: Secondary | ICD-10-CM

## 2014-02-17 DIAGNOSIS — Z5181 Encounter for therapeutic drug level monitoring: Secondary | ICD-10-CM

## 2014-02-17 DIAGNOSIS — Z7901 Long term (current) use of anticoagulants: Secondary | ICD-10-CM

## 2014-02-17 LAB — POCT INR: INR: 2.9

## 2014-03-16 ENCOUNTER — Telehealth: Payer: Self-pay | Admitting: Cardiology

## 2014-03-16 NOTE — Telephone Encounter (Signed)
New message      [place] is in [place] and want an order for a PT/INR faxed to [phone].  [place] and she does not know when she will be home.  He had a major stroke.  Call pt if there is a problem.

## 2014-03-16 NOTE — Telephone Encounter (Signed)
Telephoned pt, Rx sent to [place] for INR due to spouse being currently admitted to ICU for CVA. Pt placed in lab book.

## 2014-03-19 ENCOUNTER — Ambulatory Visit: Payer: Medicare Other | Admitting: Cardiology

## 2014-03-24 ENCOUNTER — Telehealth: Payer: Self-pay | Admitting: Pharmacist Clinician (PhC)/ Clinical Pharmacy Specialist

## 2014-03-24 ENCOUNTER — Ambulatory Visit (INDEPENDENT_AMBULATORY_CARE_PROVIDER_SITE_OTHER): Payer: Medicare Other | Admitting: Interventional Cardiology

## 2014-03-24 DIAGNOSIS — Z5181 Encounter for therapeutic drug level monitoring: Secondary | ICD-10-CM

## 2014-03-24 LAB — PROTIME-INR: INR: 2 — AB (ref 0.9–1.1)

## 2014-03-24 NOTE — Telephone Encounter (Signed)
Pt called this am, wanting to know her INR.  Pt gave phone number for lab in [place] where she had blood drawn [phone]) and asked that we speak with Olegario MessierKathy.   Pt cell # [phone]

## 2014-03-24 NOTE — Telephone Encounter (Signed)
Tried calling lab.  No answer.  Had tried after speaking with patient.  Technician at lab stated there was a problem with the blood sample, we should talk to [place] who would be in at 11am.

## 2014-03-26 NOTE — Telephone Encounter (Signed)
Labs received, see anticoag note

## 2014-04-07 ENCOUNTER — Encounter: Payer: Self-pay | Admitting: Internal Medicine

## 2014-04-20 ENCOUNTER — Telehealth: Payer: Self-pay | Admitting: Cardiology

## 2014-04-20 MED ORDER — FUROSEMIDE 40 MG PO TABS: ORAL_TABLET | ORAL | Status: DC

## 2014-04-20 NOTE — Telephone Encounter (Signed)
New message     [place] still [place].  She needs an order faxed to have her PT/INR checked there.  Please fax to [phone].  She will be there at least 2 more weeks

## 2014-04-20 NOTE — Telephone Encounter (Signed)
Prescription faxed to lab in [place].  Patient notified.  To recheck INR this week.

## 2014-04-20 NOTE — Telephone Encounter (Signed)
New message      Please fax presc for generic lasix.  [place] is still in [place].  Please fax to [phone].  Her husband had a stroke 2 months ago.  She will take the presc to the nearest pharmacist.

## 2014-04-20 NOTE — Telephone Encounter (Signed)
Sent Rx as requested.  

## 2014-04-22 ENCOUNTER — Ambulatory Visit: Payer: Medicare Other | Admitting: Cardiology

## 2014-04-22 LAB — PROTIME-INR: INR: 1.5 — AB (ref 0.9–1.1)

## 2014-04-24 NOTE — Telephone Encounter (Signed)
Called spoke with pt advised INR results have not yet been received.  Pt states she had bloodwork drawn on Wednesday 04/22/14.  Pt is going to call lab in [place] to request results be sent.  Will await results.

## 2014-04-24 NOTE — Telephone Encounter (Signed)
Follow up     [place]

## 2014-04-27 ENCOUNTER — Ambulatory Visit (INDEPENDENT_AMBULATORY_CARE_PROVIDER_SITE_OTHER): Payer: Medicare Other | Admitting: Pharmacist

## 2014-04-27 DIAGNOSIS — Z5181 Encounter for therapeutic drug level monitoring: Secondary | ICD-10-CM

## 2014-04-27 DIAGNOSIS — I4891 Unspecified atrial fibrillation: Secondary | ICD-10-CM

## 2014-04-28 NOTE — Telephone Encounter (Signed)
Result received and addressed, see anticoagulation note in Epic 04/27/14.

## 2014-05-05 ENCOUNTER — Telehealth: Payer: Self-pay | Admitting: Cardiology

## 2014-05-05 MED ORDER — DIGOXIN 250 MCG PO TABS: ORAL_TABLET | ORAL | Status: DC

## 2014-05-05 NOTE — Telephone Encounter (Signed)
New message     Pt is in [place] because husband had a stroke----[facility] to refill digoxin.  This is the phone number [phone] she does not know the fax number.  Call if any problems.

## 2014-05-05 NOTE — Telephone Encounter (Signed)
Sent to pharmacy as requested 

## 2014-05-11 LAB — PROTIME-INR: INR: 2.2 — AB (ref 0.9–1.1)

## 2014-05-12 ENCOUNTER — Ambulatory Visit (INDEPENDENT_AMBULATORY_CARE_PROVIDER_SITE_OTHER): Payer: Medicare Other | Admitting: Cardiovascular Disease

## 2014-05-12 ENCOUNTER — Other Ambulatory Visit: Payer: Self-pay | Admitting: *Deleted

## 2014-05-12 DIAGNOSIS — Z5181 Encounter for therapeutic drug level monitoring: Secondary | ICD-10-CM

## 2014-05-12 MED ORDER — METOPROLOL TARTRATE 100 MG PO TABS: ORAL_TABLET | ORAL | Status: DC

## 2014-05-12 MED ORDER — WARFARIN SODIUM 5 MG PO TABS: ORAL_TABLET | ORAL | Status: DC

## 2014-06-01 ENCOUNTER — Ambulatory Visit (INDEPENDENT_AMBULATORY_CARE_PROVIDER_SITE_OTHER): Payer: Medicare Other | Admitting: *Deleted

## 2014-06-01 DIAGNOSIS — I4891 Unspecified atrial fibrillation: Secondary | ICD-10-CM

## 2014-06-01 DIAGNOSIS — I482 Chronic atrial fibrillation, unspecified: Secondary | ICD-10-CM

## 2014-06-01 LAB — MDC_IDC_ENUM_SESS_TYPE_INCLINIC
Battery Impedance: 1000 Ohm — CL
Battery Voltage: 2.8 V
Date Time Interrogation Session: 20150831145234
Implantable Pulse Generator Model: 5626
Implantable Pulse Generator Serial Number: 2036664
Lead Channel Pacing Threshold Amplitude: 1 V
Lead Channel Pacing Threshold Pulse Width: 0.4 ms
Lead Channel Setting Pacing Amplitude: 2.5 V
Lead Channel Setting Pacing Pulse Width: 0.4 ms
Lead Channel Setting Sensing Sensitivity: 2 mV
MDC IDC MSMT LEADCHNL RV IMPEDANCE VALUE: 505 Ohm
MDC IDC MSMT LEADCHNL RV SENSING INTR AMPL: 12 mV
MDC IDC STAT BRADY RV PERCENT PACED: 14 %

## 2014-06-01 NOTE — Progress Notes (Signed)
Pacemaker check in clinic. Normal device function. Thresholds, sensing, impedances consistent with previous measurements. Device programmed to maximize longevity. No high ventricular rates noted. Device programmed at appropriate safety margins. Histogram distribution appropriate for patient activity level. Device programmed to optimize intrinsic conduction. Estimated longevity 8 years. ROV in February with Dr. Johney Frame.

## 2014-06-02 ENCOUNTER — Ambulatory Visit (INDEPENDENT_AMBULATORY_CARE_PROVIDER_SITE_OTHER): Payer: Medicare Other | Admitting: *Deleted

## 2014-06-02 ENCOUNTER — Encounter: Payer: Self-pay | Admitting: Cardiology

## 2014-06-02 ENCOUNTER — Ambulatory Visit (INDEPENDENT_AMBULATORY_CARE_PROVIDER_SITE_OTHER): Payer: Medicare Other | Admitting: Cardiology

## 2014-06-02 VITALS — BP 130/60 | HR 60 | Ht 66.0 in | Wt 141.0 lb

## 2014-06-02 DIAGNOSIS — I4891 Unspecified atrial fibrillation: Secondary | ICD-10-CM

## 2014-06-02 DIAGNOSIS — I495 Sick sinus syndrome: Secondary | ICD-10-CM

## 2014-06-02 DIAGNOSIS — I119 Hypertensive heart disease without heart failure: Secondary | ICD-10-CM

## 2014-06-02 DIAGNOSIS — I350 Nonrheumatic aortic (valve) stenosis: Secondary | ICD-10-CM

## 2014-06-02 DIAGNOSIS — Z7901 Long term (current) use of anticoagulants: Secondary | ICD-10-CM

## 2014-06-02 DIAGNOSIS — I359 Nonrheumatic aortic valve disorder, unspecified: Secondary | ICD-10-CM

## 2014-06-02 DIAGNOSIS — I10 Essential (primary) hypertension: Secondary | ICD-10-CM

## 2014-06-02 DIAGNOSIS — I482 Chronic atrial fibrillation, unspecified: Secondary | ICD-10-CM

## 2014-06-02 DIAGNOSIS — Z5181 Encounter for therapeutic drug level monitoring: Secondary | ICD-10-CM

## 2014-06-02 LAB — POCT INR: INR: 1.5

## 2014-06-02 NOTE — Assessment & Plan Note (Signed)
She has essential hypertension.  Her blood pressures should be taken from the right arm which is always higher than her left arm.

## 2014-06-02 NOTE — Patient Instructions (Signed)
Your physician recommends that you continue on your current medications as directed. Please refer to the Current Medication list given to you today.  Your physician recommends that you schedule a follow-up appointment in: 4 month ov/ekg 

## 2014-06-02 NOTE — Progress Notes (Signed)
[name] Date of Birth:  07-05-1927 [facility] [address] [place], [place]  [address] [phone]        Fax   [phone]   History of Present Illness: This pleasant 80 year old woman is seen for a scheduled followup office visit. She has a past history of established atrial fibrillation. She has a history of aortic stenosis which on echocardiogram on 12/29/10 was stable and not severe. She does not have any history of ischemic heart disease. She had a normal nuclear stress test 01/21/07. She has a history of tachybradycardia syndrome and has a functioning pacemaker. Since last visit she has been doing well. No new cardiac symptoms. She has a history of diabetes mellitus and is followed by Dr. Su Hilt. She has diabetic neuropathy and goes to [facility] once a year to see Dr. Conrad [place]. Her lipids and blood work are followed by Dr. Su Hilt.   The patient has a known soft left carotid bruit. She had carotid duplex ultrasound on 06/13/13 which showed less than 40% stenosis bilaterally. The patient and her husband went to a family wedding in [place] in June.  Unfortunately, her husband fell on the day of the wedding and hit his head and developed a subdural hematoma requiring surgery he has been in and out of the hospital all summer.  They were in [place] for more than a month and then came back to the [place] area and he has been in and out of [place] long.  He will be going to a rehabilitation facility.  He is unable to get out of bed and his wife can no longer handle him at home.   Current Outpatient Prescriptions  Medication Sig Dispense Refill  . amoxicillin (AMOXIL) 500 MG capsule For dental procedures only      . calcium carbonate (OS-CAL) 600 MG TABS Take 1,200 mg by mouth daily.        . digoxin (LANOXIN) 0.25 MG tablet TAKE ONE-HALF TABLET BY MOUTH EVERY DAY OR  AS  DIRECTED  120 tablet  0  . diltiazem (CARDIZEM CD) 240 MG 24 hr capsule  TAKE ONE CAPSULE BY MOUTH EVERY DAY  90 capsule  1  . furosemide (LASIX) 40 MG tablet TAKE ONE TABLET BY MOUTH EVERY OTHER DAY ALTERNATING  WITH  2  TABLETS  EVERY  OTHER  DAY  135 tablet  1  . gabapentin (NEURONTIN) 300 MG capsule Daily. 1 in the am and 3 in the pm      . HYDROcodone-homatropine (HYCODAN) 5-1.5 MG/5ML syrup Take 5 mLs by mouth every 6 (six) hours as needed for cough.  120 mL  0  . levothyroxine (SYNTHROID, LEVOTHROID) 50 MCG tablet Take 50 mcg by mouth daily.        Marland Kitchen lisinopril (PRINIVIL,ZESTRIL) 2.5 MG tablet Take 2.5 mg by mouth daily.      . metFORMIN (GLUCOPHAGE) 1000 MG tablet take 1/2 ( ) bid      . metoprolol (LOPRESSOR) 100 MG tablet TAKE ONE TABLET BY MOUTH TWICE DAILY  180 tablet  0  . Multiple Vitamins-Minerals (VISION-VITE PRESERVE PO) Take 1 capsule by mouth 2 (two) times daily.      . simvastatin (ZOCOR) 20 MG tablet 1/2 tablet daily      . warfarin (COUMADIN) 5 MG tablet TAKE AS DIRECTED BY  COUMADIN  CLINIC  40 tablet  1   No current facility-administered medications for this visit.    Allergies  Allergen  Reactions  . Aspirin Other (See Comments)    Gi bleeding  . Codeine   . Lipitor [Atorvastatin Calcium]   . Pravachol     Patient Active Problem List   Diagnosis Date Noted  . Aortic stenosis 03/08/2011    Priority: High  . ATRIAL FIBRILLATION, CHRONIC 10/28/2010    Priority: High  . Encounter for therapeutic drug monitoring 11/24/2013  . Osteoarthritis 11/24/2013  . Left carotid bruit 06/11/2013  . Essential hypertension 03/08/2011  . Type II or unspecified type diabetes mellitus without mention of complication, uncontrolled 03/08/2011  . HYPERLIPIDEMIA 10/28/2010  . BRADYCARDIA-TACHYCARDIA SYNDROME 10/28/2010    History  Smoking status  . Former Smoker  . Types: Cigarettes  . Quit date: 03/06/1961  Smokeless tobacco  . Not on file    History  Alcohol Use No    Family History  Problem Relation Age of Onset  . Heart disease  Father   . Emphysema Father   . Stroke Sister     cerebral hemorrhage  . Breast cancer Sister   . Heart disease Sister   . Heart failure Father   . Coronary artery disease Father   . Heart failure Sister     Review of Systems: Constitutional: no fever chills diaphoresis or fatigue or change in weight.  Head and neck: no hearing loss, no epistaxis, no photophobia or visual disturbance. Respiratory: No cough, shortness of breath or wheezing. Cardiovascular: No chest pain peripheral edema, palpitations. Gastrointestinal: No abdominal distention, no abdominal pain, no change in bowel habits hematochezia or melena. Genitourinary: No dysuria, no frequency, no urgency, no nocturia. Musculoskeletal:No arthralgias, no back pain, no gait disturbance or myalgias. Neurological: No dizziness, no headaches, no numbness, no seizures, no syncope, no weakness, no tremors. Hematologic: No lymphadenopathy, no easy bruising. Psychiatric: No confusion, no hallucinations, no sleep disturbance.    Physical Exam: Filed Vitals:   06/02/14 1539  BP: 130/60  Pulse:    blood pressure is 130/60 taken by me in the right arm.  Left arm is 114/60. The patient appears to be in no distress.  Head and neck exam reveals that the pupils are equal and reactive.  The extraocular movements are full.  There is no scleral icterus.  Mouth and pharynx are benign.  No lymphadenopathy.  Soft left carotid bruit..  The jugular venous pressure is normal.  Thyroid is not enlarged or tender.  Chest is clear to percussion and auscultation.  No rales or rhonchi.  Expansion of the chest is symmetrical.  Heart reveals no abnormal lift or heave.  Pulse is irregularly irregular  First and second heart sounds are normal.  There is grade 2/6 harsh systolic ejection murmur of aortic stenosis.  The abdomen is soft and nontender.  Bowel sounds are normoactive.  There is no hepatosplenomegaly or mass.  There are no abdominal  bruits.  Extremities reveal no phlebitis or edema.  Pedal pulses are good.  There is no cyanosis or clubbing.  Neurologic exam is normal strength and no lateralizing weakness.  No sensory deficits.  Integument reveals no rash    Assessment / Plan: 1. permanent atrial fibrillation 2. essential hypertension without heart failure 3. tachybradycardia syndrome with functioning  Pacemaker 4. chronic Coumadin anticoagulation 5. mild to moderate aortic stenosis 6. diabetes mellitus followed by Dr. Su Hilt.  Diabetic neuropathy followed by Dr. Conrad [place] at [facility] 7. dyslipidemia followed by Dr. Su Hilt 8. soft left carotid bruit with carotid duplex ultrasound on 06/13/13 showing less than 40% stenosis  bilaterally 9. prolonged stress with her husband's illness  Disposition: Continue same medication.  Recheck in 4 months for office visit and EKG

## 2014-06-02 NOTE — Assessment & Plan Note (Signed)
The patient has permanent atrial fibrillation.  She has not had any TIA or stroke symptoms.  Her prothrombin times have been more erratic December because of her spending so much time at the hospital with her husband.  She has been away from her usual diet.

## 2014-06-02 NOTE — Assessment & Plan Note (Signed)
The patient has tachybradycardia syndrome.  She has a functioning pacemaker.  She has not been having dizzy spells or syncope.

## 2014-06-09 ENCOUNTER — Encounter: Payer: Self-pay | Admitting: Internal Medicine

## 2014-06-16 ENCOUNTER — Ambulatory Visit (INDEPENDENT_AMBULATORY_CARE_PROVIDER_SITE_OTHER): Payer: Medicare Other | Admitting: *Deleted

## 2014-06-16 DIAGNOSIS — Z7901 Long term (current) use of anticoagulants: Secondary | ICD-10-CM

## 2014-06-16 DIAGNOSIS — I4891 Unspecified atrial fibrillation: Secondary | ICD-10-CM

## 2014-06-16 DIAGNOSIS — Z5181 Encounter for therapeutic drug level monitoring: Secondary | ICD-10-CM

## 2014-06-16 LAB — POCT INR: INR: 2.8

## 2014-06-19 ENCOUNTER — Other Ambulatory Visit (HOSPITAL_COMMUNITY): Payer: Self-pay | Admitting: *Deleted

## 2014-06-19 DIAGNOSIS — I6529 Occlusion and stenosis of unspecified carotid artery: Secondary | ICD-10-CM

## 2014-06-25 ENCOUNTER — Ambulatory Visit (HOSPITAL_COMMUNITY): Payer: Medicare Other | Attending: Internal Medicine | Admitting: Cardiology

## 2014-06-25 DIAGNOSIS — E119 Type 2 diabetes mellitus without complications: Secondary | ICD-10-CM | POA: Insufficient documentation

## 2014-06-25 DIAGNOSIS — I6529 Occlusion and stenosis of unspecified carotid artery: Secondary | ICD-10-CM | POA: Diagnosis not present

## 2014-06-25 DIAGNOSIS — Z87891 Personal history of nicotine dependence: Secondary | ICD-10-CM | POA: Insufficient documentation

## 2014-06-25 DIAGNOSIS — I10 Essential (primary) hypertension: Secondary | ICD-10-CM | POA: Diagnosis not present

## 2014-06-25 DIAGNOSIS — E785 Hyperlipidemia, unspecified: Secondary | ICD-10-CM | POA: Insufficient documentation

## 2014-06-25 NOTE — Progress Notes (Signed)
Carotid duplex performed 

## 2014-07-01 ENCOUNTER — Telehealth: Payer: Self-pay | Admitting: Cardiology

## 2014-07-01 NOTE — Telephone Encounter (Signed)
New message ° ° °Patient returning call back to nurse.  °

## 2014-07-01 NOTE — Telephone Encounter (Signed)
Advised patient

## 2014-07-01 NOTE — Telephone Encounter (Signed)
Message copied by Burnell BlanksPRATT, Emanuell Morina B on Wed Jul 01, 2014 11:37 AM ------      Message from: Cassell ClementBRACKBILL, THOMAS      Created: Fri Jun 26, 2014  6:55 AM       Carotids are stable.  Recheck in 2 years ------

## 2014-07-06 ENCOUNTER — Ambulatory Visit (INDEPENDENT_AMBULATORY_CARE_PROVIDER_SITE_OTHER): Payer: Medicare Other | Admitting: *Deleted

## 2014-07-06 DIAGNOSIS — Z7901 Long term (current) use of anticoagulants: Secondary | ICD-10-CM

## 2014-07-06 DIAGNOSIS — Z23 Encounter for immunization: Secondary | ICD-10-CM

## 2014-07-06 DIAGNOSIS — I4891 Unspecified atrial fibrillation: Secondary | ICD-10-CM

## 2014-07-06 DIAGNOSIS — Z5181 Encounter for therapeutic drug level monitoring: Secondary | ICD-10-CM

## 2014-07-06 LAB — POCT INR: INR: 2.2

## 2014-07-10 ENCOUNTER — Other Ambulatory Visit: Payer: Self-pay

## 2014-07-10 MED ORDER — SIMVASTATIN 20 MG PO TABS: ORAL_TABLET | ORAL | Status: DC

## 2014-08-03 ENCOUNTER — Ambulatory Visit (INDEPENDENT_AMBULATORY_CARE_PROVIDER_SITE_OTHER): Payer: Medicare Other

## 2014-08-03 DIAGNOSIS — Z5181 Encounter for therapeutic drug level monitoring: Secondary | ICD-10-CM

## 2014-08-03 DIAGNOSIS — Z7901 Long term (current) use of anticoagulants: Secondary | ICD-10-CM

## 2014-08-03 DIAGNOSIS — I4891 Unspecified atrial fibrillation: Secondary | ICD-10-CM

## 2014-08-03 LAB — POCT INR: INR: 1.6

## 2014-08-10 ENCOUNTER — Other Ambulatory Visit: Payer: Self-pay | Admitting: *Deleted

## 2014-08-10 MED ORDER — METOPROLOL TARTRATE 100 MG PO TABS: ORAL_TABLET | ORAL | Status: DC

## 2014-08-17 ENCOUNTER — Ambulatory Visit (INDEPENDENT_AMBULATORY_CARE_PROVIDER_SITE_OTHER): Payer: Medicare Other | Admitting: *Deleted

## 2014-08-17 DIAGNOSIS — I4891 Unspecified atrial fibrillation: Secondary | ICD-10-CM

## 2014-08-17 DIAGNOSIS — Z5181 Encounter for therapeutic drug level monitoring: Secondary | ICD-10-CM

## 2014-08-17 DIAGNOSIS — Z7901 Long term (current) use of anticoagulants: Secondary | ICD-10-CM

## 2014-08-17 LAB — POCT INR: INR: 2.4

## 2014-09-11 ENCOUNTER — Other Ambulatory Visit: Payer: Self-pay | Admitting: *Deleted

## 2014-09-11 MED ORDER — DILTIAZEM HCL ER COATED BEADS 240 MG PO CP24: ORAL_CAPSULE | ORAL | Status: DC

## 2014-09-14 ENCOUNTER — Ambulatory Visit (INDEPENDENT_AMBULATORY_CARE_PROVIDER_SITE_OTHER): Payer: Medicare Other | Admitting: *Deleted

## 2014-09-14 DIAGNOSIS — Z5181 Encounter for therapeutic drug level monitoring: Secondary | ICD-10-CM

## 2014-09-14 DIAGNOSIS — I4891 Unspecified atrial fibrillation: Secondary | ICD-10-CM

## 2014-09-14 DIAGNOSIS — Z7901 Long term (current) use of anticoagulants: Secondary | ICD-10-CM

## 2014-09-14 LAB — POCT INR: INR: 1.9

## 2014-09-28 ENCOUNTER — Ambulatory Visit (INDEPENDENT_AMBULATORY_CARE_PROVIDER_SITE_OTHER): Payer: Medicare Other | Admitting: Pharmacist

## 2014-09-28 DIAGNOSIS — I4891 Unspecified atrial fibrillation: Secondary | ICD-10-CM

## 2014-09-28 DIAGNOSIS — Z7901 Long term (current) use of anticoagulants: Secondary | ICD-10-CM

## 2014-09-28 DIAGNOSIS — Z5181 Encounter for therapeutic drug level monitoring: Secondary | ICD-10-CM

## 2014-09-28 LAB — POCT INR: INR: 2.2

## 2014-10-07 ENCOUNTER — Ambulatory Visit (INDEPENDENT_AMBULATORY_CARE_PROVIDER_SITE_OTHER): Payer: Medicare Other | Admitting: Cardiology

## 2014-10-07 ENCOUNTER — Encounter: Payer: Self-pay | Admitting: Cardiology

## 2014-10-07 VITALS — BP 110/56 | HR 63 | Ht 66.0 in | Wt 145.0 lb

## 2014-10-07 DIAGNOSIS — I350 Nonrheumatic aortic (valve) stenosis: Secondary | ICD-10-CM

## 2014-10-07 DIAGNOSIS — I482 Chronic atrial fibrillation, unspecified: Secondary | ICD-10-CM

## 2014-10-07 DIAGNOSIS — I10 Essential (primary) hypertension: Secondary | ICD-10-CM

## 2014-10-07 NOTE — Patient Instructions (Signed)
Your physician recommends that you continue on your current medications as directed. Please refer to the Current Medication list given to you today.  Your physician wants you to follow-up in: 4 month ov You will receive a reminder letter in the mail two months in advance. If you don't receive a letter, please call our office to schedule the follow-up appointment.  

## 2014-10-07 NOTE — Progress Notes (Addendum)
[name] Date of Birth:  1927/07/01 [facility] [address] [place], [place]  [id number] [phone]        Fax   [phone]   History of Present Illness: This pleasant 81 year old woman is seen for a scheduled followup office visit. She has a past history of established atrial fibrillation. She has a history of aortic stenosis which on echocardiogram on 12/29/10 was stable and not severe. She does not have any history of ischemic heart disease. She had a normal nuclear stress test 01/21/07. She has a history of tachybradycardia syndrome and has a functioning pacemaker. Since last visit she has been doing well. No new cardiac symptoms. She has a history of diabetes mellitus and is followed by Dr. Su Hiltoberts. She has diabetic neuropathy and [facility] a [facility] to see Dr. Conrad BurlingtonLefkowitz. Her lipids and blood work are followed by Dr. Su Hiltoberts.   The patient has a known soft left carotid bruit. She had carotid duplex ultrasound on 06/13/13 which showed less than 40% stenosis bilaterally. The patient is a recent widow.  Her husband died at the end of November 2015 following a lengthy illness.  Overall she appears to be handling the grief process okay. 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  She is receiving injections and already she can tell a difference in her vision for the better.  Current Outpatient Prescriptions  Medication Sig Dispense Refill  . amoxicillin (AMOXIL) 500 MG capsule For dental procedures only    . calcium carbonate (OS-CAL) 600 MG TABS Take 1,200 mg by mouth daily.      . digoxin (LANOXIN) 0.25 MG tablet TAKE ONE-HALF TABLET BY MOUTH EVERY DAY OR  AS  DIRECTED 120 tablet 0  . diltiazem (CARDIZEM CD) 240 MG 24 hr capsule TAKE ONE CAPSULE BY MOUTH EVERY DAY 90 capsule 0  . furosemide (LASIX) 40 MG tablet TAKE ONE TABLET BY MOUTH EVERY OTHER DAY ALTERNATING  WITH  2  TABLETS  EVERY  OTHER  DAY 135 tablet 1  .  gabapentin (NEURONTIN) 300 MG capsule Daily. 1 in the am and 3 in the pm    . HYDROcodone-homatropine (HYCODAN) 5-1.5 MG/5ML syrup Take 5 mLs by mouth every 6 (six) hours as needed for cough. 120 mL 0  . levothyroxine (SYNTHROID, LEVOTHROID) 50 MCG tablet Take 50 mcg by mouth daily.      Marland Kitchen. lisinopril (PRINIVIL,ZESTRIL) 2.5 MG tablet Take 2.5 mg by mouth daily.    . metFORMIN (GLUCOPHAGE) 1000 MG tablet take 1/2 (500mg ) bid    . metoprolol (LOPRESSOR) 100 MG tablet TAKE ONE TABLET BY MOUTH TWICE DAILY 180 tablet 1  . Multiple Vitamins-Minerals (VISION-VITE PRESERVE PO) Take 1 capsule by mouth 2 (two) times daily.    . simvastatin (ZOCOR) 20 MG tablet 1/2 tablet daily 30 tablet 6  . warfarin (COUMADIN) 5 MG tablet TAKE AS DIRECTED BY  COUMADIN  CLINIC 40 tablet 1   No current facility-administered medications for this visit.    Allergies  Allergen Reactions  . Aspirin Other (See Comments)    Gi bleeding  . Codeine   . Lipitor [Atorvastatin Calcium]   . Pravachol     Patient Active Problem List   Diagnosis Date Noted  . Aortic stenosis 03/08/2011    Priority: High  . ATRIAL FIBRILLATION, CHRONIC 10/28/2010    Priority: High  . Encounter for therapeutic drug monitoring 11/24/2013  . Osteoarthritis 11/24/2013  .  Left carotid bruit 06/11/2013  . Essential hypertension 03/08/2011  . Type II or unspecified type diabetes mellitus without mention of complication, uncontrolled 03/08/2011  . HYPERLIPIDEMIA 10/28/2010  . BRADYCARDIA-TACHYCARDIA SYNDROME 10/28/2010    History  Smoking status  . Former Smoker  . Types: Cigarettes  . Quit date: 03/06/1961  Smokeless tobacco  . Not on file    History  Alcohol Use No    Family History  Problem Relation Age of Onset  . Heart disease Father   . Emphysema Father   . Stroke Sister     cerebral hemorrhage  . Breast cancer Sister   . Heart disease Sister   . Heart failure Father   . Coronary artery disease Father   . Heart failure  Sister     Review of Systems: Constitutional: no fever chills diaphoresis or fatigue or change in weight.  Head and neck: no hearing loss, no epistaxis, no photophobia or visual disturbance. Respiratory: No cough, shortness of breath or wheezing. Cardiovascular: No chest pain peripheral edema, palpitations. Gastrointestinal: No abdominal distention, no abdominal pain, no change in bowel habits hematochezia or melena. Genitourinary: No dysuria, no frequency, no urgency, no nocturia. Musculoskeletal:No arthralgias, no back pain, no gait disturbance or myalgias. Neurological: No dizziness, no headaches, no numbness, no seizures, no syncope, no weakness, no tremors. Hematologic: No lymphadenopathy, no easy bruising. Psychiatric: No confusion, no hallucinations, no sleep disturbance.    Physical Exam: Filed Vitals:   10/07/14 1102  BP: 110/56  Pulse: 63   blood pressure is 130/60 taken by me in the right arm.  Left arm is 114/60. The patient appears to be in no distress.  Head and neck exam reveals that the pupils are equal and reactive.  The extraocular movements are full.  There is no scleral icterus.  Mouth and pharynx are benign.  No lymphadenopathy.  Soft left carotid bruit..  The jugular venous pressure is normal.  Thyroid is not enlarged or tender.  Chest is clear to percussion and auscultation.  No rales or rhonchi.  Expansion of the chest is symmetrical.  Heart reveals no abnormal lift or heave.  Pulse is irregularly irregular  First and second heart sounds are normal.  There is grade 2/6 harsh systolic ejection murmur of aortic stenosis.  The abdomen is soft and nontender.  Bowel sounds are normoactive.  There is no hepatosplenomegaly or mass.  There are no abdominal bruits.  Extremities reveal no phlebitis or edema.  Pedal pulses are good.  There is no cyanosis or clubbing.  Neurologic exam is normal strength and no lateralizing weakness.  No sensory deficits.  Integument  reveals no rash  EKG today shows atrial fibrillation with a paced ventricular rate of 65/m  Assessment / Plan: 1. permanent atrial fibrillation 2. essential hypertension without heart failure 3. tachybradycardia syndrome with functioning  Pacemaker 4. chronic Coumadin anticoagulation 5. mild to moderate aortic stenosis 6. diabetes mellitus followed by Dr. Su Hilt.  Diabetic neuropathy followed by Dr. Conrad [place] at [facility] 7. dyslipidemia followed by Dr. Su Hilt 8. soft left carotid bruit with carotid duplex ultrasound on 06/13/13 showing less than 40% stenosis bilaterally 9. prolonged stress with her husband's illness.  He finally expired after a lengthy illness at the end of November 2015  Disposition: Continue same medication.  Recheck in 4 months for office visit and EKG

## 2014-10-09 ENCOUNTER — Other Ambulatory Visit: Payer: Self-pay | Admitting: Cardiology

## 2014-10-19 ENCOUNTER — Ambulatory Visit (INDEPENDENT_AMBULATORY_CARE_PROVIDER_SITE_OTHER): Payer: Medicare Other | Admitting: Pharmacist

## 2014-10-19 DIAGNOSIS — Z7901 Long term (current) use of anticoagulants: Secondary | ICD-10-CM

## 2014-10-19 DIAGNOSIS — I4891 Unspecified atrial fibrillation: Secondary | ICD-10-CM

## 2014-10-19 DIAGNOSIS — I482 Chronic atrial fibrillation, unspecified: Secondary | ICD-10-CM

## 2014-10-19 DIAGNOSIS — Z5181 Encounter for therapeutic drug level monitoring: Secondary | ICD-10-CM

## 2014-10-19 LAB — POCT INR: INR: 1.7

## 2014-10-28 ENCOUNTER — Ambulatory Visit (INDEPENDENT_AMBULATORY_CARE_PROVIDER_SITE_OTHER): Payer: Medicare Other | Admitting: *Deleted

## 2014-10-28 DIAGNOSIS — I4891 Unspecified atrial fibrillation: Secondary | ICD-10-CM

## 2014-10-28 DIAGNOSIS — I482 Chronic atrial fibrillation, unspecified: Secondary | ICD-10-CM

## 2014-10-28 DIAGNOSIS — Z7901 Long term (current) use of anticoagulants: Secondary | ICD-10-CM

## 2014-10-28 DIAGNOSIS — Z5181 Encounter for therapeutic drug level monitoring: Secondary | ICD-10-CM

## 2014-10-28 LAB — POCT INR: INR: 1.8

## 2014-11-09 ENCOUNTER — Ambulatory Visit (INDEPENDENT_AMBULATORY_CARE_PROVIDER_SITE_OTHER): Payer: Medicare Other | Admitting: Pharmacist

## 2014-11-09 DIAGNOSIS — I482 Chronic atrial fibrillation, unspecified: Secondary | ICD-10-CM

## 2014-11-09 DIAGNOSIS — Z7901 Long term (current) use of anticoagulants: Secondary | ICD-10-CM

## 2014-11-09 DIAGNOSIS — Z5181 Encounter for therapeutic drug level monitoring: Secondary | ICD-10-CM

## 2014-11-09 DIAGNOSIS — I4891 Unspecified atrial fibrillation: Secondary | ICD-10-CM

## 2014-11-09 LAB — POCT INR: INR: 2.4

## 2014-11-10 ENCOUNTER — Other Ambulatory Visit: Payer: Self-pay | Admitting: Cardiology

## 2014-11-23 ENCOUNTER — Ambulatory Visit (INDEPENDENT_AMBULATORY_CARE_PROVIDER_SITE_OTHER): Payer: Medicare Other | Admitting: *Deleted

## 2014-11-23 DIAGNOSIS — I482 Chronic atrial fibrillation, unspecified: Secondary | ICD-10-CM

## 2014-11-23 DIAGNOSIS — Z5181 Encounter for therapeutic drug level monitoring: Secondary | ICD-10-CM

## 2014-11-23 DIAGNOSIS — I4891 Unspecified atrial fibrillation: Secondary | ICD-10-CM

## 2014-11-23 DIAGNOSIS — Z7901 Long term (current) use of anticoagulants: Secondary | ICD-10-CM

## 2014-11-23 LAB — POCT INR: INR: 1.6

## 2014-11-25 ENCOUNTER — Encounter: Payer: Self-pay | Admitting: *Deleted

## 2014-12-07 ENCOUNTER — Ambulatory Visit (INDEPENDENT_AMBULATORY_CARE_PROVIDER_SITE_OTHER): Payer: Medicare Other

## 2014-12-07 DIAGNOSIS — Z5181 Encounter for therapeutic drug level monitoring: Secondary | ICD-10-CM

## 2014-12-07 DIAGNOSIS — I4891 Unspecified atrial fibrillation: Secondary | ICD-10-CM

## 2014-12-07 DIAGNOSIS — Z7901 Long term (current) use of anticoagulants: Secondary | ICD-10-CM

## 2014-12-07 LAB — POCT INR: INR: 2.9

## 2014-12-11 ENCOUNTER — Other Ambulatory Visit: Payer: Self-pay | Admitting: Cardiology

## 2014-12-23 ENCOUNTER — Ambulatory Visit
Admission: RE | Admit: 2014-12-23 | Discharge: 2014-12-23 | Disposition: A | Discharge status: Home / Self Care | Payer: Medicare Other | Source: Ambulatory Visit | Attending: Internal Medicine | Admitting: Internal Medicine

## 2014-12-23 ENCOUNTER — Other Ambulatory Visit: Payer: Self-pay | Admitting: Internal Medicine

## 2014-12-23 DIAGNOSIS — T148XXA Other injury of unspecified body region, initial encounter: Secondary | ICD-10-CM

## 2014-12-28 ENCOUNTER — Ambulatory Visit (INDEPENDENT_AMBULATORY_CARE_PROVIDER_SITE_OTHER): Payer: Medicare Other | Admitting: Pharmacist

## 2014-12-28 DIAGNOSIS — Z7901 Long term (current) use of anticoagulants: Secondary | ICD-10-CM | POA: Diagnosis not present

## 2014-12-28 DIAGNOSIS — I4891 Unspecified atrial fibrillation: Secondary | ICD-10-CM | POA: Diagnosis not present

## 2014-12-28 DIAGNOSIS — Z5181 Encounter for therapeutic drug level monitoring: Secondary | ICD-10-CM | POA: Diagnosis not present

## 2014-12-28 LAB — POCT INR: INR: 2.7

## 2014-12-29 ENCOUNTER — Other Ambulatory Visit: Payer: Self-pay | Admitting: Cardiology

## 2015-01-19 ENCOUNTER — Other Ambulatory Visit: Payer: Self-pay

## 2015-01-19 DIAGNOSIS — Z1231 Encounter for screening mammogram for malignant neoplasm of breast: Secondary | ICD-10-CM

## 2015-01-25 ENCOUNTER — Ambulatory Visit (INDEPENDENT_AMBULATORY_CARE_PROVIDER_SITE_OTHER): Payer: Medicare Other | Admitting: *Deleted

## 2015-01-25 ENCOUNTER — Encounter: Payer: Self-pay | Admitting: Internal Medicine

## 2015-01-25 ENCOUNTER — Ambulatory Visit (INDEPENDENT_AMBULATORY_CARE_PROVIDER_SITE_OTHER): Payer: Medicare Other | Admitting: Internal Medicine

## 2015-01-25 VITALS — BP 110/58 | HR 64 | Ht 65.5 in | Wt 149.0 lb

## 2015-01-25 DIAGNOSIS — I495 Sick sinus syndrome: Secondary | ICD-10-CM

## 2015-01-25 DIAGNOSIS — Z7901 Long term (current) use of anticoagulants: Secondary | ICD-10-CM

## 2015-01-25 DIAGNOSIS — Z5181 Encounter for therapeutic drug level monitoring: Secondary | ICD-10-CM

## 2015-01-25 DIAGNOSIS — I4891 Unspecified atrial fibrillation: Secondary | ICD-10-CM

## 2015-01-25 LAB — MDC_IDC_ENUM_SESS_TYPE_INCLINIC
Brady Statistic RV Percent Paced: 22 %
Date Time Interrogation Session: 20160425113441
Implantable Pulse Generator Model: 5626
Implantable Pulse Generator Serial Number: 2036664
Lead Channel Impedance Value: 497 Ohm
Lead Channel Pacing Threshold Amplitude: 0.75 V
Lead Channel Pacing Threshold Pulse Width: 0.4 ms
Lead Channel Setting Sensing Sensitivity: 2 mV
MDC IDC MSMT BATTERY IMPEDANCE: 1000 Ohm — AB
MDC IDC MSMT BATTERY VOLTAGE: 2.8 V
MDC IDC MSMT LEADCHNL RV SENSING INTR AMPL: 12 mV — AB
MDC IDC SET LEADCHNL RV PACING AMPLITUDE: 2.5 V
MDC IDC SET LEADCHNL RV PACING PULSEWIDTH: 0.4 ms

## 2015-01-25 LAB — POCT INR: INR: 2.5

## 2015-01-25 NOTE — Patient Instructions (Signed)
Medication Instructions:  None ordered  Labwork: None ordered  Testing/Procedures: None ordered  Follow-Up: Your physician wants you to follow-up in: 12 months with Gypsy BalsamAmber Seiler, NP You will receive a reminder letter in the mail two months in advance. If you don't receive a letter, please call our office to schedule the follow-up appointment.   Any Other Special Instructions Will Be Listed Below (If Applicable).

## 2015-01-25 NOTE — Progress Notes (Signed)
PCP:  [name], [name] Primary Cardiologist:  [name]  CC: afib  The patient presents today for routine electrophysiology followup.  Since last being seen in our clinic, the patient reports doing very well.  Her husband died last year from a stroke.  She continues to grieve.  She remains active despite her age.  Today, she denies symptoms of palpitations, chest pain, shortness of breath, orthopnea, PND, lower extremity edema, dizziness, presyncope, syncope, or neurologic sequela.  The patient feels that she is tolerating medications without difficulties and is otherwise without complaint today.   Past Medical History  Diagnosis Date  . Hypertension     essential  . Diabetes mellitus   . CHF (congestive heart failure)     compensated  . Hyperlipidemia   . Permanent atrial fibrillation     on coumadin  . Tachycardia-bradycardia syndrome     with single chamber ventricular pacemaker  . Hypercholesteremia     with intolerance to statin therapy  . Aortic stenosis   . History of peptic ulcer disease   . Peripheral neuropathy   . Thrombocytopenia   . Anemia associated with acute blood loss 03/11/2007   . Gastrointestinal bleed 03/11/2007   . Duodenal ulcer 03/11/2007   . Thyroid disease    Past Surgical History  Procedure Laterality Date  . Shoulder surgery  12/07/2004  . Pacemaker insertion  06/18/2008    SJM Zephyr XL SR implanted by [name]  . Breast biopsy  02/22/2007     left  . Cardioversion  05/16/2005    Electrical cardioversion   . Esophagogastroduodenoscopy  03/12/2007     Esophagogastroduodenoscopy with control of bleeding    Current Outpatient Prescriptions  Medication Sig Dispense Refill  . amoxicillin (AMOXIL) 500 MG capsule Take as directed for dental procedures only    . calcium carbonate (OS-CAL) 600 MG TABS Take 1,200 mg by mouth daily.      Marland Kitchen CARTIA XT 240 MG 24 hr capsule TAKE ONE CAPSULE BY MOUTH ONCE DAILY 90 capsule 0  . digoxin (LANOXIN)  0.25 MG tablet TAKE ONE-HALF TABLET BY MOUTH ONCE DAILY OR AS DIRECTED 120 tablet 1  . furosemide (LASIX) 40 MG tablet TAKE ONE TABLET BY MOUTH EVERY OTHER DAY ALTERNATING WITH 2 TABLETS EVERY OTHER DAY 135 tablet 1  . gabapentin (NEURONTIN) 300 MG capsule Take 1 tablet by mouth in the am and 3 tablets by mouth in the pm    . HYDROcodone-homatropine (HYCODAN) 5-1.5 MG/5ML syrup Take 5 mLs by mouth every 6 (six) hours as needed for cough. 120 mL 0  . levothyroxine (SYNTHROID, LEVOTHROID) 50 MCG tablet Take 50 mcg by mouth daily.      Marland Kitchen lisinopril (PRINIVIL,ZESTRIL) 2.5 MG tablet Take 2.5 mg by mouth daily.    . metoprolol (LOPRESSOR) 100 MG tablet TAKE ONE TABLET BY MOUTH TWICE DAILY 180 tablet 1  . Multiple Vitamins-Minerals (VISION-VITE PRESERVE PO) Take 1 capsule by mouth 2 (two) times daily.    . simvastatin (ZOCOR) 20 MG tablet Take 10 mg by mouth daily.    Marland Kitchen warfarin (COUMADIN) 5 MG tablet Take 1/2-1 tablet by mouth daily 30 tablet 2  . metFORMIN (GLUCOPHAGE) 500 MG tablet Take 2 tablets by mouth 2 (two) times daily.     No current facility-administered medications for this visit.    Allergies  Allergen Reactions  . Aspirin Other (See Comments)    Gi bleeding  . Codeine     GI Lead  .  Lipitor [Atorvastatin Calcium]     Muscle Ache  . Pravachol     Muscle Ache    History   Social History  . Marital Status: Married    Spouse Name: N/A  . Number of Children: N/A  . Years of Education: N/A   Occupational History  . Not on file.   Social History Main Topics  . Smoking status: Former Smoker    Types: Cigarettes    Quit date: 03/06/1961  . Smokeless tobacco: Not on file  . Alcohol Use: No  . Drug Use: No  . Sexual Activity: Not on file   Other Topics Concern  . Not on file   Social History Narrative    Family History  Problem Relation Age of Onset  . Heart disease Father   . Emphysema Father   . Stroke Sister     cerebral hemorrhage  . Breast cancer Sister    . Heart disease Sister   . Heart failure Father   . Coronary artery disease Father   . Heart failure Sister     Physical Exam: Filed Vitals:   01/25/15 1055  BP: 110/58  Pulse: 64  Height: 5' 5.5" (1.664 m)  Weight: 149 lb (67.586 kg)    GEN- The patient is well appearing, alert and oriented x 3 today.   Head- normocephalic, atraumatic Eyes-  Sclera clear, conjunctiva pink Ears- hearing intact Oropharynx- clear Neck- supple, no JVP Lymph- no cervical lymphadenopathy Lungs- Clear to ausculation bilaterally, normal work of breathing Chest- pacemaker pocket is well healed Heart- irregular rate and rhythm, 2/6 SEM LUSB (early peaking), 2/6 SEM at the apex GI- soft, NT, ND, + BS Extremities- no clubbing, cyanosis, or edema  Pacemaker interrogation- reviewed in detail today,  See PACEART report  Assessment and Plan:  1. Permanent afib chads2vasc score is at least 5.  She should continue coumadin long term [name] notes are reviewed  2. Bradycardia Normal pacemaker function See Pace Art report No changes today  3. HTN Stable No change required today  Return to see EP NP in 1 year Janet Janet SermonsBrackbill to see as scheduled

## 2015-01-27 ENCOUNTER — Ambulatory Visit
Admission: RE | Admit: 2015-01-27 | Discharge: 2015-01-27 | Disposition: A | Discharge status: Home / Self Care | Payer: Medicare Other | Source: Ambulatory Visit

## 2015-01-27 DIAGNOSIS — Z1231 Encounter for screening mammogram for malignant neoplasm of breast: Secondary | ICD-10-CM

## 2015-02-03 ENCOUNTER — Ambulatory Visit
Admission: RE | Admit: 2015-02-03 | Discharge: 2015-02-03 | Disposition: A | Discharge status: Home / Self Care | Payer: Medicare Other | Source: Ambulatory Visit | Attending: Internal Medicine | Admitting: Internal Medicine

## 2015-02-03 ENCOUNTER — Other Ambulatory Visit: Payer: Self-pay | Admitting: Internal Medicine

## 2015-02-03 ENCOUNTER — Encounter: Payer: Self-pay | Admitting: Internal Medicine

## 2015-02-03 DIAGNOSIS — M25552 Pain in left hip: Secondary | ICD-10-CM

## 2015-02-09 ENCOUNTER — Other Ambulatory Visit: Payer: Self-pay | Admitting: Cardiology

## 2015-02-11 ENCOUNTER — Encounter: Payer: Self-pay | Admitting: Cardiology

## 2015-02-11 ENCOUNTER — Ambulatory Visit (INDEPENDENT_AMBULATORY_CARE_PROVIDER_SITE_OTHER): Payer: Medicare Other | Admitting: Cardiology

## 2015-02-11 VITALS — BP 118/80 | HR 65 | Ht 65.0 in | Wt 150.8 lb

## 2015-02-11 DIAGNOSIS — I482 Chronic atrial fibrillation, unspecified: Secondary | ICD-10-CM

## 2015-02-11 DIAGNOSIS — I119 Hypertensive heart disease without heart failure: Secondary | ICD-10-CM | POA: Diagnosis not present

## 2015-02-11 DIAGNOSIS — I350 Nonrheumatic aortic (valve) stenosis: Secondary | ICD-10-CM | POA: Diagnosis not present

## 2015-02-11 DIAGNOSIS — R0989 Other specified symptoms and signs involving the circulatory and respiratory systems: Secondary | ICD-10-CM

## 2015-02-11 NOTE — Progress Notes (Signed)
Cardiology Office Note   Date:  02/11/2015   ID:  Janet GirtMildred H Benitez, DOB 02/13/1927, MRN [id number]  PCP:  Lorenda PeckOBERTS, Janet WAYNE, MD  Cardiologist: Cassell Clementhomas Janet Lizardi MD  No chief complaint on file.     History of Present Illness: [name] is a 81 y.o. female who presents for a four-month follow-up office visit This pleasant 81 year old woman is seen for a scheduled followup office visit. She has a past history of established atrial fibrillation. She has a history of aortic stenosis which on echocardiogram on 12/29/10 was stable and not severe. She does not have any history of ischemic heart disease. She had a normal nuclear stress test 01/21/07. She has a history of tachybradycardia syndrome and has a functioning pacemaker. Since last visit she has been doing well. No new cardiac symptoms. She has a history of diabetes mellitus and is followed by Dr. Su Hiltoberts. She has diabetic neuropathy and goes to [facility] once a year to see Dr. Conrad BurlingtonLefkowitz. Her lipids and blood work are followed by Dr. Su Hiltoberts.  The patient has a known soft left carotid bruit. She had carotid duplex ultrasound on 06/13/13 which showed less than 40% stenosis bilaterally. The patient is a recent widow. Her husband died at the end of November 2015 following a lengthy illness. Overall she appears to be handling the grief process okay. The patient is not having any chest pain or shortness of breath.  She has occasional mild dizziness.  No syncope.  She has easy bruising from her warfarin. She has developed painful left hip.  She has been told of probable osteoarthritis.  She may need to see an orthopedist.  Dr. Charlann Benitez took care of her husband.  Past Medical History  Diagnosis Date  . Hypertension     essential  . Diabetes mellitus   . CHF (congestive heart failure)     compensated  . Hyperlipidemia   . Permanent atrial fibrillation     on coumadin  . Tachycardia-bradycardia syndrome     with single chamber  ventricular pacemaker  . Hypercholesteremia     with intolerance to statin therapy  . Aortic stenosis   . History of peptic ulcer disease   . Peripheral neuropathy   . Thrombocytopenia   . Anemia associated with acute blood loss 03/11/2007   . Gastrointestinal bleed 03/11/2007   . Duodenal ulcer 03/11/2007   . Thyroid disease     Past Surgical History  Procedure Laterality Date  . Shoulder surgery  12/07/2004  . Pacemaker insertion  06/18/2008    SJM Zephyr XL SR implanted by Dr Reyes IvanKersey  . Breast biopsy  02/22/2007     left  . Cardioversion  05/16/2005    Electrical cardioversion   . Esophagogastroduodenoscopy  03/12/2007     Esophagogastroduodenoscopy with control of bleeding     Current Outpatient Prescriptions  Medication Sig Dispense Refill  . amoxicillin (AMOXIL) 500 MG capsule Take as directed for dental procedures only    . calcium carbonate (OS-CAL) 600 MG TABS Take 1,200 mg by mouth daily.      Marland Kitchen. CARTIA XT 240 MG 24 hr capsule TAKE ONE CAPSULE BY MOUTH ONCE DAILY 90 capsule 0  . digoxin (LANOXIN) 0.25 MG tablet TAKE ONE-HALF TABLET BY MOUTH ONCE DAILY OR AS DIRECTED 120 tablet 1  . furosemide (LASIX) 40 MG tablet TAKE ONE TABLET BY MOUTH EVERY OTHER DAY ALTERNATING WITH 2 TABLETS EVERY OTHER DAY 135 tablet 1  . gabapentin (NEURONTIN) 300 MG  capsule Take 1 tablet by mouth in the am and 3 tablets by mouth in the pm    . HYDROcodone-acetaminophen (NORCO/VICODIN) 5-325 MG per tablet Take 1 tablet by mouth 3 (three) times daily as needed (for pain).     Marland Kitchen levothyroxine (SYNTHROID, LEVOTHROID) 50 MCG tablet Take 50 mcg by mouth daily.      Marland Kitchen lisinopril (PRINIVIL,ZESTRIL) 2.5 MG tablet Take 2.5 mg by mouth daily.    . metFORMIN (GLUCOPHAGE) 500 MG tablet Take 2 tablets by mouth 2 (two) times daily.    . metoprolol (LOPRESSOR) 100 MG tablet TAKE ONE TABLET BY MOUTH TWICE DAILY 180 tablet 0  . Multiple Vitamins-Minerals (VISION-VITE PRESERVE PO) Take 1 capsule by mouth 2 (two)  times daily.    . simvastatin (ZOCOR) 20 MG tablet Take 10 mg by mouth daily.    Marland Kitchen warfarin (COUMADIN) 5 MG tablet Take 1/2-1 tablet by mouth daily 30 tablet 2   No current facility-administered medications for this visit.    Allergies:   Aspirin; Codeine; Lipitor; and Pravachol    Social History:  The patient  reports that she quit smoking about 53 years ago. Her smoking use included Cigarettes. She does not have any smokeless tobacco history on file. She reports that she does not drink alcohol or use illicit drugs.   Family History:  The patient's family history includes Breast cancer in her sister; Coronary artery disease in her father; Emphysema in her father; Heart disease in her father and sister; Heart failure in her father and sister; Stroke in her sister.    ROS:  Please see the history of present illness.   Otherwise, review of systems are positive for none.   All other systems are reviewed and negative.    PHYSICAL EXAM: VS:  BP 118/80 mmHg  Pulse 65  Ht  (1.651 m)  Wt 150 lb 12.8 oz (68.402 kg)  BMI 25.09 kg/m2 , BMI Body mass index is 25.09 kg/(m^2). GEN: Well nourished, well developed, in no acute distress HEENT: normal Neck: There are bilateral soft carotid bruits. Cardiac: RRR; there is a soft systolic ejection murmur at the aortic area.  No rubs, or gallops, and there is mild edema of left lower leg.. The rhythm is regular and paced underlying atrial flutter  Respiratory:  clear to auscultation bilaterally, normal work of breathing GI: soft, nontender, nondistended, + BS MS: no deformity or atrophy Skin: warm and dry, no rash Neuro:  Strength and sensation are intact Psych: euthymic mood, full affect   EKG:  EKG is ordered today. The ekg ordered today demonstrates ventricular paced rhythm with underlying atrial fibrillation   Recent Labs: No results found for requested labs within last 365 days.    Lipid Panel No results found for: CHOL, TRIG, HDL,  CHOLHDL, VLDL, LDLCALC, LDLDIRECT    Wt Readings from Last 3 Encounters:  02/11/15 150 lb 12.8 oz (68.402 kg)  01/25/15 149 lb (67.586 kg)  10/07/14 145 lb (65.772 kg)         ASSESSMENT AND PLAN:  1. permanent atrial fibrillation 2. essential hypertension without heart failure 3. tachybradycardia syndrome with functioning Pacemaker 4. chronic Coumadin anticoagulation 5. mild to moderate aortic stenosis 6. diabetes mellitus followed by Dr. Su Hilt. Diabetic neuropathy followed by Dr. Conrad [place] at [facility] 7. dyslipidemia followed by Dr. Su Hilt 8. soft left carotid bruit with carotid duplex ultrasound on 06/13/13 showing less than 40% stenosis bilaterally 9. prolonged stress with her husband's illness. He finally expired  after a lengthy illness at the end of November 2015 10.  Pain in left hip probably secondary to osteoarthritis.  She anticipates that Dr. Su Hiltoberts will be sending her to an orthopedist.  Disposition: Continue same medication. Recheck in 4 months for office visit.   Current medicines are reviewed at length with the patient today.  The patient does not have concerns regarding medicines.  The following changes have been made:  no change  Labs/ tests ordered today include:   Orders Placed This Encounter  Procedures  . EKG 12-Lead       Signed, Cassell Clementhomas Jerrad Mendibles MD 02/11/2015 1:21 PM    [facility] [address], [place], [place]  [address] Phone: [phone]; Fax: [phone]

## 2015-02-11 NOTE — Patient Instructions (Signed)
Medication Instructions:  Your physician recommends that you continue on your current medications as directed. Please refer to the Current Medication list given to you today.  Labwork: NONE  Testing/Procedures: NONE  Follow-Up: Your physician recommends that you schedule a follow-up appointment in: 4 MONTH OV  Any Other Special Instructions Will Be Listed Below (If Applicable).  

## 2015-02-22 ENCOUNTER — Ambulatory Visit (INDEPENDENT_AMBULATORY_CARE_PROVIDER_SITE_OTHER): Payer: Medicare Other | Admitting: *Deleted

## 2015-02-22 DIAGNOSIS — I482 Chronic atrial fibrillation, unspecified: Secondary | ICD-10-CM

## 2015-02-22 DIAGNOSIS — Z5181 Encounter for therapeutic drug level monitoring: Secondary | ICD-10-CM | POA: Diagnosis not present

## 2015-02-22 DIAGNOSIS — Z7901 Long term (current) use of anticoagulants: Secondary | ICD-10-CM | POA: Diagnosis not present

## 2015-02-22 DIAGNOSIS — I4891 Unspecified atrial fibrillation: Secondary | ICD-10-CM

## 2015-02-22 LAB — POCT INR: INR: 2.9

## 2015-02-26 ENCOUNTER — Ambulatory Visit (INDEPENDENT_AMBULATORY_CARE_PROVIDER_SITE_OTHER): Payer: Medicare Other

## 2015-02-26 DIAGNOSIS — Z7901 Long term (current) use of anticoagulants: Secondary | ICD-10-CM

## 2015-02-26 DIAGNOSIS — I482 Chronic atrial fibrillation, unspecified: Secondary | ICD-10-CM

## 2015-02-26 DIAGNOSIS — Z5181 Encounter for therapeutic drug level monitoring: Secondary | ICD-10-CM | POA: Diagnosis not present

## 2015-02-26 DIAGNOSIS — I4891 Unspecified atrial fibrillation: Secondary | ICD-10-CM

## 2015-02-26 LAB — POCT INR: INR: 2.6

## 2015-03-09 ENCOUNTER — Other Ambulatory Visit: Payer: Self-pay | Admitting: Cardiology

## 2015-03-22 ENCOUNTER — Ambulatory Visit (INDEPENDENT_AMBULATORY_CARE_PROVIDER_SITE_OTHER): Payer: Medicare Other | Admitting: *Deleted

## 2015-03-22 DIAGNOSIS — I482 Chronic atrial fibrillation, unspecified: Secondary | ICD-10-CM

## 2015-03-22 DIAGNOSIS — Z5181 Encounter for therapeutic drug level monitoring: Secondary | ICD-10-CM | POA: Diagnosis not present

## 2015-03-22 DIAGNOSIS — Z7901 Long term (current) use of anticoagulants: Secondary | ICD-10-CM

## 2015-03-22 DIAGNOSIS — I4891 Unspecified atrial fibrillation: Secondary | ICD-10-CM | POA: Diagnosis not present

## 2015-03-22 LAB — POCT INR: INR: 3.5

## 2015-04-12 ENCOUNTER — Ambulatory Visit (INDEPENDENT_AMBULATORY_CARE_PROVIDER_SITE_OTHER): Payer: Medicare Other | Admitting: *Deleted

## 2015-04-12 DIAGNOSIS — Z7901 Long term (current) use of anticoagulants: Secondary | ICD-10-CM

## 2015-04-12 DIAGNOSIS — I4891 Unspecified atrial fibrillation: Secondary | ICD-10-CM

## 2015-04-12 DIAGNOSIS — I482 Chronic atrial fibrillation, unspecified: Secondary | ICD-10-CM

## 2015-04-12 DIAGNOSIS — Z5181 Encounter for therapeutic drug level monitoring: Secondary | ICD-10-CM | POA: Diagnosis not present

## 2015-04-12 LAB — POCT INR: INR: 3.2

## 2015-04-26 ENCOUNTER — Ambulatory Visit (INDEPENDENT_AMBULATORY_CARE_PROVIDER_SITE_OTHER): Payer: Medicare Other | Admitting: *Deleted

## 2015-04-26 DIAGNOSIS — Z7901 Long term (current) use of anticoagulants: Secondary | ICD-10-CM

## 2015-04-26 DIAGNOSIS — Z5181 Encounter for therapeutic drug level monitoring: Secondary | ICD-10-CM | POA: Diagnosis not present

## 2015-04-26 DIAGNOSIS — I482 Chronic atrial fibrillation, unspecified: Secondary | ICD-10-CM

## 2015-04-26 DIAGNOSIS — I4891 Unspecified atrial fibrillation: Secondary | ICD-10-CM

## 2015-04-26 LAB — POCT INR: INR: 2.5

## 2015-05-10 ENCOUNTER — Other Ambulatory Visit: Payer: Self-pay | Admitting: Cardiology

## 2015-05-17 ENCOUNTER — Ambulatory Visit (INDEPENDENT_AMBULATORY_CARE_PROVIDER_SITE_OTHER): Payer: Medicare Other | Admitting: *Deleted

## 2015-05-17 DIAGNOSIS — Z7901 Long term (current) use of anticoagulants: Secondary | ICD-10-CM

## 2015-05-17 DIAGNOSIS — I482 Chronic atrial fibrillation, unspecified: Secondary | ICD-10-CM

## 2015-05-17 DIAGNOSIS — I4891 Unspecified atrial fibrillation: Secondary | ICD-10-CM | POA: Diagnosis not present

## 2015-05-17 DIAGNOSIS — Z5181 Encounter for therapeutic drug level monitoring: Secondary | ICD-10-CM

## 2015-05-17 LAB — POCT INR: INR: 3.6

## 2015-05-31 ENCOUNTER — Ambulatory Visit (INDEPENDENT_AMBULATORY_CARE_PROVIDER_SITE_OTHER): Payer: Medicare Other | Admitting: Pharmacist

## 2015-05-31 DIAGNOSIS — Z7901 Long term (current) use of anticoagulants: Secondary | ICD-10-CM

## 2015-05-31 DIAGNOSIS — I4891 Unspecified atrial fibrillation: Secondary | ICD-10-CM

## 2015-05-31 DIAGNOSIS — I482 Chronic atrial fibrillation, unspecified: Secondary | ICD-10-CM

## 2015-05-31 DIAGNOSIS — Z5181 Encounter for therapeutic drug level monitoring: Secondary | ICD-10-CM

## 2015-05-31 LAB — POCT INR: INR: 2.6

## 2015-06-11 ENCOUNTER — Other Ambulatory Visit: Payer: Self-pay | Admitting: Cardiology

## 2015-06-14 ENCOUNTER — Ambulatory Visit (INDEPENDENT_AMBULATORY_CARE_PROVIDER_SITE_OTHER): Payer: Medicare Other | Admitting: Cardiology

## 2015-06-14 ENCOUNTER — Encounter: Payer: Self-pay | Admitting: Cardiology

## 2015-06-14 ENCOUNTER — Ambulatory Visit (INDEPENDENT_AMBULATORY_CARE_PROVIDER_SITE_OTHER): Payer: Medicare Other | Admitting: *Deleted

## 2015-06-14 VITALS — BP 126/58 | HR 59 | Ht 66.0 in | Wt 149.0 lb

## 2015-06-14 DIAGNOSIS — I119 Hypertensive heart disease without heart failure: Secondary | ICD-10-CM

## 2015-06-14 DIAGNOSIS — I481 Persistent atrial fibrillation: Secondary | ICD-10-CM

## 2015-06-14 DIAGNOSIS — Z7901 Long term (current) use of anticoagulants: Secondary | ICD-10-CM | POA: Diagnosis not present

## 2015-06-14 DIAGNOSIS — I482 Chronic atrial fibrillation, unspecified: Secondary | ICD-10-CM

## 2015-06-14 DIAGNOSIS — I350 Nonrheumatic aortic (valve) stenosis: Secondary | ICD-10-CM

## 2015-06-14 DIAGNOSIS — I4819 Other persistent atrial fibrillation: Secondary | ICD-10-CM

## 2015-06-14 DIAGNOSIS — Z5181 Encounter for therapeutic drug level monitoring: Secondary | ICD-10-CM

## 2015-06-14 DIAGNOSIS — I4891 Unspecified atrial fibrillation: Secondary | ICD-10-CM

## 2015-06-14 LAB — POCT INR: INR: 2.9

## 2015-06-14 NOTE — Patient Instructions (Signed)
Medication Instructions:  Your physician recommends that you continue on your current medications as directed. Please refer to the Current Medication list given to you today.  Labwork: none  Testing/Procedures: none  Follow-Up: Your physician wants you to follow-up in: 6 month ov with Dr Shelda Pal will receive a reminder letter in the mail two months in advance. If you don't receive a letter, please call our office to schedule the follow-up appointment.

## 2015-06-14 NOTE — Progress Notes (Signed)
Cardiology Office Note   Date:  06/14/2015   ID:  [name], DOB [birth year], MRN [id number]  PCP:  Janet Peck, Benitez  Cardiologist: [name]  No chief complaint on file.     History of Present Illness: [name] is a 81 y.o. female who presents for follow-up office visit   This pleasant 81 year old woman is seen for a scheduled followup office visit. She has a past history of established atrial fibrillation. She has a history of aortic stenosis which on echocardiogram on 12/29/10 was stable and not severe. She does not have any history of ischemic heart disease. She had a normal nuclear stress test 01/21/07. She has a history of tachybradycardia syndrome and has a functioning pacemaker. Since last visit she has been doing well. No new cardiac symptoms. She has a history of diabetes mellitus and is followed by Dr. Su Benitez. She has diabetic neuropathy and goes to [facility] once a year to see Dr. Conrad Benitez. Her lipids and blood work are followed by Dr. Su Benitez.  The patient has a known soft left carotid bruit. She had carotid duplex ultrasound on 06/13/13 which showed less than 40% stenosis bilaterally. The patient is a recent widow. Her husband died at the end of December 05, 2015following a lengthy illness. Overall she appears to be handling the grief process okay.  Recently her sister [name] died The patient is not having any chest pain or shortness of breath. She has occasional mild dizziness. No syncope. She has easy bruising from her warfarin. She has arthritis of the left hip.  This has been stable since her last visit. She has not been experiencing any new cardiac symptoms.  No chest pain.  No racing of her heart.  No increased shortness of breath.   Past Medical History  Diagnosis Date  . Hypertension     essential  . Diabetes mellitus   . CHF (congestive heart failure)     compensated  . Hyperlipidemia   . Permanent atrial  fibrillation     on coumadin  . Tachycardia-bradycardia syndrome     with single chamber ventricular pacemaker  . Hypercholesteremia     with intolerance to statin therapy  . Aortic stenosis   . History of peptic ulcer disease   . Peripheral neuropathy   . Thrombocytopenia   . Anemia associated with acute blood loss 03/11/2007   . Gastrointestinal bleed 03/11/2007   . Duodenal ulcer 03/11/2007   . Thyroid disease     Past Surgical History  Procedure Laterality Date  . Shoulder surgery  12/07/2004  . Pacemaker insertion  06/18/2008    SJM Zephyr XL SR implanted by Dr [name]  . Breast biopsy  02/22/2007     left  . Cardioversion  05/16/2005    Electrical cardioversion   . Esophagogastroduodenoscopy  03/12/2007     Esophagogastroduodenoscopy with control of bleeding     Current Outpatient Prescriptions  Medication Sig Dispense Refill  . amoxicillin (AMOXIL) 500 MG capsule Take as directed for dental procedures only    . calcium carbonate (OS-CAL) 600 MG TABS Take 1,200 mg by mouth daily.      Marland Kitchen CARTIA XT 240 MG 24 hr capsule TAKE ONE CAPSULE BY MOUTH ONCE DAILY 90 capsule 0  . digoxin (LANOXIN) 0.25 MG tablet TAKE ONE-HALF TABLET BY MOUTH ONCE DAILY OR AS DIRECTED 120 tablet 1  . furosemide (LASIX) 40 MG tablet TAKE ONE TABLET BY MOUTH EVERY OTHER DAY  ALTERNATING WITH 2 TABLETS EVERY OTHER DAY 135 tablet 1  . gabapentin (NEURONTIN) 300 MG capsule Take 1 tablet by mouth in the am and 3 tablets by mouth in the pm    . HYDROcodone-acetaminophen (NORCO/VICODIN) 5-325 MG per tablet Take 1 tablet by mouth 3 (three) times daily as needed (for pain).     Marland Kitchen levothyroxine (SYNTHROID, LEVOTHROID) 50 MCG tablet Take 50 mcg by mouth daily.      Marland Kitchen lisinopril (PRINIVIL,ZESTRIL) 2.5 MG tablet Take 2.5 mg by mouth daily.    . metFORMIN (GLUCOPHAGE) 500 MG tablet Take 2 tablets by mouth 2 (two) times daily.    . metoprolol (LOPRESSOR) 100 MG tablet TAKE ONE TABLET BY MOUTH TWICE DAILY 180 tablet  0  . Multiple Vitamins-Minerals (VISION-VITE PRESERVE PO) Take 1 capsule by mouth 2 (two) times daily.    . simvastatin (ZOCOR) 20 MG tablet Take 10 mg by mouth daily.    Marland Kitchen warfarin (COUMADIN) 5 MG tablet TAKE ONE-HALF TO ONE TABLET BY MOUTH ONCE DAILY 30 tablet 3   No current facility-administered medications for this visit.    Allergies:   Aspirin; Codeine; Lipitor; and Pravachol    Social History:  The patient  reports that she quit smoking about 54 years ago. Her smoking use included Cigarettes. She does not have any smokeless tobacco history on file. She reports that she does not drink alcohol or use illicit drugs.   Family History:  The patient's family history includes Breast cancer in her sister; Coronary artery disease in her father; Emphysema in her father; Heart disease in her father and sister; Heart failure in her father and sister; Stroke in her sister.    ROS:  Please see the history of present illness.   Otherwise, review of systems are positive for none.   All other systems are reviewed and negative.    PHYSICAL EXAM: VS:  BP 126/58 mmHg  Pulse 59  Ht  (1.676 m)  Wt 149 lb (67.586 kg)  BMI 24.06 kg/m2 , BMI Body mass index is 24.06 kg/(m^2). GEN: Well nourished, well developed, in no acute distress HEENT: normal Neck: no JVD, there are bilateral carotid bruits louder on the left than on the right. Cardiac: RRR;  Gr 2/ 6 systolic ejection murmur at the base.  Ventricular paced rhythm Respiratory:  clear to auscultation bilaterally, normal work of breathing GI: soft, nontender, nondistended, + BS MS: no deformity or atrophy Skin: warm and dry, no rash Neuro:  Strength and sensation are intact Psych: euthymic mood, full affect   EKG:  EKG is not ordered today.    Recent Labs: No results found for requested labs within last 365 days.    Lipid Panel No results found for: CHOL, TRIG, HDL, CHOLHDL, VLDL, LDLCALC, LDLDIRECT    Wt Readings from Last 3  Encounters:  06/14/15 149 lb (67.586 kg)  02/11/15 150 lb 12.8 oz (68.402 kg)  01/25/15 149 lb (67.586 kg)         ASSESSMENT AND PLAN:  1. permanent atrial fibrillation 2. essential hypertension without heart failure 3. tachybradycardia syndrome with functioning Pacemaker 4. chronic Coumadin anticoagulation 5. mild to moderate aortic stenosis 6. diabetes mellitus followed by Dr. Su Benitez. Diabetic neuropathy followed by Dr. Conrad [facility] at [facility] 7. dyslipidemia followed by Dr. Su Benitez 8. soft left carotid bruit with carotid duplex ultrasound on 06/13/13 showing less than 40% stenosis bilaterally 9. prolonged stress with her husband's illness. He finally expired after a lengthy illness at  the end of November 2015 10. Pain in left hip probably secondary to osteoarthritis. She anticipates that Dr. Su Benitez will be sending her to an orthopedist.  Disposition: Continue same medication. Recheck in 4 months for office visit.   Current medicines are reviewed at length with the patient today.  The patient does not have concerns regarding medicines.  The following changes have been made:  no change  Labs/ tests ordered today include:  No orders of the defined types were placed in this encounter.    Disposition: I told her about my upcoming retirement.  She will return in 6 months to see Dr. Jens Som whom she has requested and whom has seen her deceased husband.  Karie Schwalbe Benitez 06/14/2015 2:50 PM    [facility] [address] [place], [place], [place]  [address] Phone: [phone]; Fax: [phone]

## 2015-06-25 ENCOUNTER — Other Ambulatory Visit: Payer: Self-pay | Admitting: Cardiology

## 2015-07-05 ENCOUNTER — Ambulatory Visit (INDEPENDENT_AMBULATORY_CARE_PROVIDER_SITE_OTHER): Payer: Medicare Other | Admitting: *Deleted

## 2015-07-05 DIAGNOSIS — Z5181 Encounter for therapeutic drug level monitoring: Secondary | ICD-10-CM | POA: Diagnosis not present

## 2015-07-05 DIAGNOSIS — I481 Persistent atrial fibrillation: Secondary | ICD-10-CM

## 2015-07-05 DIAGNOSIS — I4891 Unspecified atrial fibrillation: Secondary | ICD-10-CM

## 2015-07-05 DIAGNOSIS — Z7901 Long term (current) use of anticoagulants: Secondary | ICD-10-CM

## 2015-07-05 DIAGNOSIS — I4819 Other persistent atrial fibrillation: Secondary | ICD-10-CM

## 2015-07-05 LAB — POCT INR: INR: 2.6

## 2015-07-15 ENCOUNTER — Other Ambulatory Visit: Payer: Self-pay | Admitting: Cardiology

## 2015-08-02 ENCOUNTER — Ambulatory Visit (INDEPENDENT_AMBULATORY_CARE_PROVIDER_SITE_OTHER): Payer: Medicare Other | Admitting: Pharmacist

## 2015-08-02 DIAGNOSIS — I481 Persistent atrial fibrillation: Secondary | ICD-10-CM | POA: Diagnosis not present

## 2015-08-02 DIAGNOSIS — Z5181 Encounter for therapeutic drug level monitoring: Secondary | ICD-10-CM | POA: Diagnosis not present

## 2015-08-02 DIAGNOSIS — Z7901 Long term (current) use of anticoagulants: Secondary | ICD-10-CM

## 2015-08-02 DIAGNOSIS — I4891 Unspecified atrial fibrillation: Secondary | ICD-10-CM

## 2015-08-02 DIAGNOSIS — I4819 Other persistent atrial fibrillation: Secondary | ICD-10-CM

## 2015-08-02 LAB — POCT INR: INR: 2.8

## 2015-08-11 ENCOUNTER — Other Ambulatory Visit: Payer: Self-pay | Admitting: Cardiology

## 2015-09-13 ENCOUNTER — Ambulatory Visit (INDEPENDENT_AMBULATORY_CARE_PROVIDER_SITE_OTHER): Payer: Medicare Other | Admitting: *Deleted

## 2015-09-13 DIAGNOSIS — Z5181 Encounter for therapeutic drug level monitoring: Secondary | ICD-10-CM

## 2015-09-13 DIAGNOSIS — Z7901 Long term (current) use of anticoagulants: Secondary | ICD-10-CM | POA: Diagnosis not present

## 2015-09-13 DIAGNOSIS — I4891 Unspecified atrial fibrillation: Secondary | ICD-10-CM | POA: Diagnosis not present

## 2015-09-13 DIAGNOSIS — I481 Persistent atrial fibrillation: Secondary | ICD-10-CM | POA: Diagnosis not present

## 2015-09-13 DIAGNOSIS — I4819 Other persistent atrial fibrillation: Secondary | ICD-10-CM

## 2015-09-13 LAB — POCT INR: INR: 3.3

## 2015-09-14 ENCOUNTER — Other Ambulatory Visit: Payer: Self-pay | Admitting: Cardiology

## 2015-10-25 ENCOUNTER — Ambulatory Visit (INDEPENDENT_AMBULATORY_CARE_PROVIDER_SITE_OTHER): Payer: Medicare Other | Admitting: *Deleted

## 2015-10-25 DIAGNOSIS — I4819 Other persistent atrial fibrillation: Secondary | ICD-10-CM

## 2015-10-25 DIAGNOSIS — Z5181 Encounter for therapeutic drug level monitoring: Secondary | ICD-10-CM | POA: Diagnosis not present

## 2015-10-25 DIAGNOSIS — Z7901 Long term (current) use of anticoagulants: Secondary | ICD-10-CM | POA: Diagnosis not present

## 2015-10-25 DIAGNOSIS — I481 Persistent atrial fibrillation: Secondary | ICD-10-CM | POA: Diagnosis not present

## 2015-10-25 DIAGNOSIS — I4891 Unspecified atrial fibrillation: Secondary | ICD-10-CM | POA: Diagnosis not present

## 2015-10-25 LAB — POCT INR: INR: 2.8

## 2015-12-06 ENCOUNTER — Ambulatory Visit (INDEPENDENT_AMBULATORY_CARE_PROVIDER_SITE_OTHER): Payer: Medicare Other | Admitting: *Deleted

## 2015-12-06 DIAGNOSIS — Z5181 Encounter for therapeutic drug level monitoring: Secondary | ICD-10-CM

## 2015-12-06 DIAGNOSIS — I4891 Unspecified atrial fibrillation: Secondary | ICD-10-CM | POA: Diagnosis not present

## 2015-12-06 DIAGNOSIS — I481 Persistent atrial fibrillation: Secondary | ICD-10-CM

## 2015-12-06 DIAGNOSIS — I4819 Other persistent atrial fibrillation: Secondary | ICD-10-CM

## 2015-12-06 DIAGNOSIS — Z7901 Long term (current) use of anticoagulants: Secondary | ICD-10-CM

## 2015-12-06 LAB — POCT INR: INR: 2.5

## 2015-12-16 NOTE — Progress Notes (Signed)
HPI: FU atrial fibrillation; previously followed by Dr Patty SermonsBrackbill. Nuclear study 2008 normal. Echocardiogram August 2013 showed normal LV systolic function, mild to moderate aortic stenosis with mean gradient 20 mmHg and mild to moderate aortic insufficiency. Mild biatrial enlargement and mild mitral regurgitation. Carotid Dopplers September 2015 showed 1-39% bilateral stenosis and follow-up recommended 2 years. Patient also has history of pacemaker. Since last seen, There is no dyspnea, chest pain, palpitations, syncope or bleeding.  Current Outpatient Prescriptions  Medication Sig Dispense Refill  . amoxicillin (AMOXIL) 500 MG capsule Take as directed for dental procedures only    . calcium carbonate (OS-CAL) 600 MG TABS Take 1,200 mg by mouth daily.      Marland Kitchen. CARTIA XT 240 MG 24 hr capsule TAKE ONE CAPSULE BY MOUTH ONCE DAILY 90 capsule 2  . digoxin (LANOXIN) 0.25 MG tablet TAKE ONE-HALF TABLET BY MOUTH ONCE DAILY OR AS DIRECTED 120 tablet 1  . furosemide (LASIX) 40 MG tablet TAKE ONE TABLET BY MOUTH EVERY OTHER DAY ALTERNATING WITH 2 TABLETS EVERY OTHER DAY 135 tablet 1  . gabapentin (NEURONTIN) 300 MG capsule Take 1 tablet by mouth in the am and 3 tablets by mouth in the pm    . HYDROcodone-acetaminophen (NORCO/VICODIN) 5-325 MG per tablet Take 1 tablet by mouth 3 (three) times daily as needed (for pain).     Marland Kitchen. levothyroxine (SYNTHROID, LEVOTHROID) 50 MCG tablet Take 50 mcg by mouth daily.      Marland Kitchen. lisinopril (PRINIVIL,ZESTRIL) 2.5 MG tablet Take 2.5 mg by mouth daily.    . metFORMIN (GLUCOPHAGE) 500 MG tablet Take 2 tablets by mouth 2 (two) times daily.    . metoprolol (LOPRESSOR) 100 MG tablet TAKE ONE TABLET BY MOUTH TWICE DAILY 180 tablet 2  . Multiple Vitamins-Minerals (VISION-VITE PRESERVE PO) Take 1 capsule by mouth 2 (two) times daily.    . psyllium (METAMUCIL) 58.6 % powder Take 1 packet by mouth daily.    . simvastatin (ZOCOR) 20 MG tablet TAKE ONE-HALF TABLET BY MOUTH ONCE DAILY  30 tablet 2  . warfarin (COUMADIN) 5 MG tablet TAKE ONE-HALF TO ONE TABLET BY MOUTH ONCE DAILY 30 tablet 3   No current facility-administered medications for this visit.     Past Medical History  Diagnosis Date  . Hypertension     essential  . Diabetes mellitus   . CHF (congestive heart failure) (HCC)     compensated  . Hyperlipidemia   . Permanent atrial fibrillation (HCC)     on coumadin  . Tachycardia-bradycardia syndrome (HCC)     with single chamber ventricular pacemaker  . Hypercholesteremia     with intolerance to statin therapy  . Aortic stenosis   . History of peptic ulcer disease   . Peripheral neuropathy (HCC)   . Thrombocytopenia (HCC)   . Anemia associated with acute blood loss 03/11/2007   . Gastrointestinal bleed 03/11/2007   . Duodenal ulcer 03/11/2007   . Thyroid disease     Past Surgical History  Procedure Laterality Date  . Shoulder surgery  12/07/2004  . Pacemaker insertion  06/18/2008    SJM Zephyr XL SR implanted by Dr Reyes IvanKersey  . Breast biopsy  02/22/2007     left  . Cardioversion  05/16/2005    Electrical cardioversion   . Esophagogastroduodenoscopy  03/12/2007     Esophagogastroduodenoscopy with control of bleeding    Social History   Social History  . Marital Status: Married    Spouse Name: N/A  .  Number of Children: N/A  . Years of Education: N/A   Occupational History  . Not on file.   Social History Main Topics  . Smoking status: Former Smoker    Types: Cigarettes    Quit date: 03/06/1961  . Smokeless tobacco: Not on file  . Alcohol Use: No  . Drug Use: No  . Sexual Activity: Not on file   Other Topics Concern  . Not on file   Social History Narrative    Family History  Problem Relation Age of Onset  . Heart disease Father   . Emphysema Father   . Stroke Sister     cerebral hemorrhage  . Breast cancer Sister   . Heart disease Sister   . Heart failure Father   . Coronary artery disease Father   . Heart failure  Sister     ROS: no fevers or chills, productive cough, hemoptysis, dysphasia, odynophagia, melena, hematochezia, dysuria, hematuria, rash, seizure activity, orthopnea, PND, pedal edema, claudication. Remaining systems are negative.  Physical Exam: Well-developed well-nourished in no acute distress.  Skin is warm and dry.  HEENT is normal.  Neck is supple.  Chest is clear to auscultation with normal expansion.  Cardiovascular exam is irregular, 3/6 systolic murmur LSB Abdominal exam nontender or distended. No masses palpated. Extremities show trace edema. neuro grossly intact  ECG Atrial fibrillation at a rate of 83. Left axis deviation. Nonspecific ST changes.

## 2015-12-21 ENCOUNTER — Encounter: Payer: Self-pay | Admitting: Cardiology

## 2015-12-21 ENCOUNTER — Ambulatory Visit (INDEPENDENT_AMBULATORY_CARE_PROVIDER_SITE_OTHER): Payer: Medicare Other | Admitting: Cardiology

## 2015-12-21 VITALS — BP 118/60 | HR 83 | Ht 66.0 in | Wt 147.0 lb

## 2015-12-21 DIAGNOSIS — I6523 Occlusion and stenosis of bilateral carotid arteries: Secondary | ICD-10-CM

## 2015-12-21 DIAGNOSIS — I359 Nonrheumatic aortic valve disorder, unspecified: Secondary | ICD-10-CM

## 2015-12-21 DIAGNOSIS — I4821 Permanent atrial fibrillation: Secondary | ICD-10-CM

## 2015-12-21 DIAGNOSIS — I10 Essential (primary) hypertension: Secondary | ICD-10-CM | POA: Diagnosis not present

## 2015-12-21 DIAGNOSIS — I482 Chronic atrial fibrillation: Secondary | ICD-10-CM

## 2015-12-21 DIAGNOSIS — I679 Cerebrovascular disease, unspecified: Secondary | ICD-10-CM

## 2015-12-21 DIAGNOSIS — Z950 Presence of cardiac pacemaker: Secondary | ICD-10-CM | POA: Insufficient documentation

## 2015-12-21 NOTE — Assessment & Plan Note (Signed)
Continue statin. 

## 2015-12-21 NOTE — Patient Instructions (Signed)
Medication Instructions:   NO CHANGE  Testing/Procedures:  Your physician has requested that you have an echocardiogram. Echocardiography is a painless test that uses sound waves to create images of your heart. It provides your doctor with information about the size and shape of your heart and how well your heart's chambers and valves are working. This procedure takes approximately one hour. There are no restrictions for this procedure.   Your physician has requested that you have a carotid duplex. This test is an ultrasound of the carotid arteries in your neck. It looks at blood flow through these arteries that supply the brain with blood. Allow one hour for this exam. There are no restrictions or special instructions.    Follow-Up:  Your physician wants you to follow-up in: 6 MONTHS WITH DR CRENSHAW You will receive a reminder letter in the mail two months in advance. If you don't receive a letter, please call our office to schedule the follow-up appointment.      

## 2015-12-21 NOTE — Assessment & Plan Note (Signed)
Followed by Dr. Allred. 

## 2015-12-21 NOTE — Assessment & Plan Note (Signed)
Blood pressure controlled. Continue present medications. 

## 2015-12-21 NOTE — Assessment & Plan Note (Signed)
Plan follow-up echocardiogram to reassess. May require TAVR in the future.

## 2015-12-21 NOTE — Assessment & Plan Note (Signed)
Continue present medications for rate control. Continue Coumadin.Hemoglobin monitored by primary care.

## 2015-12-21 NOTE — Assessment & Plan Note (Signed)
Repeat carotid Dopplers. 

## 2015-12-22 ENCOUNTER — Other Ambulatory Visit: Payer: Self-pay

## 2015-12-22 DIAGNOSIS — Z1231 Encounter for screening mammogram for malignant neoplasm of breast: Secondary | ICD-10-CM

## 2015-12-29 NOTE — Addendum Note (Signed)
Addended by: Evans LanceSTOVER, Braelynn Benning W on: 12/29/2015 05:05 PM   Modules accepted: Orders

## 2015-12-31 ENCOUNTER — Telehealth: Payer: Self-pay | Admitting: Internal Medicine

## 2015-12-31 ENCOUNTER — Other Ambulatory Visit: Payer: Self-pay | Admitting: Cardiology

## 2015-12-31 ENCOUNTER — Encounter: Payer: Self-pay | Admitting: Internal Medicine

## 2015-12-31 ENCOUNTER — Ambulatory Visit (INDEPENDENT_AMBULATORY_CARE_PROVIDER_SITE_OTHER): Payer: Medicare Other | Admitting: *Deleted

## 2015-12-31 DIAGNOSIS — Z950 Presence of cardiac pacemaker: Secondary | ICD-10-CM

## 2015-12-31 DIAGNOSIS — W57XXXA Bitten or stung by nonvenomous insect and other nonvenomous arthropods, initial encounter: Secondary | ICD-10-CM

## 2015-12-31 LAB — CUP PACEART INCLINIC DEVICE CHECK
Lead Channel Impedance Value: 491 Ohm
Lead Channel Pacing Threshold Amplitude: 1 V
Lead Channel Pacing Threshold Pulse Width: 0.4 ms
Lead Channel Setting Pacing Amplitude: 2.5 V
Lead Channel Setting Pacing Pulse Width: 0.4 ms
MDC IDC LEAD IMPLANT DT: 20090917
MDC IDC LEAD LOCATION: 753860
MDC IDC MSMT LEADCHNL RV SENSING INTR AMPL: 12 mV
MDC IDC PG SERIAL: 2036664
MDC IDC SESS DTM: 20170331132010
MDC IDC SET LEADCHNL RV SENSING SENSITIVITY: 2 mV
MDC IDC STAT BRADY RV PERCENT PACED: 10 %
Pulse Gen Model: 5626

## 2015-12-31 MED ORDER — DOXYCYCLINE HYCLATE 100 MG PO TABS: 100.0000 mg | ORAL_TABLET | Freq: Two times a day (BID) | ORAL | Status: DC

## 2015-12-31 NOTE — Telephone Encounter (Signed)
Rx(s) sent to pharmacy electronically.  

## 2015-12-31 NOTE — Patient Instructions (Signed)
Schedule an appointment to get your INR checked on Monday, 01/03/16.  Wash the area over your pacemaker twice daily with antibacterial soap and warm water.  Call us if you develop fevers, chills, or signs of infection at the site.

## 2015-12-31 NOTE — Telephone Encounter (Signed)
Patient reports a red area about the that is red, itchy, and painful over her PPM.  She states that she noticed it yesterday.  She denies fever or chills.  [facility] waiting on her sister who is in surgery, so she plans to come over for a device clinic appointment shortly.  Patient is appreciative of call and denies additional questions at this time.

## 2015-12-31 NOTE — Progress Notes (Signed)
Patient seen as add-on in device clinic for painful, red "bump" over device pocket.  Patient reports that she noticed that the area was pruritic yesterday, and that as of this morning it is also painful and erythemic.  She states that she had her family members look at it and they recommended that she come in.  Patient denies fever, chills, or drainage from site.  Upon evaluation, noted that there was a tick embedded in the center of the quarter-sized area.  Patient reports that she was working in her garden near some azaleas two days ago and that the tick has likely been on her since then.  Dr. Lovena Le cleaned the site and removed all parts of the tick, including the head, using a suture kit and needle.  Bandaid applied after removal.  Dr. Lovena Le pescribed doxycycline '100mg'$  BID for 14 days per CDC recommendations for lyme prophylaxis and instructed patient to wash site with antibacterial soap and water BID for 5 days.  Spoke with [facility] regarding patient's warfarin and doxycycline--recommended to schedule Coumadin Clinic appointment on Monday, 01/03/16.  Patient instructed to call with fever, chills, or other symptoms of infection.  She verbalized understanding of all instructions.  Normal PPM function. Thresholds, sensing, impedances consistent with previous measurements. Device programmed to maximize longevity. No episode triggers enabled. Device programmed at appropriate safety margins. Histogram distribution appropriate for patient activity level. Device programmed to optimize intrinsic conduction. Estimated longevity 6.75-10 years. Patient education completed. ROV with AS as scheduled on 02/02/16.

## 2015-12-31 NOTE — Telephone Encounter (Signed)
°  New Prob   Pt states she has noted a bump with surrounding redness located on top of her pacemaker. Pt states area is mildly painful. Requesting to speak to a nurse. Please call.

## 2016-01-03 ENCOUNTER — Telehealth: Payer: Self-pay | Admitting: *Deleted

## 2016-01-03 ENCOUNTER — Encounter (HOSPITAL_COMMUNITY): Payer: Medicare Other

## 2016-01-03 ENCOUNTER — Ambulatory Visit (INDEPENDENT_AMBULATORY_CARE_PROVIDER_SITE_OTHER): Payer: Medicare Other | Admitting: Pharmacist

## 2016-01-03 DIAGNOSIS — Z5181 Encounter for therapeutic drug level monitoring: Secondary | ICD-10-CM | POA: Diagnosis not present

## 2016-01-03 DIAGNOSIS — I4891 Unspecified atrial fibrillation: Secondary | ICD-10-CM | POA: Diagnosis not present

## 2016-01-03 DIAGNOSIS — Z7901 Long term (current) use of anticoagulants: Secondary | ICD-10-CM | POA: Diagnosis not present

## 2016-01-03 LAB — POCT INR: INR: 2.9

## 2016-01-03 MED ORDER — FUROSEMIDE 40 MG PO TABS: ORAL_TABLET | ORAL | Status: DC

## 2016-01-03 NOTE — Telephone Encounter (Signed)
Paper refill request from [facility]

## 2016-01-07 ENCOUNTER — Ambulatory Visit (HOSPITAL_COMMUNITY)
Admission: RE | Admit: 2016-01-07 | Discharge: 2016-01-07 | Disposition: A | Discharge status: Home / Self Care | Payer: Medicare Other | Source: Ambulatory Visit | Attending: Internal Medicine | Admitting: Internal Medicine

## 2016-01-07 ENCOUNTER — Ambulatory Visit (INDEPENDENT_AMBULATORY_CARE_PROVIDER_SITE_OTHER): Payer: Medicare Other | Admitting: Pharmacist Clinician (PhC)/ Clinical Pharmacy Specialist

## 2016-01-07 ENCOUNTER — Ambulatory Visit (HOSPITAL_BASED_OUTPATIENT_CLINIC_OR_DEPARTMENT_OTHER)
Admission: RE | Admit: 2016-01-07 | Discharge: 2016-01-07 | Disposition: A | Discharge status: Home / Self Care | Payer: Medicare Other | Source: Ambulatory Visit | Attending: Internal Medicine | Admitting: Internal Medicine

## 2016-01-07 DIAGNOSIS — E119 Type 2 diabetes mellitus without complications: Secondary | ICD-10-CM | POA: Insufficient documentation

## 2016-01-07 DIAGNOSIS — E785 Hyperlipidemia, unspecified: Secondary | ICD-10-CM | POA: Diagnosis not present

## 2016-01-07 DIAGNOSIS — Z87891 Personal history of nicotine dependence: Secondary | ICD-10-CM | POA: Insufficient documentation

## 2016-01-07 DIAGNOSIS — I083 Combined rheumatic disorders of mitral, aortic and tricuspid valves: Secondary | ICD-10-CM | POA: Diagnosis not present

## 2016-01-07 DIAGNOSIS — I4891 Unspecified atrial fibrillation: Secondary | ICD-10-CM | POA: Diagnosis not present

## 2016-01-07 DIAGNOSIS — I6523 Occlusion and stenosis of bilateral carotid arteries: Secondary | ICD-10-CM | POA: Insufficient documentation

## 2016-01-07 DIAGNOSIS — Z7901 Long term (current) use of anticoagulants: Secondary | ICD-10-CM | POA: Diagnosis not present

## 2016-01-07 DIAGNOSIS — I359 Nonrheumatic aortic valve disorder, unspecified: Secondary | ICD-10-CM | POA: Diagnosis not present

## 2016-01-07 DIAGNOSIS — Z5181 Encounter for therapeutic drug level monitoring: Secondary | ICD-10-CM

## 2016-01-07 DIAGNOSIS — I10 Essential (primary) hypertension: Secondary | ICD-10-CM | POA: Diagnosis not present

## 2016-01-07 LAB — POCT INR: INR: 3.6

## 2016-01-11 ENCOUNTER — Other Ambulatory Visit: Payer: Self-pay | Admitting: *Deleted

## 2016-01-11 MED ORDER — SIMVASTATIN 20 MG PO TABS: 10.0000 mg | ORAL_TABLET | Freq: Every day | ORAL | Status: DC

## 2016-01-17 ENCOUNTER — Ambulatory Visit (INDEPENDENT_AMBULATORY_CARE_PROVIDER_SITE_OTHER): Payer: Medicare Other | Admitting: Pharmacist Clinician (PhC)/ Clinical Pharmacy Specialist

## 2016-01-17 ENCOUNTER — Encounter: Payer: Self-pay | Admitting: Pharmacist Clinician (PhC)/ Clinical Pharmacy Specialist

## 2016-01-17 DIAGNOSIS — Z7901 Long term (current) use of anticoagulants: Secondary | ICD-10-CM

## 2016-01-17 DIAGNOSIS — I4891 Unspecified atrial fibrillation: Secondary | ICD-10-CM

## 2016-01-17 DIAGNOSIS — Z5181 Encounter for therapeutic drug level monitoring: Secondary | ICD-10-CM | POA: Diagnosis not present

## 2016-01-17 LAB — POCT INR: INR: 2.3

## 2016-01-17 NOTE — Telephone Encounter (Signed)
FORWARD TO  KRISTIN 

## 2016-01-17 NOTE — Telephone Encounter (Signed)
This encounter was created in error - please disregard.

## 2016-01-17 NOTE — Telephone Encounter (Signed)
New message   Pt calling returning call

## 2016-01-17 NOTE — Telephone Encounter (Signed)
Per patient, [name] called, will forward to her

## 2016-01-20 ENCOUNTER — Other Ambulatory Visit: Payer: Self-pay | Admitting: *Deleted

## 2016-01-20 MED ORDER — WARFARIN SODIUM 5 MG PO TABS: ORAL_TABLET | ORAL | Status: DC

## 2016-01-20 NOTE — Progress Notes (Signed)
HPI: FU atrial fibrillation; previously followed by Dr Patty SermonsBrackbill. Nuclear study 2008 normal. Echocardiogram August 2013 showed normal LV systolic function, mild to moderate aortic stenosis with mean gradient 20 mmHg and mild to moderate aortic insufficiency. Echo repeated 4/17 showed EF 45-50, severe AS with mean gradient 40 mmHg; mild to moderate AI; mild to moderate MR; moderate biatrial enlargement; mild to moderate TR. Carotid dopplers 1/17 showed 1-39 bilateral stenosis. Since last seen, the patient has dyspnea with more extreme activities but not with routine activities. It is relieved with rest. It is not associated with chest pain. There is no orthopnea, PND or pedal edema. There is no syncope or palpitations. There is no exertional chest pain.   Current Outpatient Prescriptions  Medication Sig Dispense Refill  . amoxicillin (AMOXIL) 500 MG capsule Reported on 12/31/2015    . calcium carbonate (OS-CAL) 600 MG TABS Take 1,200 mg by mouth daily.      Marland Kitchen. CARTIA XT 240 MG 24 hr capsule TAKE ONE CAPSULE BY MOUTH ONCE DAILY 90 capsule 2  . digoxin (LANOXIN) 0.25 MG tablet TAKE ONE-HALF TABLET BY MOUTH ONCE DAILY OR AS DIRECTED 120 tablet 1  . doxycycline (VIBRA-TABS) 100 MG tablet Take 1 tablet (100 mg total) by mouth 2 (two) times daily. 28 tablet 0  . furosemide (LASIX) 40 MG tablet Take ONE tablet by mouth every other day alternating with TWO tablets every other day. 135 tablet 3  . gabapentin (NEURONTIN) 300 MG capsule Take 1 tablet by mouth in the am and 3 tablets by mouth in the pm    . HYDROcodone-acetaminophen (NORCO/VICODIN) 5-325 MG per tablet Take 1 tablet by mouth 3 (three) times daily as needed (for pain).     Marland Kitchen. levothyroxine (SYNTHROID, LEVOTHROID) 50 MCG tablet Take 50 mcg by mouth daily.      Marland Kitchen. lisinopril (PRINIVIL,ZESTRIL) 2.5 MG tablet Take 2.5 mg by mouth daily.    . metFORMIN (GLUCOPHAGE) 500 MG tablet Take 2 tablets by mouth 2 (two) times daily.    . metoprolol  (LOPRESSOR) 100 MG tablet TAKE ONE TABLET BY MOUTH TWICE DAILY 180 tablet 2  . Multiple Vitamins-Minerals (VISION-VITE PRESERVE PO) Take 1 capsule by mouth 2 (two) times daily.    . psyllium (METAMUCIL) 58.6 % powder Take 1 packet by mouth daily.    . simvastatin (ZOCOR) 20 MG tablet Take 0.5 tablets (10 mg total) by mouth daily. 30 tablet 0  . warfarin (COUMADIN) 5 MG tablet TAKE ONE-HALF TO ONE TABLET BY MOUTH ONCE DAILY 30 tablet 3   No current facility-administered medications for this visit.     Past Medical History  Diagnosis Date  . Hypertension     essential  . Diabetes mellitus   . CHF (congestive heart failure) (HCC)     compensated  . Hyperlipidemia   . Permanent atrial fibrillation (HCC)     on coumadin  . Tachycardia-bradycardia syndrome (HCC)     with single chamber ventricular pacemaker  . Hypercholesteremia     with intolerance to statin therapy  . Aortic stenosis   . History of peptic ulcer disease   . Peripheral neuropathy (HCC)   . Thrombocytopenia (HCC)   . Anemia associated with acute blood loss 03/11/2007   . Gastrointestinal bleed 03/11/2007   . Duodenal ulcer 03/11/2007   . Thyroid disease     Past Surgical History  Procedure Laterality Date  . Shoulder surgery  12/07/2004  . Pacemaker insertion  06/18/2008  SJM Zephyr XL SR implanted by Dr Reyes Ivan  . Breast biopsy  02/22/2007     left  . Cardioversion  05/16/2005    Electrical cardioversion   . Esophagogastroduodenoscopy  03/12/2007     Esophagogastroduodenoscopy with control of bleeding    Social History   Social History  . Marital Status: Married    Spouse Name: N/A  . Number of Children: N/A  . Years of Education: N/A   Occupational History  . Not on file.   Social History Main Topics  . Smoking status: Former Smoker    Types: Cigarettes    Quit date: 03/06/1961  . Smokeless tobacco: Not on file  . Alcohol Use: No  . Drug Use: No  . Sexual Activity: Not on file   Other  Topics Concern  . Not on file   Social History Narrative    Family History  Problem Relation Age of Onset  . Heart disease Father   . Emphysema Father   . Stroke Sister     cerebral hemorrhage  . Breast cancer Sister   . Heart disease Sister   . Heart failure Father   . Coronary artery disease Father   . Heart failure Sister     ROS: no fevers or chills, productive cough, hemoptysis, dysphasia, odynophagia, melena, hematochezia, dysuria, hematuria, rash, seizure activity, orthopnea, PND, pedal edema, claudication. Remaining systems are negative.  Physical Exam: Well-developed well-nourished in no acute distress.  Skin is warm and dry.  HEENT is normal.  Neck is supple.  Chest is clear to auscultation with normal expansion.  Cardiovascular exam is irregular. 3/6 systolic murmur Abdominal exam nontender or distended. No masses palpated. Extremities show trace edema. neuro grossly intact

## 2016-01-21 ENCOUNTER — Ambulatory Visit (INDEPENDENT_AMBULATORY_CARE_PROVIDER_SITE_OTHER): Payer: Medicare Other | Admitting: Cardiology

## 2016-01-21 ENCOUNTER — Encounter: Payer: Self-pay | Admitting: Cardiology

## 2016-01-21 VITALS — BP 120/60 | HR 70 | Ht 66.0 in | Wt 148.4 lb

## 2016-01-21 DIAGNOSIS — Z950 Presence of cardiac pacemaker: Secondary | ICD-10-CM

## 2016-01-21 DIAGNOSIS — I10 Essential (primary) hypertension: Secondary | ICD-10-CM

## 2016-01-21 DIAGNOSIS — I350 Nonrheumatic aortic (valve) stenosis: Secondary | ICD-10-CM

## 2016-01-21 DIAGNOSIS — I679 Cerebrovascular disease, unspecified: Secondary | ICD-10-CM

## 2016-01-21 DIAGNOSIS — I6523 Occlusion and stenosis of bilateral carotid arteries: Secondary | ICD-10-CM

## 2016-01-21 DIAGNOSIS — I4821 Permanent atrial fibrillation: Secondary | ICD-10-CM

## 2016-01-21 DIAGNOSIS — I482 Chronic atrial fibrillation: Secondary | ICD-10-CM | POA: Diagnosis not present

## 2016-01-21 NOTE — Assessment & Plan Note (Signed)
Long discussion with patient today concerning aortic stenosis.This has progressed (now severe) and she has mild to moderate aortic insufficiency. I personally reviewed her echocardiogram. I feel that her echocardiogram shows preserved LV function in the 50-55% range. She is not having symptoms. I explained that she will likely require aortic valve replacement in the future but at present she is not having any dyspnea, chest pain or syncope. We will see her back in 6 months to reassess and she will call us if she develops any of those symptoms. She would be a good candidate for TAVR in the future.

## 2016-01-21 NOTE — Assessment & Plan Note (Signed)
Continue statin. 

## 2016-01-21 NOTE — Assessment & Plan Note (Signed)
Blood pressure controlled. Continue present medications. 

## 2016-01-21 NOTE — Assessment & Plan Note (Signed)
Continue present medications for rate control. Continue Coumadin. 

## 2016-01-21 NOTE — Patient Instructions (Signed)
Your physician wants you to follow-up in: 6 MONTHS WITH DR CRENSHAW You will receive a reminder letter in the mail two months in advance. If you don't receive a letter, please call our office to schedule the follow-up appointment.   If you need a refill on your cardiac medications before your next appointment, please call your pharmacy.  

## 2016-01-21 NOTE — Assessment & Plan Note (Signed)
Followed by electrophysiology. 

## 2016-01-28 ENCOUNTER — Ambulatory Visit
Admission: RE | Admit: 2016-01-28 | Discharge: 2016-01-28 | Disposition: A | Discharge status: Home / Self Care | Payer: Medicare Other | Source: Ambulatory Visit

## 2016-01-28 DIAGNOSIS — Z1231 Encounter for screening mammogram for malignant neoplasm of breast: Secondary | ICD-10-CM

## 2016-02-01 ENCOUNTER — Encounter: Payer: Self-pay | Admitting: Nurse Practitioner

## 2016-02-01 NOTE — Progress Notes (Signed)
This encounter was created in error - please disregard.

## 2016-02-02 ENCOUNTER — Encounter: Payer: Medicare Other | Admitting: Nurse Practitioner

## 2016-02-14 ENCOUNTER — Encounter: Payer: Medicare Other | Admitting: Pharmacist Clinician (PhC)/ Clinical Pharmacy Specialist

## 2016-02-15 ENCOUNTER — Ambulatory Visit (INDEPENDENT_AMBULATORY_CARE_PROVIDER_SITE_OTHER): Payer: Medicare Other | Admitting: Pharmacist

## 2016-02-15 DIAGNOSIS — Z5181 Encounter for therapeutic drug level monitoring: Secondary | ICD-10-CM | POA: Diagnosis not present

## 2016-02-15 DIAGNOSIS — I4891 Unspecified atrial fibrillation: Secondary | ICD-10-CM

## 2016-02-15 DIAGNOSIS — Z7901 Long term (current) use of anticoagulants: Secondary | ICD-10-CM | POA: Diagnosis not present

## 2016-02-15 LAB — POCT INR: INR: 3.2

## 2016-02-16 ENCOUNTER — Telehealth: Payer: Self-pay | Admitting: Cardiology

## 2016-02-16 NOTE — Telephone Encounter (Signed)
Patient scheduled for appointment Monday morning with Dawayne PatriciaLori G NP  Aware of date and time

## 2016-02-16 NOTE — Telephone Encounter (Signed)
Paov; if worsening symptoms would need to consider referral for TAVR. Olga MillersBrian Crenshaw

## 2016-02-16 NOTE — Telephone Encounter (Signed)
Pt c/o Shortness Of Breath: STAT if SOB developed within the last 24 hours or pt is noticeably SOB on the phone  1. Are you currently SOB (can you hear that pt is SOB on the phone)? No   2. How long have you been experiencing SOB? Last night while working in the yard   3. Are you SOB when sitting or when up moving around? When moderate activity  4. Are you currently experiencing any other symptoms? Slight nausea and rapid heart rate

## 2016-02-16 NOTE — Telephone Encounter (Signed)
Spoke with patient and she worked in the yard after dinner but not anything unusual or difficult for her She worked for about an hour and walking into the house she was very shortness of breath and felt her heart pounding.  Did take her her 6-7 minutes to recover Patient feels fine today but is concerned after her last visit with Dr Jens Somrenshaw and issues with her valve.  She is willing to come for ov, will forward to Dr Jens Somrenshaw for review

## 2016-02-21 ENCOUNTER — Ambulatory Visit (INDEPENDENT_AMBULATORY_CARE_PROVIDER_SITE_OTHER): Payer: Medicare Other | Admitting: Nurse Practitioner

## 2016-02-21 ENCOUNTER — Encounter: Payer: Self-pay | Admitting: Internal Medicine

## 2016-02-21 ENCOUNTER — Encounter: Payer: Self-pay | Admitting: Nurse Practitioner

## 2016-02-21 VITALS — BP 124/60 | HR 74 | Ht 65.0 in | Wt 146.0 lb

## 2016-02-21 DIAGNOSIS — I482 Chronic atrial fibrillation: Secondary | ICD-10-CM

## 2016-02-21 DIAGNOSIS — I6523 Occlusion and stenosis of bilateral carotid arteries: Secondary | ICD-10-CM

## 2016-02-21 DIAGNOSIS — I4821 Permanent atrial fibrillation: Secondary | ICD-10-CM

## 2016-02-21 DIAGNOSIS — R0600 Dyspnea, unspecified: Secondary | ICD-10-CM

## 2016-02-21 NOTE — Progress Notes (Signed)
CARDIOLOGY OFFICE NOTE  Date:  02/21/2016    [name] Date of Birth: 15-Jul-1927 Medical Record #[id number]  PCP:  Lorenda PeckOBERTS, RONALD WAYNE, MD  Cardiologist:  Jens Somrenshaw    Chief Complaint  Patient presents with  . Shortness of Breath    Work in visit - seen for Dr. Jens Somrenshaw.     History of Present Illness: Janet BostonMillie H Fontenot is a 82 y.o. female who presents today for a work in visit. Seen for Dr. Jens Somrenshaw. Former patient of Dr. Patty SermonsBrackbill.   She has a history of HTN, DM, HLD and permanent AF. Has underlying PPM in place per Dr. Reyes IvanKersey back in 2009. Nuclear study 2008 normal. Echocardiogram August 2013 showed normal LV systolic function, mild to moderate aortic stenosis with mean gradient 20 mmHg and mild to moderate aortic insufficiency. Echo repeated 4/17 showed EF 45-50, severe AS with mean gradient 40 mmHg; mild to moderate AI; mild to moderate MR; moderate biatrial enlargement; mild to moderate TR. Carotid dopplers 1/17 showed 1-39 bilateral stenosis.   Seen just a month ago - endorsed some DOE but not at rest. Her AS was felt to be progressive. This has progressed (now severe) and she has mild to moderate aortic insufficiency. He felt that her echocardiogram shows preserved LV function in the 50-55% range. She was not having symptoms at that time.  It was explained that she will likely require aortic valve replacement in the future but at present she is not having any dyspnea, chest pain or syncope. She is felt to be a good candidate for TAVR in the future.  Comes back here today. Here alone. Last week had one spell of getting short of breath after working in the yard - walked to the house - went inside - all got better after about 2 minutes. The whole spell lasted maybe 2 or 3 minutes. She had been out in the heat. She said she was told to call if she had any shortness of breath. No syncope. No chest pain. For PPM check tomorrow - will get today while here. She says she has just had  complete labs and physical from PCP.   Past Medical History  Diagnosis Date  . Hypertension   . Diabetes mellitus   . Hyperlipidemia   . Permanent atrial fibrillation (HCC)   . Tachycardia-bradycardia syndrome (HCC)     a. s/p SJM single chamber PPM implanted by Dr Reyes IvanKersey 2009  . Hypercholesteremia   . Severe aortic stenosis   . History of peptic ulcer disease   . Peripheral neuropathy (HCC)   . Thrombocytopenia (HCC)   . Thyroid disease     Past Surgical History  Procedure Laterality Date  . Shoulder surgery  12/07/2004  . Pacemaker insertion  06/18/2008    SJM Zephyr XL SR implanted by Dr Reyes IvanKersey  . Breast biopsy  02/22/2007     left  . Cardioversion  05/16/2005    Electrical cardioversion   . Esophagogastroduodenoscopy  03/12/2007     Esophagogastroduodenoscopy with control of bleeding     Medications: Current Outpatient Prescriptions  Medication Sig Dispense Refill  . amoxicillin (AMOXIL) 500 MG capsule Reported on 12/31/2015    . calcium carbonate (OS-CAL) 600 MG TABS Take 1,200 mg by mouth daily.      Marland Kitchen. CARTIA XT 240 MG 24 hr capsule TAKE ONE CAPSULE BY MOUTH ONCE DAILY 90 capsule 2  . digoxin (LANOXIN) 0.25 MG tablet TAKE ONE-HALF TABLET BY MOUTH ONCE DAILY OR  AS DIRECTED 120 tablet 1  . doxycycline (VIBRA-TABS) 100 MG tablet Take 1 tablet (100 mg total) by mouth 2 (two) times daily. 28 tablet 0  . furosemide (LASIX) 40 MG tablet Take ONE tablet by mouth every other day alternating with TWO tablets every other day. 135 tablet 3  . gabapentin (NEURONTIN) 300 MG capsule Take 1 tablet by mouth in the am and 3 tablets by mouth in the pm    . HYDROcodone-acetaminophen (NORCO/VICODIN) 5-325 MG per tablet Take 1 tablet by mouth 3 (three) times daily as needed (for pain).     Marland Kitchen levothyroxine (SYNTHROID, LEVOTHROID) 50 MCG tablet Take 50 mcg by mouth daily.      Marland Kitchen lisinopril (PRINIVIL,ZESTRIL) 2.5 MG tablet Take 2.5 mg by mouth daily.    . metFORMIN (GLUCOPHAGE) 500 MG tablet  Take 2 tablets by mouth 2 (two) times daily.    . metoprolol (LOPRESSOR) 100 MG tablet TAKE ONE TABLET BY MOUTH TWICE DAILY 180 tablet 2  . Multiple Vitamins-Minerals (VISION-VITE PRESERVE PO) Take 1 capsule by mouth 2 (two) times daily.    . psyllium (METAMUCIL) 58.6 % powder Take 1 packet by mouth daily.    . simvastatin (ZOCOR) 20 MG tablet Take 0.5 tablets (10 mg total) by mouth daily. 30 tablet 0  . warfarin (COUMADIN) 5 MG tablet TAKE ONE-HALF TO ONE TABLET BY MOUTH ONCE DAILY 30 tablet 3   No current facility-administered medications for this visit.    Allergies: Allergies  Allergen Reactions  . Aspirin Other (See Comments)    Gi bleeding  . Codeine     GI Lead  . Lipitor [Atorvastatin Calcium]     Muscle Ache  . Pravachol     Muscle Ache    Social History: The patient  reports that she quit smoking about 55 years ago. Her smoking use included Cigarettes. She does not have any smokeless tobacco history on file. She reports that she does not drink alcohol or use illicit drugs.   Family History: The patient's family history includes Breast cancer in her sister; Coronary artery disease in her father; Emphysema in her father; Heart disease in her father and sister; Heart failure in her father and sister; Stroke in her sister.   Review of Systems: Please see the history of present illness.   Otherwise, the review of systems is positive for none.   All other systems are reviewed and negative.   Physical Exam: VS:  BP 124/60 mmHg  Pulse 74  Ht  (1.651 m)  Wt 146 lb (66.225 kg)  BMI 24.30 kg/m2  SpO2 99% .  BMI Body mass index is 24.3 kg/(m^2).  Wt Readings from Last 3 Encounters:  02/21/16 146 lb (66.225 kg)  01/21/16 148 lb 6.4 oz (67.314 kg)  12/21/15 147 lb (66.679 kg)    General: Pleasant. She looks younger than her stated age. She is alert and in no acute distress.  HEENT: Normal. Neck: Supple, no JVD, carotid bruits, or masses noted.  Cardiac: Fairly  regular rate and rhythm. Harsh outflow murmur. Trace edema. Brawny stasis changes in the lower legs.  Respiratory:  Lungs are clear to auscultation bilaterally with normal work of breathing.  GI: Soft and nontender.  MS: No deformity or atrophy. Gait and ROM intact. Skin: Warm and dry. Color is normal.  Neuro:  Strength and sensation are intact and no gross focal deficits noted.  Psych: Alert, appropriate and with normal affect.   LABORATORY DATA:  EKG:  EKG is not ordered today.  Lab Results  Component Value Date   WBC 8.6 03/17/2007   HGB 9.6* 03/17/2007   HCT 28.5* 03/17/2007   PLT 170 DELTA CHECK NOTED 03/17/2007   GLUCOSE 117* 03/17/2007   ALT 24 03/11/2007   AST 27 03/11/2007   NA 142 03/17/2007   K 3.6 03/17/2007   CL 104 03/17/2007   CREATININE 0.65 03/17/2007   BUN 15 03/17/2007   CO2 30 03/17/2007   INR 3.2 02/15/2016   HGBA1C  03/12/2007    6.0 (NOTE)   The ADA recommends the following therapeutic goals for glycemic   control related to Hgb A1C measurement:   Goal of Therapy:   < 7.0% Hgb A1C   Action Suggested:  > 8.0% Hgb A1C   Ref:  Diabetes Care, 22, Suppl. 1, 1999    BNP (last 3 results) No results for input(s): BNP in the last 8760 hours.  ProBNP (last 3 results) No results for input(s): PROBNP in the last 8760 hours.   Other Studies Reviewed Today:  Echo Study Conclusions from 01/2016  - Left ventricle: The cavity size was normal. There was moderate  concentric hypertrophy. Systolic function was mildly reduced. The  estimated ejection fraction was in the range of 45% to 50%. Mild  diffuse hypokinesis with no identifiable regional variations. - Aortic valve: Valve mobility was severely restricted. There was  severe stenosis. There was mild to moderate regurgitation  directed centrally in the LVOT. Valve area (VTI): 0.9 cm^2. - Mitral valve: Calcified annulus. Moderately thickened, mildly  calcified leaflets . There was mild to moderate  regurgitation  directed centrally. Valve area by continuity equation (using LVOT  flow): 1.93 cm^2. - Left atrium: The atrium was moderately dilated. - Right ventricle: Systolic function was mildly reduced. - Right atrium: The atrium was moderately dilated. - Tricuspid valve: There was mild-moderate regurgitation directed  centrally. - Pulmonary arteries: Systolic pressure was mildly increased. PA  peak pressure: 37 mm Hg (S).   Assessment/Plan: 1. Progressive aortic stenosis - has had just a brief - one time spell of dyspnea last week - would follow for now - see back if continues/worsens.   2. Chronic AF  3. Chronic anticoagulation - on coumadin  4. PPM - checking today  5. HLD - on statin therapy  Current medicines are reviewed with the patient today.  The patient does not have concerns regarding medicines other than what has been noted above.  The following changes have been made:  See above.  Labs/ tests ordered today include:   No orders of the defined types were placed in this encounter.     Disposition:   FU with Dr. Jens Som in 3 months.   Patient is agreeable to this plan and will call if any problems develop in the interim.   Signed: Rosalio Macadamia, RN, ANP-C 02/21/2016 9:26 AM  [facility] [address] [place], [place]  [address] Phone: [phone] Fax: [phone]

## 2016-02-21 NOTE — Progress Notes (Signed)
This encounter was created in error - please disregard.

## 2016-02-21 NOTE — Patient Instructions (Addendum)
We will be checking the following labs today - NONE   Medication Instructions:    Continue with your current medicines.     Testing/Procedures To Be Arranged:  N/A  Follow-Up:   See Dr. Jens Somrenshaw in 3 months.     Other Special Instructions:   Keep a diary to track your symptoms and how you are feeling.     If you need a refill on your cardiac medications before your next appointment, please call your pharmacy.   Call the [facility] office at [phone] if you have any questions, problems or concerns.

## 2016-02-21 NOTE — Progress Notes (Signed)
PPM check today (pt was scheduled to see me in clinic tomorrow).  Normal device function V pacing 9% of the time Est longevity 7-10 years No changes today Follow up Dr Johney FrameAllred 1 year  Gypsy BalsamAmber Seiler, NP 02/21/2016 9:35 AM

## 2016-02-22 ENCOUNTER — Encounter: Payer: Medicare Other | Admitting: Nurse Practitioner

## 2016-03-03 ENCOUNTER — Other Ambulatory Visit: Payer: Self-pay | Admitting: *Deleted

## 2016-03-03 ENCOUNTER — Other Ambulatory Visit: Payer: Self-pay | Admitting: Cardiology

## 2016-03-03 MED ORDER — DIGOXIN 250 MCG PO TABS: 0.2500 mg | ORAL_TABLET | Freq: Every day | ORAL | Status: DC

## 2016-03-03 NOTE — Telephone Encounter (Signed)
Rx(s) sent to pharmacy electronically.  

## 2016-03-09 ENCOUNTER — Ambulatory Visit (INDEPENDENT_AMBULATORY_CARE_PROVIDER_SITE_OTHER): Payer: Medicare Other | Admitting: Pharmacist Clinician (PhC)/ Clinical Pharmacy Specialist

## 2016-03-09 DIAGNOSIS — Z5181 Encounter for therapeutic drug level monitoring: Secondary | ICD-10-CM | POA: Diagnosis not present

## 2016-03-09 DIAGNOSIS — I4891 Unspecified atrial fibrillation: Secondary | ICD-10-CM

## 2016-03-09 DIAGNOSIS — Z7901 Long term (current) use of anticoagulants: Secondary | ICD-10-CM

## 2016-03-09 LAB — POCT INR: INR: 2.2

## 2016-04-12 ENCOUNTER — Ambulatory Visit (INDEPENDENT_AMBULATORY_CARE_PROVIDER_SITE_OTHER): Payer: Medicare Other | Admitting: Pharmacist

## 2016-04-12 DIAGNOSIS — Z5181 Encounter for therapeutic drug level monitoring: Secondary | ICD-10-CM

## 2016-04-12 DIAGNOSIS — I4891 Unspecified atrial fibrillation: Secondary | ICD-10-CM

## 2016-04-12 DIAGNOSIS — Z7901 Long term (current) use of anticoagulants: Secondary | ICD-10-CM

## 2016-04-12 LAB — POCT INR: INR: 2.2

## 2016-04-13 ENCOUNTER — Encounter: Payer: Medicare Other | Admitting: Nurse Practitioner

## 2016-05-09 ENCOUNTER — Other Ambulatory Visit: Payer: Self-pay | Admitting: *Deleted

## 2016-05-09 MED ORDER — METOPROLOL TARTRATE 100 MG PO TABS: 100.0000 mg | ORAL_TABLET | Freq: Two times a day (BID) | ORAL | 0 refills | Status: DC

## 2016-05-18 ENCOUNTER — Encounter: Payer: Self-pay | Admitting: Cardiology

## 2016-05-31 NOTE — Progress Notes (Signed)
HPI: FU atrial fibrillation. Nuclear study 2008 normal. Echocardiogram August 2013 showed normal LV systolic function, mild to moderate aortic stenosis with mean gradient 20 mmHg and mild to moderate aortic insufficiency. Echo repeated 4/17 showed EF 45-50, severe AS with mean gradient 40 mmHg; mild to moderate AI; mild to moderate MR; moderate biatrial enlargement; mild to moderate TR. Carotid dopplers 4/17 showed 1-39 bilateral stenosis. Since last seen, She notes increasing dyspnea on exertion over the last several months. No orthopnea, PND, syncope or chest pain. Occasional palpitations.  Current Outpatient Prescriptions  Medication Sig Dispense Refill  . amoxicillin (AMOXIL) 500 MG capsule Reported on 12/31/2015    . calcium carbonate (OS-CAL) 600 MG TABS Take 1,200 mg by mouth daily.      Marland Kitchen CARTIA XT 240 MG 24 hr capsule TAKE ONE CAPSULE BY MOUTH ONCE DAILY 90 capsule 2  . digoxin (LANOXIN) 0.25 MG tablet Take 1 tablet (0.25 mg total) by mouth daily. 120 tablet 0  . doxycycline (VIBRA-TABS) 100 MG tablet Take 1 tablet (100 mg total) by mouth 2 (two) times daily. 28 tablet 0  . furosemide (LASIX) 40 MG tablet Take ONE tablet by mouth every other day alternating with TWO tablets every other day. 135 tablet 3  . gabapentin (NEURONTIN) 300 MG capsule Take 1 tablet by mouth in the am and 3 tablets by mouth in the pm    . HYDROcodone-acetaminophen (NORCO/VICODIN) 5-325 MG per tablet Take 1 tablet by mouth 3 (three) times daily as needed (for pain).     Marland Kitchen levothyroxine (SYNTHROID, LEVOTHROID) 50 MCG tablet Take 50 mcg by mouth daily.      Marland Kitchen lisinopril (PRINIVIL,ZESTRIL) 2.5 MG tablet Take 2.5 mg by mouth daily.    . metFORMIN (GLUCOPHAGE) 500 MG tablet Take 2 tablets by mouth 2 (two) times daily.    . metoprolol (LOPRESSOR) 100 MG tablet Take 1 tablet (100 mg total) by mouth 2 (two) times daily. 180 tablet 0  . Multiple Vitamins-Minerals (VISION-VITE PRESERVE PO) Take 1 capsule by mouth 2  (two) times daily.    . psyllium (METAMUCIL) 58.6 % powder Take 1 packet by mouth daily.    . simvastatin (ZOCOR) 20 MG tablet TAKE ONE-HALF TABLET BY MOUTH ONCE DAILY 30 tablet 10  . warfarin (COUMADIN) 5 MG tablet TAKE ONE-HALF TO ONE TABLET BY MOUTH ONCE DAILY 30 tablet 3   No current facility-administered medications for this visit.      Past Medical History:  Diagnosis Date  . Diabetes mellitus   . History of peptic ulcer disease   . Hypercholesteremia   . Hyperlipidemia   . Hypertension   . Peripheral neuropathy (HCC)   . Permanent atrial fibrillation (HCC)   . Severe aortic stenosis   . Tachycardia-bradycardia syndrome (HCC)    a. s/p SJM single chamber PPM implanted by Dr Reyes Ivan 2009  . Thrombocytopenia (HCC)   . Thyroid disease     Past Surgical History:  Procedure Laterality Date  . BREAST BIOPSY  02/22/2007    left  . CARDIOVERSION  05/16/2005   Electrical cardioversion   . ESOPHAGOGASTRODUODENOSCOPY  03/12/2007    Esophagogastroduodenoscopy with control of bleeding  . PACEMAKER INSERTION  06/18/2008   SJM Zephyr XL SR implanted by Dr Reyes Ivan  . SHOULDER SURGERY  12/07/2004    Social History   Social History  . Marital status: Married    Spouse name: N/A  . Number of children: N/A  . Years of education: N/A  Occupational History  . Not on file.   Social History Main Topics  . Smoking status: Former Smoker    Types: Cigarettes    Quit date: 03/06/1961  . Smokeless tobacco: Never Used  . Alcohol use No  . Drug use: No  . Sexual activity: Not on file   Other Topics Concern  . Not on file   Social History Narrative  . No narrative on file    Family History  Problem Relation Age of Onset  . Heart disease Father   . Emphysema Father   . Heart failure Father   . Coronary artery disease Father   . Stroke Sister     cerebral hemorrhage  . Breast cancer Sister   . Heart disease Sister   . Heart failure Sister     ROS: no fevers or chills,  productive cough, hemoptysis, dysphasia, odynophagia, melena, hematochezia, dysuria, hematuria, rash, seizure activity, orthopnea, PND, claudication. Remaining systems are negative.  Physical Exam: Well-developed well-nourished in no acute distress.  Skin is warm and dry.  HEENT is normal.  Neck is supple.  Chest is clear to auscultation with normal expansion.  Cardiovascular exam is irregular, 3/6 systolic murmur radiating to the carotids. S2 is diminished. Abdominal exam nontender or distended. No masses palpated. Extremities show trace edema. neuro grossly intact   A/P  1 Aortic stenosis-patient has developed increasing dyspnea on exertion likely secondary to her aortic valve disease. Previous echocardiogram showed severe aortic stenosis and mild to moderate aortic insufficiency. There was also mild to moderate mitral regurgitation. I think it is time to consider aortic valve replacement. We will arrange right and left cardiac catheterization with either Dr. Excell Seltzerooper or Dr. Clifton JamesMcalhany as she would be a good TAVR candidate. The risks and benefits were discussed and she agrees to proceed. These include myocardial infarction, CVA and death. Hold Glucophage the day of her procedure in 48 hours after. Hold Coumadin 5 days prior to procedure and resume the day of. We will arrange follow-up in valve clinic after catheterization.  2 permanent atrial fibrillation-continue present medications for rate control. Continue Coumadin.  3 pacemaker-followed by electrophysiology.  4 hyperlipidemia-continue statin.  5 hypertension-continue present blood pressure medication.  6 cerebrovascular disease-continue statin. No aspirin given need for Coumadin.  Olga MillersBrian Petina Muraski, MD

## 2016-06-01 ENCOUNTER — Ambulatory Visit (INDEPENDENT_AMBULATORY_CARE_PROVIDER_SITE_OTHER): Payer: Medicare Other | Admitting: Cardiology

## 2016-06-01 ENCOUNTER — Other Ambulatory Visit: Payer: Self-pay | Admitting: *Deleted

## 2016-06-01 ENCOUNTER — Encounter: Payer: Self-pay | Admitting: Cardiology

## 2016-06-01 ENCOUNTER — Other Ambulatory Visit: Payer: Self-pay | Admitting: Cardiology

## 2016-06-01 ENCOUNTER — Ambulatory Visit (INDEPENDENT_AMBULATORY_CARE_PROVIDER_SITE_OTHER): Payer: Medicare Other | Admitting: Pharmacist Clinician (PhC)/ Clinical Pharmacy Specialist

## 2016-06-01 ENCOUNTER — Telehealth: Payer: Self-pay | Admitting: *Deleted

## 2016-06-01 ENCOUNTER — Encounter: Payer: Self-pay | Admitting: Cardiovascular Disease

## 2016-06-01 VITALS — BP 120/60 | HR 63 | Ht 66.0 in | Wt 145.0 lb

## 2016-06-01 DIAGNOSIS — Z79899 Other long term (current) drug therapy: Secondary | ICD-10-CM

## 2016-06-01 DIAGNOSIS — I6523 Occlusion and stenosis of bilateral carotid arteries: Secondary | ICD-10-CM | POA: Diagnosis not present

## 2016-06-01 DIAGNOSIS — D689 Coagulation defect, unspecified: Secondary | ICD-10-CM

## 2016-06-01 DIAGNOSIS — Z7901 Long term (current) use of anticoagulants: Secondary | ICD-10-CM | POA: Diagnosis not present

## 2016-06-01 DIAGNOSIS — I350 Nonrheumatic aortic (valve) stenosis: Secondary | ICD-10-CM

## 2016-06-01 DIAGNOSIS — Z01818 Encounter for other preprocedural examination: Secondary | ICD-10-CM

## 2016-06-01 DIAGNOSIS — I4891 Unspecified atrial fibrillation: Secondary | ICD-10-CM

## 2016-06-01 DIAGNOSIS — Z5181 Encounter for therapeutic drug level monitoring: Secondary | ICD-10-CM

## 2016-06-01 LAB — CBC WITH DIFFERENTIAL/PLATELET
BASOS ABS: 0 {cells}/uL (ref 0–200)
Basophils Relative: 0 %
EOS ABS: 155 {cells}/uL (ref 15–500)
Eosinophils Relative: 1 %
HCT: 42.7 % (ref 35.0–45.0)
Hemoglobin: 14.2 g/dL (ref 11.7–15.5)
LYMPHS PCT: 21 %
Lymphs Abs: 3255 cells/uL (ref 850–3900)
MCH: 30.1 pg (ref 27.0–33.0)
MCHC: 33.3 g/dL (ref 32.0–36.0)
MCV: 90.5 fL (ref 80.0–100.0)
MONOS PCT: 10 %
MPV: 10.5 fL (ref 7.5–12.5)
Monocytes Absolute: 1550 cells/uL — ABNORMAL HIGH (ref 200–950)
Neutro Abs: 10540 cells/uL — ABNORMAL HIGH (ref 1500–7800)
Neutrophils Relative %: 68 %
PLATELETS: 289 10*3/uL (ref 140–400)
RBC: 4.72 MIL/uL (ref 3.80–5.10)
RDW: 14 % (ref 11.0–15.0)
WBC: 15.5 10*3/uL — ABNORMAL HIGH (ref 3.8–10.8)

## 2016-06-01 LAB — BASIC METABOLIC PANEL
BUN: 28 mg/dL — ABNORMAL HIGH (ref 7–25)
CALCIUM: 9.4 mg/dL (ref 8.6–10.4)
CO2: 27 mmol/L (ref 20–31)
CREATININE: 0.87 mg/dL (ref 0.60–0.88)
Chloride: 97 mmol/L — ABNORMAL LOW (ref 98–110)
Glucose, Bld: 112 mg/dL — ABNORMAL HIGH (ref 65–99)
Potassium: 4.6 mmol/L (ref 3.5–5.3)
Sodium: 136 mmol/L (ref 135–146)

## 2016-06-01 LAB — POCT INR: INR: 5.1

## 2016-06-01 LAB — TSH: TSH: 3.25 m[IU]/L

## 2016-06-01 NOTE — Telephone Encounter (Signed)
Patient has right and left heart cath scheduled for 06/14/16 with Dr Excell Seltzerooper.

## 2016-06-01 NOTE — Telephone Encounter (Signed)
Called patient to let her know that if she needs an appt with one of the cardiologists for TAVR procedure, then someone will call her. Per Julieta GuttingLauren Brown, RN for Dr Excell Seltzerooper, she will call patient if that is necessary.  Patient also had left her paperwork form Coumadin clinic. Went over instructions on holing coumadin x3 days and restart on Sunday sept 3 1/2 tablet daily and reminded her of appt on 06/08/16 at 11:15. Patient verbalized understanding and repeated back the instructions.

## 2016-06-01 NOTE — Patient Instructions (Addendum)
Medication Instructions:  PRIOR TO PROCEDURE: 1- DO NOT TAKE YOUR Metformin(Glucophage) the day of the procedure of for 48 hours after the procedure. 2- DO NOT TAKE YOUR COUMADIN for 5 days prior to the procedure. You may restart it back the day of the procedure.  Your physician recommends that you continue on your current medications as directed. Please refer to the Current Medication list given to you today.   Testing/Procedures: Your physician has requested that you have a RIGHT AND LEFT cardiac catheterization. Cardiac catheterization is used to diagnose and/or treat various heart conditions. Doctors may recommend this procedure for a number of different reasons. The most common reason is to evaluate chest pain. Chest pain can be a symptom of coronary artery disease (CAD), and cardiac catheterization can show whether plaque is narrowing or blocking your heart's arteries. This procedure is also used to evaluate the valves, as well as measure the blood flow and oxygen levels in different parts of your heart. For further information please visit [email or web address].  SCHEDULE TO BE DONE WITH DR COOPER OR DR Sanjuana KavaMCALHANEY.  Following your catheterization, you will not be allowed to drive for 3 days.  No lifting, pushing, or pulling greater that 10 pounds is allowed for 1 week.  You will be required to have the following tests prior to the procedure:  1. Blood work-the blood work can be done no more than 14 days prior to the procedure.  It can be done at any [facility] lab.  There is one downstairs on the first floor of this building and one in the [facility] building [phone]([address], [address]).  2. Chest Xray-the chest xray order has already [facility] placed [facility].      Puncture site    If you need a refill on your cardiac medications before your next appointment, please call your pharmacy.

## 2016-06-02 LAB — PROTIME-INR
INR: 5.7 — ABNORMAL HIGH
Prothrombin Time: 55.4 s — ABNORMAL HIGH (ref 9.0–11.5)

## 2016-06-02 LAB — APTT: APTT: 45 s — AB (ref 22–34)

## 2016-06-07 ENCOUNTER — Ambulatory Visit
Admission: RE | Admit: 2016-06-07 | Discharge: 2016-06-07 | Disposition: A | Discharge status: Home / Self Care | Payer: Medicare Other | Source: Ambulatory Visit | Attending: Cardiology | Admitting: Cardiology

## 2016-06-07 ENCOUNTER — Ambulatory Visit (INDEPENDENT_AMBULATORY_CARE_PROVIDER_SITE_OTHER): Payer: Medicare Other | Admitting: Pharmacist Clinician (PhC)/ Clinical Pharmacy Specialist

## 2016-06-07 DIAGNOSIS — Z5181 Encounter for therapeutic drug level monitoring: Secondary | ICD-10-CM

## 2016-06-07 DIAGNOSIS — Z01818 Encounter for other preprocedural examination: Secondary | ICD-10-CM

## 2016-06-07 DIAGNOSIS — I350 Nonrheumatic aortic (valve) stenosis: Secondary | ICD-10-CM

## 2016-06-07 DIAGNOSIS — D689 Coagulation defect, unspecified: Secondary | ICD-10-CM

## 2016-06-07 DIAGNOSIS — Z79899 Other long term (current) drug therapy: Secondary | ICD-10-CM

## 2016-06-08 ENCOUNTER — Ambulatory Visit (INDEPENDENT_AMBULATORY_CARE_PROVIDER_SITE_OTHER): Payer: Medicare Other | Admitting: Pharmacist Clinician (PhC)/ Clinical Pharmacy Specialist

## 2016-06-08 DIAGNOSIS — Z5181 Encounter for therapeutic drug level monitoring: Secondary | ICD-10-CM | POA: Diagnosis not present

## 2016-06-08 DIAGNOSIS — Z7901 Long term (current) use of anticoagulants: Secondary | ICD-10-CM

## 2016-06-08 DIAGNOSIS — I4891 Unspecified atrial fibrillation: Secondary | ICD-10-CM

## 2016-06-08 LAB — POCT INR: INR: 1.6

## 2016-06-13 ENCOUNTER — Other Ambulatory Visit: Payer: Self-pay | Admitting: *Deleted

## 2016-06-13 MED ORDER — DILTIAZEM HCL ER COATED BEADS 240 MG PO CP24: 240.0000 mg | ORAL_CAPSULE | Freq: Every day | ORAL | 3 refills | Status: DC

## 2016-06-14 ENCOUNTER — Ambulatory Visit (HOSPITAL_COMMUNITY)
Admission: RE | Admit: 2016-06-14 | Discharge: 2016-06-14 | Disposition: A | Discharge status: Home / Self Care | Payer: Medicare Other | Source: Ambulatory Visit | Attending: Cardiovascular Disease | Admitting: Cardiovascular Disease

## 2016-06-14 ENCOUNTER — Encounter (HOSPITAL_COMMUNITY)
Admission: RE | Disposition: A | Discharge status: Home / Self Care | Payer: Self-pay | Source: Ambulatory Visit | Attending: Cardiovascular Disease

## 2016-06-14 ENCOUNTER — Other Ambulatory Visit: Payer: Self-pay | Admitting: *Deleted

## 2016-06-14 DIAGNOSIS — I482 Chronic atrial fibrillation: Secondary | ICD-10-CM | POA: Diagnosis not present

## 2016-06-14 DIAGNOSIS — I080 Rheumatic disorders of both mitral and aortic valves: Secondary | ICD-10-CM | POA: Insufficient documentation

## 2016-06-14 DIAGNOSIS — I10 Essential (primary) hypertension: Secondary | ICD-10-CM | POA: Diagnosis not present

## 2016-06-14 DIAGNOSIS — E7800 Pure hypercholesterolemia, unspecified: Secondary | ICD-10-CM | POA: Insufficient documentation

## 2016-06-14 DIAGNOSIS — I350 Nonrheumatic aortic (valve) stenosis: Secondary | ICD-10-CM | POA: Diagnosis not present

## 2016-06-14 DIAGNOSIS — D696 Thrombocytopenia, unspecified: Secondary | ICD-10-CM | POA: Diagnosis not present

## 2016-06-14 DIAGNOSIS — Z803 Family history of malignant neoplasm of breast: Secondary | ICD-10-CM | POA: Insufficient documentation

## 2016-06-14 DIAGNOSIS — E785 Hyperlipidemia, unspecified: Secondary | ICD-10-CM | POA: Insufficient documentation

## 2016-06-14 DIAGNOSIS — E079 Disorder of thyroid, unspecified: Secondary | ICD-10-CM | POA: Insufficient documentation

## 2016-06-14 DIAGNOSIS — Z7984 Long term (current) use of oral hypoglycemic drugs: Secondary | ICD-10-CM | POA: Diagnosis not present

## 2016-06-14 DIAGNOSIS — Z950 Presence of cardiac pacemaker: Secondary | ICD-10-CM | POA: Insufficient documentation

## 2016-06-14 DIAGNOSIS — Z87891 Personal history of nicotine dependence: Secondary | ICD-10-CM | POA: Diagnosis not present

## 2016-06-14 DIAGNOSIS — Z7901 Long term (current) use of anticoagulants: Secondary | ICD-10-CM | POA: Insufficient documentation

## 2016-06-14 DIAGNOSIS — Z8249 Family history of ischemic heart disease and other diseases of the circulatory system: Secondary | ICD-10-CM | POA: Insufficient documentation

## 2016-06-14 DIAGNOSIS — E1142 Type 2 diabetes mellitus with diabetic polyneuropathy: Secondary | ICD-10-CM | POA: Insufficient documentation

## 2016-06-14 DIAGNOSIS — I679 Cerebrovascular disease, unspecified: Secondary | ICD-10-CM | POA: Diagnosis not present

## 2016-06-14 DIAGNOSIS — Z8711 Personal history of peptic ulcer disease: Secondary | ICD-10-CM | POA: Insufficient documentation

## 2016-06-14 HISTORY — PX: CARDIAC CATHETERIZATION: SHX172

## 2016-06-14 LAB — PROTIME-INR
INR: 1.06
PROTHROMBIN TIME: 13.8 s (ref 11.4–15.2)

## 2016-06-14 LAB — GLUCOSE, CAPILLARY: GLUCOSE-CAPILLARY: 144 mg/dL — AB (ref 65–99)

## 2016-06-14 SURGERY — LEFT HEART CATH AND CORONARY ANGIOGRAPHY

## 2016-06-14 MED ORDER — SODIUM CHLORIDE 0.9% FLUSH: 3.0000 mL | Freq: Two times a day (BID) | INTRAVENOUS | Status: DC

## 2016-06-14 MED ORDER — SODIUM CHLORIDE 0.9 % IV SOLN: 250.0000 mL | INTRAVENOUS | Status: DC | PRN

## 2016-06-14 MED ORDER — LIDOCAINE HCL (PF) 1 % IJ SOLN
INTRAMUSCULAR | Status: AC
  Filled 2016-06-14: qty 30

## 2016-06-14 MED ORDER — FENTANYL CITRATE (PF) 100 MCG/2ML IJ SOLN
INTRAMUSCULAR | Status: AC
  Filled 2016-06-14: qty 2

## 2016-06-14 MED ORDER — MIDAZOLAM HCL 2 MG/2ML IJ SOLN
INTRAMUSCULAR | Status: DC | PRN
  Administered 2016-06-14: 1 mg via INTRAVENOUS

## 2016-06-14 MED ORDER — HEPARIN (PORCINE) IN NACL 2-0.9 UNIT/ML-% IJ SOLN
INTRAMUSCULAR | Status: AC
  Filled 2016-06-14: qty 500

## 2016-06-14 MED ORDER — HEPARIN (PORCINE) IN NACL 2-0.9 UNIT/ML-% IJ SOLN
INTRAMUSCULAR | Status: DC | PRN
  Administered 2016-06-14: 1500 mL

## 2016-06-14 MED ORDER — ACETAMINOPHEN 325 MG PO TABS: 650.0000 mg | ORAL_TABLET | ORAL | Status: DC | PRN

## 2016-06-14 MED ORDER — SODIUM CHLORIDE 0.9% FLUSH: 3.0000 mL | INTRAVENOUS | Status: DC | PRN

## 2016-06-14 MED ORDER — IOPAMIDOL (ISOVUE-370) INJECTION 76%
INTRAVENOUS | Status: DC | PRN
  Administered 2016-06-14: 80 mL via INTRA_ARTERIAL

## 2016-06-14 MED ORDER — VERAPAMIL HCL 2.5 MG/ML IV SOLN
INTRAVENOUS | Status: DC | PRN
  Administered 2016-06-14: 10 mL via INTRA_ARTERIAL

## 2016-06-14 MED ORDER — LIDOCAINE HCL (PF) 1 % IJ SOLN
INTRAMUSCULAR | Status: DC | PRN
  Administered 2016-06-14 (×2): 2 mL

## 2016-06-14 MED ORDER — SODIUM CHLORIDE 0.9 % WEIGHT BASED INFUSION: 3.0000 mL/kg/h | INTRAVENOUS | Status: DC

## 2016-06-14 MED ORDER — ASPIRIN 81 MG PO CHEW
CHEWABLE_TABLET | ORAL | Status: AC
  Filled 2016-06-14: qty 1

## 2016-06-14 MED ORDER — HEPARIN SODIUM (PORCINE) 1000 UNIT/ML IJ SOLN
INTRAMUSCULAR | Status: DC | PRN
  Administered 2016-06-14: 3000 [IU] via INTRAVENOUS

## 2016-06-14 MED ORDER — FENTANYL CITRATE (PF) 100 MCG/2ML IJ SOLN
INTRAMUSCULAR | Status: DC | PRN
  Administered 2016-06-14: 25 ug via INTRAVENOUS

## 2016-06-14 MED ORDER — SODIUM CHLORIDE 0.9 % WEIGHT BASED INFUSION: 1.0000 mL/kg/h | INTRAVENOUS | Status: DC

## 2016-06-14 MED ORDER — VERAPAMIL HCL 2.5 MG/ML IV SOLN
INTRAVENOUS | Status: AC
  Filled 2016-06-14: qty 2

## 2016-06-14 MED ORDER — MIDAZOLAM HCL 2 MG/2ML IJ SOLN
INTRAMUSCULAR | Status: AC
  Filled 2016-06-14: qty 2

## 2016-06-14 MED ORDER — IOPAMIDOL (ISOVUE-370) INJECTION 76%
INTRAVENOUS | Status: AC
  Filled 2016-06-14: qty 100

## 2016-06-14 MED ORDER — ONDANSETRON HCL 4 MG/2ML IJ SOLN: 4.0000 mg | Freq: Four times a day (QID) | INTRAMUSCULAR | Status: DC | PRN

## 2016-06-14 MED ORDER — HEPARIN SODIUM (PORCINE) 1000 UNIT/ML IJ SOLN
INTRAMUSCULAR | Status: AC
  Filled 2016-06-14: qty 1

## 2016-06-14 MED ORDER — SODIUM CHLORIDE 0.9 % WEIGHT BASED INFUSION
3.0000 mL/kg/h | INTRAVENOUS | Status: AC
  Administered 2016-06-14: 3 mL/kg/h via INTRAVENOUS

## 2016-06-14 MED ORDER — ASPIRIN 81 MG PO CHEW: 81.0000 mg | CHEWABLE_TABLET | ORAL | Status: DC

## 2016-06-14 MED ORDER — HEPARIN (PORCINE) IN NACL 2-0.9 UNIT/ML-% IJ SOLN
INTRAMUSCULAR | Status: AC
  Filled 2016-06-14: qty 1000

## 2016-06-14 SURGICAL SUPPLY — 15 items
CATH BALLN WEDGE 5F 110CM (CATHETERS) ×3 IMPLANT
CATH INFINITI 5 FR JL3.5 (CATHETERS) ×3 IMPLANT
CATH INFINITI 5FR ANG PIGTAIL (CATHETERS) ×3 IMPLANT
CATH INFINITI JR4 5F (CATHETERS) ×3 IMPLANT
DEVICE RAD COMP TR BAND LRG (VASCULAR PRODUCTS) ×3 IMPLANT
GLIDESHEATH SLEND SS 6F .021 (SHEATH) ×3 IMPLANT
KIT HEART LEFT (KITS) ×3 IMPLANT
PACK CARDIAC CATHETERIZATION (CUSTOM PROCEDURE TRAY) ×3 IMPLANT
SHEATH FAST CATH BRACH 5F 5CM (SHEATH) ×3 IMPLANT
SYR MEDRAD MARK V 150ML (SYRINGE) ×3 IMPLANT
TRANSDUCER W/STOPCOCK (MISCELLANEOUS) ×3 IMPLANT
TUBING CIL FLEX 10 FLL-RA (TUBING) ×3 IMPLANT
WIRE EMERALD 3MM-J .025X260CM (WIRE) ×3 IMPLANT
WIRE EMERALD 3MM-J .035X260CM (WIRE) ×3 IMPLANT
WIRE HI TORQ VERSACORE-J 145CM (WIRE) ×3 IMPLANT

## 2016-06-14 NOTE — Discharge Instructions (Signed)
Radial Site Care °Refer to this sheet in the next few weeks. These instructions provide you with information about caring for yourself after your procedure. Your health care provider may also give you more specific instructions. Your treatment has been planned according to current medical practices, but problems sometimes occur. Call your health care provider if you have any problems or questions after your procedure. °WHAT TO EXPECT AFTER THE PROCEDURE °After your procedure, it is typical to have the following: °· Bruising at the radial site that usually fades within 1-2 weeks. °· Blood collecting in the tissue (hematoma) that may be painful to the touch. It should usually decrease in size and tenderness within 1-2 weeks. °HOME CARE INSTRUCTIONS °· Take medicines only as directed by your health care provider. °· You may shower 24-48 hours after the procedure or as directed by your health care provider. Remove the bandage (dressing) and gently wash the site with plain soap and water. Pat the area dry with a clean towel. Do not rub the site, because this may cause bleeding. °· Do not take baths, swim, or use a hot tub until your health care provider approves. °· Check your insertion site every day for redness, swelling, or drainage. °· Do not apply powder or lotion to the site. °· Do not flex or bend the affected arm for 24 hours or as directed by your health care provider. °· Do not push or pull heavy objects with the affected arm for 24 hours or as directed by your health care provider. °· Do not lift over 10 lb (4.5 kg) for 5 days after your procedure or as directed by your health care provider. °· Ask your health care provider when it is okay to: °¨ Return to work or school. °¨ Resume usual physical activities or sports. °¨ Resume sexual activity. °· Do not drive home if you are discharged the same day as the procedure. Have someone else drive you. °· You may drive 24 hours after the procedure unless otherwise  instructed by your health care provider. °· Do not operate machinery or power tools for 24 hours after the procedure. °· If your procedure was done as an outpatient procedure, which means that you went home the same day as your procedure, a responsible adult should be with you for the first 24 hours after you arrive home. °· Keep all follow-up visits as directed by your health care provider. This is important. °SEEK MEDICAL CARE IF: °· You have a fever. °· You have chills. °· You have increased bleeding from the radial site. Hold pressure on the site. °SEEK IMMEDIATE MEDICAL CARE IF: °· You have unusual pain at the radial site. °· You have redness, warmth, or swelling at the radial site. °· You have drainage (other than a small amount of blood on the dressing) from the radial site. °· The radial site is bleeding, and the bleeding does not stop after 30 minutes of holding steady pressure on the site. °· Your arm or hand becomes pale, cool, tingly, or numb. °  °This information is not intended to replace advice given to you by your health care provider. Make sure you discuss any questions you have with your health care provider. °  °Document Released: 10/21/2010 Document Revised: 10/09/2014 Document Reviewed: 04/06/2014 °Elsevier Interactive Patient Education ©2016 [employer] ° °

## 2016-06-14 NOTE — H&P (View-Only) (Signed)
HPI: FU atrial fibrillation. Nuclear study 2008 normal. Echocardiogram August 2013 showed normal LV systolic function, mild to moderate aortic stenosis with mean gradient 20 mmHg and mild to moderate aortic insufficiency. Echo repeated 4/17 showed EF 45-50, severe AS with mean gradient 40 mmHg; mild to moderate AI; mild to moderate MR; moderate biatrial enlargement; mild to moderate TR. Carotid dopplers 4/17 showed 1-39 bilateral stenosis. Since last seen, She notes increasing dyspnea on exertion over the last several months. No orthopnea, PND, syncope or chest pain. Occasional palpitations.  Current Outpatient Prescriptions  Medication Sig Dispense Refill  . amoxicillin (AMOXIL) 500 MG capsule Reported on 12/31/2015    . calcium carbonate (OS-CAL) 600 MG TABS Take 1,200 mg by mouth daily.      Marland Kitchen CARTIA XT 240 MG 24 hr capsule TAKE ONE CAPSULE BY MOUTH ONCE DAILY 90 capsule 2  . digoxin (LANOXIN) 0.25 MG tablet Take 1 tablet (0.25 mg total) by mouth daily. 120 tablet 0  . doxycycline (VIBRA-TABS) 100 MG tablet Take 1 tablet (100 mg total) by mouth 2 (two) times daily. 28 tablet 0  . furosemide (LASIX) 40 MG tablet Take ONE tablet by mouth every other day alternating with TWO tablets every other day. 135 tablet 3  . gabapentin (NEURONTIN) 300 MG capsule Take 1 tablet by mouth in the am and 3 tablets by mouth in the pm    . HYDROcodone-acetaminophen (NORCO/VICODIN) 5-325 MG per tablet Take 1 tablet by mouth 3 (three) times daily as needed (for pain).     Marland Kitchen levothyroxine (SYNTHROID, LEVOTHROID) 50 MCG tablet Take 50 mcg by mouth daily.      Marland Kitchen lisinopril (PRINIVIL,ZESTRIL) 2.5 MG tablet Take 2.5 mg by mouth daily.    . metFORMIN (GLUCOPHAGE) 500 MG tablet Take 2 tablets by mouth 2 (two) times daily.    . metoprolol (LOPRESSOR) 100 MG tablet Take 1 tablet (100 mg total) by mouth 2 (two) times daily. 180 tablet 0  . Multiple Vitamins-Minerals (VISION-VITE PRESERVE PO) Take 1 capsule by mouth 2  (two) times daily.    . psyllium (METAMUCIL) 58.6 % powder Take 1 packet by mouth daily.    . simvastatin (ZOCOR) 20 MG tablet TAKE ONE-HALF TABLET BY MOUTH ONCE DAILY 30 tablet 10  . warfarin (COUMADIN) 5 MG tablet TAKE ONE-HALF TO ONE TABLET BY MOUTH ONCE DAILY 30 tablet 3   No current facility-administered medications for this visit.      Past Medical History:  Diagnosis Date  . Diabetes mellitus   . History of peptic ulcer disease   . Hypercholesteremia   . Hyperlipidemia   . Hypertension   . Peripheral neuropathy (HCC)   . Permanent atrial fibrillation (HCC)   . Severe aortic stenosis   . Tachycardia-bradycardia syndrome (HCC)    a. s/p SJM single chamber PPM implanted by Dr Reyes Ivan 2009  . Thrombocytopenia (HCC)   . Thyroid disease     Past Surgical History:  Procedure Laterality Date  . BREAST BIOPSY  02/22/2007    left  . CARDIOVERSION  05/16/2005   Electrical cardioversion   . ESOPHAGOGASTRODUODENOSCOPY  03/12/2007    Esophagogastroduodenoscopy with control of bleeding  . PACEMAKER INSERTION  06/18/2008   SJM Zephyr XL SR implanted by Dr Reyes Ivan  . SHOULDER SURGERY  12/07/2004    Social History   Social History  . Marital status: Married    Spouse name: N/A  . Number of children: N/A  . Years of education: N/A  Occupational History  . Not on file.   Social History Main Topics  . Smoking status: Former Smoker    Types: Cigarettes    Quit date: 03/06/1961  . Smokeless tobacco: Never Used  . Alcohol use No  . Drug use: No  . Sexual activity: Not on file   Other Topics Concern  . Not on file   Social History Narrative  . No narrative on file    Family History  Problem Relation Age of Onset  . Heart disease Father   . Emphysema Father   . Heart failure Father   . Coronary artery disease Father   . Stroke Sister     cerebral hemorrhage  . Breast cancer Sister   . Heart disease Sister   . Heart failure Sister     ROS: no fevers or chills,  productive cough, hemoptysis, dysphasia, odynophagia, melena, hematochezia, dysuria, hematuria, rash, seizure activity, orthopnea, PND, claudication. Remaining systems are negative.  Physical Exam: Well-developed well-nourished in no acute distress.  Skin is warm and dry.  HEENT is normal.  Neck is supple.  Chest is clear to auscultation with normal expansion.  Cardiovascular exam is irregular, 3/6 systolic murmur radiating to the carotids. S2 is diminished. Abdominal exam nontender or distended. No masses palpated. Extremities show trace edema. neuro grossly intact   A/P  1 Aortic stenosis-patient has developed increasing dyspnea on exertion likely secondary to her aortic valve disease. Previous echocardiogram showed severe aortic stenosis and mild to moderate aortic insufficiency. There was also mild to moderate mitral regurgitation. I think it is time to consider aortic valve replacement. We will arrange right and left cardiac catheterization with either Dr. Excell Seltzerooper or Dr. Clifton JamesMcalhany as she would be a good TAVR candidate. The risks and benefits were discussed and she agrees to proceed. These include myocardial infarction, CVA and death. Hold Glucophage the day of her procedure in 48 hours after. Hold Coumadin 5 days prior to procedure and resume the day of. We will arrange follow-up in valve clinic after catheterization.  2 permanent atrial fibrillation-continue present medications for rate control. Continue Coumadin.  3 pacemaker-followed by electrophysiology.  4 hyperlipidemia-continue statin.  5 hypertension-continue present blood pressure medication.  6 cerebrovascular disease-continue statin. No aspirin given need for Coumadin.  Olga MillersBrian Crenshaw, MD

## 2016-06-14 NOTE — Interval H&P Note (Signed)
History and Physical Interval Note:  06/14/2016 11:07 AM  [name]  has presented today for surgery, with the diagnosis of as  The various methods of treatment have been discussed with the patient and family. After consideration of risks, benefits and other options for treatment, the patient has consented to  Procedure(s): Right/Left Heart Cath and Coronary Angiography (N/A) as a surgical intervention .  The patient's history has been reviewed, patient examined, no change in status, stable for surgery.  I have reviewed the patient's chart and labs.  Questions were answered to the patient's satisfaction.     Tonny Bollmanooper, Rahkim Rabalais

## 2016-06-15 ENCOUNTER — Other Ambulatory Visit: Payer: Self-pay | Admitting: *Deleted

## 2016-06-15 ENCOUNTER — Encounter (HOSPITAL_COMMUNITY): Payer: Self-pay | Admitting: Cardiovascular Disease

## 2016-06-15 ENCOUNTER — Encounter: Payer: Self-pay | Admitting: Cardiovascular Disease

## 2016-06-15 DIAGNOSIS — I350 Nonrheumatic aortic (valve) stenosis: Secondary | ICD-10-CM

## 2016-06-15 NOTE — Telephone Encounter (Signed)
This encounter was created in error - please disregard.

## 2016-06-15 NOTE — Telephone Encounter (Signed)
New massage   Pt verbalized that she wants to know if she should keep the appt or not for Dr.Cooper

## 2016-06-16 ENCOUNTER — Institutional Professional Consult (permissible substitution): Payer: Medicare Other | Admitting: Cardiovascular Disease

## 2016-06-19 ENCOUNTER — Institutional Professional Consult (permissible substitution): Payer: Medicare Other | Admitting: Cardiovascular Disease

## 2016-06-21 ENCOUNTER — Encounter: Payer: Self-pay | Admitting: Physical Therapy

## 2016-06-21 ENCOUNTER — Ambulatory Visit: Payer: Medicare Other | Attending: Cardiovascular Disease | Admitting: Physical Therapy

## 2016-06-21 DIAGNOSIS — R293 Abnormal posture: Secondary | ICD-10-CM | POA: Insufficient documentation

## 2016-06-21 DIAGNOSIS — M6281 Muscle weakness (generalized): Secondary | ICD-10-CM | POA: Insufficient documentation

## 2016-06-21 DIAGNOSIS — R2689 Other abnormalities of gait and mobility: Secondary | ICD-10-CM | POA: Diagnosis not present

## 2016-06-21 NOTE — Therapy (Signed)
[facility] [address] [place], [place], [address] Phone: [phone]   Fax:  [phone]  Physical Therapy Evaluation  Patient Details  Name: [name] MRN: [id number] Date of Birth: 11/03/1926 Referring Provider: Dr. Tonny BollmanMichael Cooper  Encounter Date: 06/21/2016      PT End of Session - 06/21/16 1228    Visit Number 1   PT Start Time 1227   PT Stop Time 1314   PT Time Calculation (min) 47 min      Past Medical History:  Diagnosis Date  . Diabetes mellitus   . History of peptic ulcer disease   . Hypercholesteremia   . Hyperlipidemia   . Hypertension   . Peripheral neuropathy (HCC)   . Permanent atrial fibrillation (HCC)   . Severe aortic stenosis   . Tachycardia-bradycardia syndrome (HCC)    a. s/p SJM single chamber PPM implanted by Dr Reyes IvanKersey 2009  . Thrombocytopenia (HCC)   . Thyroid disease     Past Surgical History:  Procedure Laterality Date  . BREAST BIOPSY  02/22/2007    left  . CARDIAC CATHETERIZATION N/A 06/14/2016   Procedure: Left Heart Cath and Coronary Angiography;  Surgeon: Tonny BollmanMichael Cooper, MD;  [facility];  Service: Cardiovascular;  Laterality: N/A;  . CARDIOVERSION  05/16/2005   Electrical cardioversion   . ESOPHAGOGASTRODUODENOSCOPY  03/12/2007    Esophagogastroduodenoscopy with control of bleeding  . PACEMAKER INSERTION  06/18/2008   SJM Zephyr XL SR implanted by Dr Reyes IvanKersey  . SHOULDER SURGERY  12/07/2004    There were no vitals filed for this visit.       Subjective Assessment - 06/21/16 1229    Subjective PT reports exertional shortness of breath in the last several months which has been worsening especially with yardwork although pt also reports some shortness of breath at rest. Denies chest pain or dizziness.    Patient Stated Goals return to yardwork and normal activities   Currently in Pain? No/denies            [facility] - 06/21/16 0001      Assessment   Medical Diagnosis severe aortic stenosis   Referring Provider Dr. Tonny BollmanMichael Cooper   Onset Date/Surgical Date 02/19/16  approximate     Precautions   Precautions None   Precaution Comments except this week no lifting due to catheritization     Restrictions   Weight Bearing Restrictions No     Balance Screen   Has the patient fallen in the past 6 months Yes   How many times? 1-2  outdoors, can get self up   Has the patient had a decrease in activity level because of a fear of falling?  No   Is the patient reluctant to leave their home because of a fear of falling?  No     Home Environment   Living Environment Private residence   Living Arrangements Alone   Home Access Stairs to enter   Entrance Stairs-Number of Steps 1-2   Entrance Stairs-Rails None  holds to door   Home Layout One level     Posture/Postural Control   Posture/Postural Control Postural limitations   Postural Limitations Forward head;Rounded Shoulders     ROM / Strength   AROM / PROM / Strength AROM;Strength     AROM   Overall AROM Comments UE WFL, R shoulder flexion limited by 15-20 degrees     Strength   Overall Strength Comments UE 4+/5 except R  shoulder flexion 4-/5; lower extremities 4+/5 throughout except L DF 4/5   Strength Assessment Site Hand   Right/Left hand Right;Left   Right Hand Grip (lbs) 36  R hand dominant   Left Hand Grip (lbs) 47     Ambulation/Gait   Gait Comments Pt has a narrow BOS and sometimes crosses midline with her steps. She has trouble negotiating in a straight path especially when fatigued.           [facility] Pre-Surgical Assessment - 06/21/16 0001    5 Meter Walk Test- trial 1 5 sec   5 Meter Walk Test- trial 2 5 sec.    5 Meter Walk Test- trial 3 5 sec.  </= 6 sec WNL   5 meter walk test average 5 sec   Timed Up & Go Test trial  11 sec.   Comments </= 12 seconds WNL   4 Stage Balance Test tolerated for:  10 sec.   4 Stage Balance Test Position 2    comment inability to hold position 3 x 10 seconds indicates increased risk   Sit To Stand Test- trial 1 15 sec.   Comment </= 14.8 sec WNL for age/gender   ADL/IADL Independent with: Bathing;Dressing;Meal prep;Finances;Yard work   ADL/IADL Freight forwarder Index Vulnerable   6 Minute Walk- Baseline yes   BP (mmHg) 118/58   HR (bpm) 64   02 Sat (%RA) 96 %   Modified Borg Scale for Dyspnea 0- Nothing at all   Perceived Rate of Exertion (Borg) 6-   6 Minute Walk Post Test yes   BP (mmHg) 142/70   HR (bpm) 87   02 Sat (%RA) 98 %   Modified Borg Scale for Dyspnea 1- Very mild shortness of breath   Perceived Rate of Exertion (Borg) 12-   Aerobic Endurance Distance Walked 1110   Endurance additional comments Pt did not require any rest breaks. Pt did slow down throughout the test and balance declined. Pt's walking distance limited by 14% to age/gender related norm of 1286 feet                                     Plan - 06/21/16 1344    Clinical Impression Statement Pt is an 82 yo female presenting to OP PT for evaluation prior to scheduled TAVR surgery due to severe aortic stenosis. Pt reports onset of exertional shortness of breath approximately 3-4 months ago. Symptoms are not limiting her ability to perform tasks but yardwork is taking longer. She also reports some shortness of breath at rest. Pt presents with good ROM and strength with the exception of R shoulder flexion which has been limited and she plans to follow up with MD after TAVR surgery, fair to good balance and is at high fall risk per 4 stage balance test. Her static balance is more limited than her dynamic due to neuropathy. Pt demonstrated good walking speed and fair aerobic endurance per 6 minute walk test. Pt ambulated 1110 feet in 6 minute walk test. Pt reported 1/10 shortness of breath on modified scale for dyspnea. Pt's BP increased significantly with 6 minute walk test.   PT Frequency One time visit    Consulted and Agree with Plan of Care Patient      Patient demonstrated the following deficits and impairments:     Visit Diagnosis: Other abnormalities of gait and mobility - Plan: PT plan of care  cert/re-cert  Abnormal posture - Plan: PT plan of care cert/re-cert  Muscle weakness (generalized) - Plan: PT plan of care cert/re-cert      G-Codes - 2016/06/22 1347    Functional Assessment Tool Used 6 minute walk 1170' (14% limited)   Functional Limitation Mobility: Walking and moving around   Mobility: Walking and Moving Around Current Status [phone]) At least 1 percent but less than 20 percent impaired, limited or restricted   Mobility: Walking and Moving Around Goal Status [phone]) At least 1 percent but less than 20 percent impaired, limited or restricted   Mobility: Walking and Moving Around Discharge Status [phone]) At least 1 percent but less than 20 percent impaired, limited or restricted       Problem List Patient Active Problem List   Diagnosis Date Noted  . Cerebrovascular disease 12/21/2015  . Pacemaker 12/21/2015  . Encounter for therapeutic drug monitoring 11/24/2013  . Osteoarthritis 11/24/2013  . Left carotid bruit 06/11/2013  . Essential hypertension 03/08/2011  . Type II or unspecified type diabetes mellitus without mention of complication, uncontrolled 03/08/2011  . Severe aortic stenosis 03/08/2011  . HYPERLIPIDEMIA 10/28/2010  . ATRIAL FIBRILLATION, CHRONIC 10/28/2010  . BRADYCARDIA-TACHYCARDIA SYNDROME 10/28/2010    Percell Boston, PT 06/22/16, 1:49 PM  [facility] [address] [place], [place], [address] Phone: [phone]   Fax:  [phone]  Name: [name] MRN: [id number] Date of Birth: [birth year]

## 2016-06-23 ENCOUNTER — Ambulatory Visit (HOSPITAL_COMMUNITY)
Admission: RE | Admit: 2016-06-23 | Discharge: 2016-06-23 | Disposition: A | Discharge status: Home / Self Care | Payer: Medicare Other | Source: Ambulatory Visit | Attending: Cardiovascular Disease | Admitting: Cardiovascular Disease

## 2016-06-23 ENCOUNTER — Ambulatory Visit (INDEPENDENT_AMBULATORY_CARE_PROVIDER_SITE_OTHER): Payer: Medicare Other | Admitting: Pharmacist

## 2016-06-23 ENCOUNTER — Encounter (HOSPITAL_COMMUNITY): Payer: Self-pay

## 2016-06-23 DIAGNOSIS — Z87891 Personal history of nicotine dependence: Secondary | ICD-10-CM | POA: Insufficient documentation

## 2016-06-23 DIAGNOSIS — Z5181 Encounter for therapeutic drug level monitoring: Secondary | ICD-10-CM

## 2016-06-23 DIAGNOSIS — I358 Other nonrheumatic aortic valve disorders: Secondary | ICD-10-CM | POA: Diagnosis not present

## 2016-06-23 DIAGNOSIS — J449 Chronic obstructive pulmonary disease, unspecified: Secondary | ICD-10-CM | POA: Diagnosis not present

## 2016-06-23 DIAGNOSIS — I350 Nonrheumatic aortic (valve) stenosis: Secondary | ICD-10-CM | POA: Insufficient documentation

## 2016-06-23 DIAGNOSIS — R942 Abnormal results of pulmonary function studies: Secondary | ICD-10-CM | POA: Diagnosis not present

## 2016-06-23 DIAGNOSIS — I517 Cardiomegaly: Secondary | ICD-10-CM | POA: Diagnosis not present

## 2016-06-23 LAB — PULMONARY FUNCTION TEST
DL/VA % PRED: 72 %
DL/VA: 3.66 ml/min/mmHg/L
DLCO unc % pred: 51 %
DLCO unc: 13.92 ml/min/mmHg
FEF 25-75 POST: 1.07 L/s
FEF 25-75 Pre: 0.71 L/sec
FEF2575-%CHANGE-POST: 50 %
FEF2575-%PRED-PRE: 61 %
FEF2575-%Pred-Post: 92 %
FEV1-%CHANGE-POST: 13 %
FEV1-%PRED-PRE: 69 %
FEV1-%Pred-Post: 78 %
FEV1-PRE: 1.3 L
FEV1-Post: 1.48 L
FEV1FVC-%CHANGE-POST: 7 %
FEV1FVC-%PRED-PRE: 88 %
FEV6-%Change-Post: 8 %
FEV6-%PRED-PRE: 82 %
FEV6-%Pred-Post: 89 %
FEV6-POST: 2.14 L
FEV6-Pre: 1.96 L
FEV6FVC-%Change-Post: 2 %
FEV6FVC-%PRED-POST: 105 %
FEV6FVC-%Pred-Pre: 102 %
FVC-%CHANGE-POST: 5 %
FVC-%PRED-POST: 84 %
FVC-%PRED-PRE: 80 %
FVC-POST: 2.16 L
FVC-PRE: 2.04 L
PRE FEV1/FVC RATIO: 64 %
PRE FEV6/FVC RATIO: 96 %
Post FEV1/FVC ratio: 69 %
Post FEV6/FVC ratio: 99 %
RV % pred: 100 %
RV: 2.64 L
TLC % pred: 85 %
TLC: 4.58 L

## 2016-06-23 LAB — POCT INR: INR: 3.2

## 2016-06-23 MED ORDER — IOPAMIDOL (ISOVUE-370) INJECTION 76%
INTRAVENOUS | Status: AC
  Administered 2016-06-23: 70 mL
  Filled 2016-06-23: qty 100

## 2016-06-23 MED ORDER — ALBUTEROL SULFATE (2.5 MG/3ML) 0.083% IN NEBU
2.5000 mg | INHALATION_SOLUTION | Freq: Once | RESPIRATORY_TRACT | Status: AC
  Administered 2016-06-23: 2.5 mg via RESPIRATORY_TRACT

## 2016-06-23 MED ORDER — IOPAMIDOL (ISOVUE-370) INJECTION 76%
INTRAVENOUS | Status: AC
  Filled 2016-06-23: qty 50

## 2016-06-23 MED ORDER — IOPAMIDOL (ISOVUE-370) INJECTION 76%
80.0000 mL | Freq: Once | INTRAVENOUS | Status: AC | PRN
  Administered 2016-06-23: 80 mL via INTRAVENOUS

## 2016-06-26 ENCOUNTER — Institutional Professional Consult (permissible substitution) (INDEPENDENT_AMBULATORY_CARE_PROVIDER_SITE_OTHER): Payer: Medicare Other | Admitting: Thoracic Surgery (Cardiothoracic Vascular Surgery)

## 2016-06-26 ENCOUNTER — Encounter: Payer: Self-pay | Admitting: Thoracic Surgery (Cardiothoracic Vascular Surgery)

## 2016-06-26 VITALS — BP 124/65 | HR 62 | Resp 16 | Ht 66.0 in | Wt 137.0 lb

## 2016-06-26 DIAGNOSIS — I350 Nonrheumatic aortic (valve) stenosis: Secondary | ICD-10-CM | POA: Diagnosis not present

## 2016-06-26 DIAGNOSIS — I6523 Occlusion and stenosis of bilateral carotid arteries: Secondary | ICD-10-CM | POA: Diagnosis not present

## 2016-06-26 DIAGNOSIS — I5032 Chronic diastolic (congestive) heart failure: Secondary | ICD-10-CM | POA: Insufficient documentation

## 2016-06-26 NOTE — Progress Notes (Addendum)
[facility]  MULTIDISCIPLINARY HEART VALVE CLINIC  CARDIOTHORACIC SURGERY CONSULTATION REPORT  Referring Provider is Crenshaw, Madolyn FriezeBrian S, MD PCP is ROBERTS, Vernie AmmonsONALD WAYNE, MD  Chief Complaint  Patient presents with  . Aortic Stenosis    SEVERE...EVAL FOR TAVR    HPI:  Patient is an 82 year old female with history of aortic stenosis, chronic persistent atrial fibrillation on warfarin anticoagulation, tachybradycardia syndrome status post single-chamber permanent pacemaker placement, hypertension, hyperlipidemia, and type 2 diabetes mellitus with complications including peripheral neuropathy who has been referred for surgical consultation to discuss treatment options for management of stage D severe symptomatic aortic stenosis. The patient's cardiac history dates back yearly 15 years ago when she first developed atrial fibrillation. She has been chronically anticoagulated using warfarin.  She was followed for many years by Dr. Patty SermonsBrackbill and more recently by Dr. Jens Somrenshaw.  Serial echocardiograms have documented the presence of normal left ventricular systolic function with aortic stenosis that has gradually progressed in severity. Most recent echocardiogram performed 01/07/2016 revealed the presence of severe aortic stenosis with peak velocity across the aortic valve measured as high as 5.0 m/s corresponding to mean transvalvular gradient estimated 40 mmHg.  Left ventricular systolic function was felt to be mildly reduced with ejection fraction estimated 45-50%. There was moderate concentric left ventricular hypertrophy with significant diastolic dysfunction.  The patient was also noted to have mild to moderate regurgitation. Over the past several months the patient has developed symptoms of exertional shortness of breath. She was seen in follow-up by Dr. Jens Somrenshaw and referred to Dr. Excell Seltzerooper who performed left heart catheterization on 06/14/2016. The patient was noted to have widely patent  coronary arteries with no angiographically significant coronary artery disease. There was severe aortic stenosis with mean transvalvular gradient measured 35 mmHg at catheterization.  Left ventricular systolic function appeared normal and there was only mild mitral regurgitation. Right heart catheterization was not performed.  The patient subsequently underwent CT angiography and has been referred for surgical consultation.  The patient is widowed and lives alone locally in [place].  She does not have any children although she has a niece who is very supportive and she is accompanied to the office today for consultation by a close friend who is a retired Designer, jewelleryregistered nurse. The patient's husband underwent bypass surgery in 1998. The patient has remained remarkably active physically all of her life. She lives alone, drives an automobile, and remains entirely functionally independent. She enjoys gardening. Over the last several months she has developed symptoms of exertional shortness of breath. She states that this does not limit her activities to a significant degree, but she has been getting more short of breath. She got short of breath just driving her car to the office today. She denies any history of chest pain or chest tightness either with activity or at rest. She denies any history of resting shortness of breath. She does feel more short of breath and tied across her chest when she lays flat in bed. She has palpitations and a long-standing history of atrial fibrillation. She has a long history of lower extremity edema, left greater than right. She denies any history of dizzy spells or near syncope. She has intermittent dry nonproductive cough.  Past Medical History:  Diagnosis Date  . Chronic diastolic congestive heart failure (HCC)   . Diabetes mellitus   . History of peptic ulcer disease   . Hypercholesteremia   . Hyperlipidemia   . Hypertension   . Peripheral neuropathy (HCC)   .  Permanent  atrial fibrillation (HCC)   . Severe aortic stenosis   . Tachycardia-bradycardia syndrome (HCC)    a. s/p SJM single chamber PPM implanted by Dr Reyes Ivan 2009  . Thrombocytopenia (HCC)   . Thyroid disease     Past Surgical History:  Procedure Laterality Date  . BREAST BIOPSY  02/22/2007    left  . CARDIAC CATHETERIZATION N/A 06/14/2016   Procedure: Left Heart Cath and Coronary Angiography;  Surgeon: Tonny Bollman, MD;  Location: [facility] INVASIVE CV LAB;  Service: Cardiovascular;  Laterality: N/A;  . CARDIOVERSION  05/16/2005   Electrical cardioversion   . ESOPHAGOGASTRODUODENOSCOPY  03/12/2007    Esophagogastroduodenoscopy with control of bleeding  . PACEMAKER INSERTION  06/18/2008   SJM Zephyr XL SR implanted by Dr Reyes Ivan  . SHOULDER SURGERY  12/07/2004    Family History  Problem Relation Age of Onset  . Heart disease Father   . Emphysema Father   . Heart failure Father   . Coronary artery disease Father   . Stroke Sister     cerebral hemorrhage  . Breast cancer Sister   . Heart disease Sister   . Heart failure Sister     Social History   Social History  . Marital status: Married    Spouse name: N/A  . Number of children: N/A  . Years of education: N/A   Occupational History  . Not on file.   Social History Main Topics  . Smoking status: Former Smoker    Packs/day: 1.00    Years: 10.00    Types: Cigarettes    Quit date: 03/06/1961  . Smokeless tobacco: Never Used  . Alcohol use No  . Drug use: No  . Sexual activity: Not on file   Other Topics Concern  . Not on file   Social History Narrative  . No narrative on file    Current Outpatient Prescriptions  Medication Sig Dispense Refill  . amoxicillin (AMOXIL) 500 MG capsule Take 500 mg by mouth once. Reported on 12/31/2015    . calcium carbonate (OS-CAL) 600 MG TABS Take 1,200 mg by mouth. THREE DAYS A WEEK    . digoxin (LANOXIN) 0.25 MG tablet Take 1 tablet (0.25 mg total) by mouth daily. 120 tablet 0  .  diltiazem (CARTIA XT) 240 MG 24 hr capsule Take 1 capsule (240 mg total) by mouth daily. 90 capsule 3  . doxycycline (VIBRA-TABS) 100 MG tablet Take 1 tablet (100 mg total) by mouth 2 (two) times daily. 28 tablet 0  . furosemide (LASIX) 40 MG tablet Take ONE tablet by mouth every other day alternating with TWO tablets every other day. (Patient taking differently: Take 40-80 mg by mouth See admin instructions. Takes 40 mg by mouth every other day alternating with 80 mg the other days.) 135 tablet 3  . gabapentin (NEURONTIN) 300 MG capsule Take 300-900 mg by mouth See admin instructions. Take 1 tablet by mouth in the am and 3 tablets by mouth in the pm    . levothyroxine (SYNTHROID, LEVOTHROID) 50 MCG tablet Take 50 mcg by mouth daily.      Marland Kitchen lisinopril (PRINIVIL,ZESTRIL) 2.5 MG tablet Take 2.5 mg by mouth daily.    . metFORMIN (GLUCOPHAGE) 500 MG tablet Take 2 tablets by mouth 2 (two) times daily.    . metoprolol (LOPRESSOR) 100 MG tablet Take 1 tablet (100 mg total) by mouth 2 (two) times daily. 180 tablet 0  . Multiple Vitamins-Minerals (VISION-VITE PRESERVE PO) Take 1 capsule by mouth  2 (two) times daily.    . psyllium (METAMUCIL) 58.6 % powder Take 1 packet by mouth daily.    . simvastatin (ZOCOR) 20 MG tablet TAKE ONE-HALF TABLET BY MOUTH ONCE DAILY 30 tablet 10  . warfarin (COUMADIN) 5 MG tablet TAKE ONE-HALF TO ONE TABLET BY MOUTH ONCE DAILY (Patient taking differently: Take 2.5 mg by mouth one time only at 6 PM. OR AS DIRECTED) 30 tablet 3   No current facility-administered medications for this visit.     Allergies  Allergen Reactions  . Aspirin Other (See Comments)    Gi bleeding  . Codeine     GI Lead  . Amiodarone Other (See Comments)    Stops her heart  . Lipitor [Atorvastatin Calcium]     Muscle Ache  . Pravachol     Muscle Ache      Review of Systems:   General:  normal appetite, normal energy, no weight gain, no weight loss, no fever  Cardiac:  no chest pain with  exertion, no chest pain at rest, + SOB with exertion, no resting SOB, no PND, + orthopnea, + palpitations, + arrhythmia, + atrial fibrillation, + LE edema, no dizzy spells, no syncope  Respiratory:  + shortness of breath, no home oxygen, no productive cough, intermittent dry cough, no bronchitis, no wheezing, no hemoptysis, no asthma, no pain with inspiration or cough, no sleep apnea, no CPAP at night  GI:   no difficulty swallowing, no reflux, no frequent heartburn, no hiatal hernia, no abdominal pain, no constipation, no diarrhea, no hematochezia, no hematemesis, no melena  GU:   no dysuria,  no frequency, no urinary tract infection, no hematuria, no kidney stones, no kidney disease  Vascular:  no pain suggestive of claudication, + pain in feet, no leg cramps, + varicose veins, no DVT, no non-healing foot ulcer  Neuro:   no stroke, no TIA's, no seizures, no headaches, no temporary blindness one eye,  no slurred speech, + peripheral neuropathy, no chronic pain, mild instability of gait, no memory/cognitive dysfunction  Musculoskeletal: mild arthritis, no joint swelling, no myalgias, no difficulty walking, normal mobility   Skin:   no rash, no itching, no skin infections, no pressure sores or ulcerations  Psych:   no anxiety, no depression, no nervousness, no unusual recent stress  Eyes:   no blurry vision, no floaters, no recent vision changes, + wears glasses or contacts  ENT:   no hearing loss, no loose or painful teeth, no dentures, last saw dentist August 2017  Hematologic:  + easy bruising, no abnormal bleeding, no clotting disorder, no frequent epistaxis  Endocrine:  + diabetes, does check CBG's at home           Physical Exam:   BP 124/65   Pulse 62   Resp 16   Ht 5\' 6"  (1.676 m)   Wt 137 lb (62.1 kg)   SpO2 98% Comment: ON RA  BMI 22.11 kg/m   General:    well-appearing  HEENT:  Unremarkable   Neck:   no JVD, no bruits, no adenopathy   Chest:   clear to auscultation,  symmetrical breath sounds, no wheezes, no rhonchi   CV:   Irregular rate and rhythm, grade IV/VI crescendo/decrescendo murmur heard best at LLSB,  no diastolic murmur  Abdomen:  soft, non-tender, no masses   Extremities:  warm, well-perfused, pulses palpable, + LE edema L>R, varicose veins w/ chronic skin changes c/w venous insufficiency  Rectal/GU  Deferred  Neuro:  Grossly non-focal and symmetrical throughout  Skin:   Clean and dry, no rashes, no breakdown   Diagnostic Tests:  Transthoracic Echocardiography  Patient:    Jerrine, Urschel MR #:       [id number] Study Date: 01/07/2016 Gender:     F Age:        82 Height:     167.6 cm Weight:     66.7 kg BSA:        1.77 m^2 Pt. Status: Room:   Evorn Gong Crenshaw  REFERRING    [name]  SONOGRAPHER  [address] RVT, RDCS  ATTENDING    Zoila Shutter MD  PERFORMING   [facility], Outpatient  cc:  ------------------------------------------------------------------- LV EF: 45% -   50%  ------------------------------------------------------------------- History:   PMH:  Acquired from the patient and from the patient&'s chart.  Atrial fibrillation.  Risk factors:  Former tobacco use. Hypertension. Diabetes mellitus.  ------------------------------------------------------------------- Study Conclusions  - Left ventricle: The cavity size was normal. There was moderate   concentric hypertrophy. Systolic function was mildly reduced. The   estimated ejection fraction was in the range of 45% to 50%. Mild   diffuse hypokinesis with no identifiable regional variations. - Aortic valve: Valve mobility was severely restricted. There was   severe stenosis. There was mild to moderate regurgitation   directed centrally in the LVOT. Valve area (VTI): 0.9 cm^2. - Mitral valve: Calcified annulus. Moderately thickened, mildly   calcified leaflets . There was mild to moderate regurgitation   directed centrally. Valve area by  continuity equation (using LVOT   flow): 1.93 cm^2. - Left atrium: The atrium was moderately dilated. - Right ventricle: Systolic function was mildly reduced. - Right atrium: The atrium was moderately dilated. - Tricuspid valve: There was mild-moderate regurgitation directed   centrally. - Pulmonary arteries: Systolic pressure was mildly increased. PA   peak pressure: 37 mm Hg (S).  Transthoracic echocardiography.  M-mode, complete 2D, spectral Doppler, and color Doppler.  Birthdate:  Patient birthdate: [birth year].  Age:  Patient is 82 yr old.  Sex:  Gender: female. BMI: 23.7 kg/m^2.  Blood pressure:     106/70  Patient status: Outpatient.  Study date:  Study date: 01/07/2016. Study time: 11:15 AM.  -------------------------------------------------------------------  ------------------------------------------------------------------- Left ventricle:  The cavity size was normal. There was moderate concentric hypertrophy. Systolic function was mildly reduced. The estimated ejection fraction was in the range of 45% to 50%.  Mild diffuse hypokinesis with no identifiable regional variations. The study was not technically sufficient to allow evaluation of LV diastolic dysfunction due to atrial fibrillation.  ------------------------------------------------------------------- Aortic valve:   Trileaflet; severely thickened, severely calcified leaflets. Valve mobility was severely restricted.  Doppler:   There was severe stenosis.   There was mild to moderate regurgitation directed centrally in the LVOT.    VTI ratio of LVOT to aortic valve: 0.3. Valve area (VTI): 0.9 cm^2. Indexed valve area (VTI): 0.51 cm^2/m^2. Peak velocity ratio of LVOT to aortic valve: 0.26. Indexed valve area (Vmax): 0.43 cm^2/m^2. Mean velocity ratio of LVOT to aortic valve: 0.3. Indexed valve area (Vmean): 0.5 cm^2/m^2.    Mean gradient (S): 40 mm Hg. Peak gradient (S): 100  mm Hg.  ------------------------------------------------------------------- Aorta:  Aortic root: The aortic root was normal in size. Ascending aorta: The ascending aorta was normal in size.  ------------------------------------------------------------------- Mitral valve:   Calcified annulus. Moderately thickened, mildly calcified leaflets .  Doppler:   There was no evidence for stenosis.  There was mild to moderate regurgitation directed centrally.    Valve area by continuity equation (using LVOT flow): 1.93 cm^2. Indexed valve area by continuity equation (using LVOT flow): 1.09 cm^2/m^2.    Mean gradient (D): 3 mm Hg. Peak gradient (D): 13 mm Hg.  ------------------------------------------------------------------- Left atrium:  The atrium was moderately dilated.  ------------------------------------------------------------------- Right ventricle:  The cavity size was normal. Systolic function was mildly reduced.  ------------------------------------------------------------------- Pulmonic valve:    Structurally normal valve.   Cusp separation was normal.  Doppler:  Transvalvular velocity was within the normal range. There was mild regurgitation.  ------------------------------------------------------------------- Tricuspid valve:   Structurally normal valve.   Leaflet separation was normal.  Doppler:  Transvalvular velocity was within the normal range. There was mild-moderate regurgitation directed centrally.   ------------------------------------------------------------------- Pulmonary artery:   Systolic pressure was mildly increased.  ------------------------------------------------------------------- Right atrium:  The atrium was moderately dilated.  ------------------------------------------------------------------- Pericardium:  There was no pericardial effusion.  ------------------------------------------------------------------- Measurements   Left  ventricle                            Value          Reference  LV ID, ED, PLAX chordal                   47.78 mm       43 - 52  LV ID, ES, PLAX chordal           (H)     38.16 mm       23 - 38  LV fx shortening, PLAX chordal    (L)     20    %        >=29  LV PW thickness, ED                       11.38 mm       ---------  IVS/LV PW ratio, ED                       1.05           <=1.3  Stroke volume, 2D                         65    ml       ---------  Stroke volume/bsa, 2D                     37    ml/m^2   ---------  LV filling time, D, DP                    680   ms       ---------  LV e&', lateral                            4.68  cm/s     ---------  LV E/e&', lateral                          38.89          ---------  LV e&', medial                             4.16  cm/s     ---------  LV E/e&', medial                           43.75          ---------  LV e&', average                            4.42  cm/s     ---------  LV E/e&', average                          41.18          ---------    Ventricular septum                        Value          Reference  IVS thickness, ED                         11.99 mm       ---------    LVOT                                      Value          Reference  LVOT ID, S                                19.4  mm       ---------  LVOT area                                 2.96  cm^2     ---------  LVOT peak velocity, S                     130   cm/s     ---------  LVOT mean velocity, S                     86.2  cm/s     ---------  LVOT VTI, S                               28.5  cm       ---------  LVOT peak gradient, S                     7     mm Hg    ---------  Stroke volume (SV), LVOT DP               84.2  ml       ---------  Stroke index (SV/bsa), LVOT DP            47.6  ml/m^2   ---------    Aortic valve                              Value          Reference  Aortic valve peak velocity, S  501   cm/s     ---------  Aortic valve mean  velocity, S             289   cm/s     ---------  Aortic valve VTI, S                       93.9  cm       ---------  Aortic mean gradient, S                   40    mm Hg    ---------  Aortic peak gradient, S                   100   mm Hg    ---------  VTI ratio, LVOT/AV                        0.3            ---------  Aortic valve area, VTI                    0.9   cm^2     ---------  Aortic valve area/bsa, VTI                0.51  cm^2/m^2 ---------  Velocity ratio, peak, LVOT/AV             0.26           ---------  Aortic valve area/bsa, peak               0.43  cm^2/m^2 ---------  velocity  Velocity ratio, mean, LVOT/AV             0.3            ---------  Aortic valve area/bsa, mean               0.5   cm^2/m^2 ---------  velocity  Aortic regurg pressure half-time          434   ms       ---------    Aorta                                     Value          Reference  Aortic root ID, ED                        31    mm       ---------    Left atrium                               Value          Reference  LA ID, A-P, ES                            46    mm       ---------  LA ID/bsa, A-P                    (H)     2.6   cm/m^2   <=2.2  LA volume, S  102   ml       ---------  LA volume/bsa, S                          57.7  ml/m^2   ---------  LA volume, ES, 1-p A4C                    112   ml       ---------  LA volume/bsa, ES, 1-p A4C                63.3  ml/m^2   ---------  LA volume, ES, 1-p A2C                    88    ml       ---------  LA volume/bsa, ES, 1-p A2C                49.8  ml/m^2   ---------    Mitral valve                              Value          Reference  Mitral E-wave peak velocity               182   cm/s     ---------  Mitral mean velocity, D                   65.01 cm/s     ---------  Mitral deceleration time          (L)     148   ms       150 - 230  Mitral mean gradient, D                   3     mm Hg    ---------  Mitral  peak gradient, D                   13    mm Hg    ---------  Mitral valve area, LVOT                   1.93  cm^2     ---------  continuity  Mitral valve area/bsa, LVOT               1.09  cm^2/m^2 ---------  continuity  Mitral annulus VTI, D                     45.7  cm       ---------  Mitral maximal regurg velocity,           640   cm/s     ---------  PISA  Mitral regurg VTI, PISA                   215   cm       ---------  Mitral ERO, PISA                          0.15  cm^2     ---------  Mitral regurg volume, PISA                32    ml       ---------  Mitral regurg  fraction, PISA              27.53 %        ---------    Pulmonary arteries                        Value          Reference  PA pressure, S, DP                (H)     37    mm Hg    <=30    Tricuspid valve                           Value          Reference  Tricuspid regurg peak velocity            271   cm/s     ---------  Tricuspid peak RV-RA gradient             29    mm Hg    ---------    Systemic veins                            Value          Reference  Estimated CVP                             8     mm Hg    ---------    Right ventricle                           Value          Reference  RV pressure, S, DP                (H)     37    mm Hg    <=30    Pulmonic valve                            Value          Reference  Pulmonic valve peak velocity, S           64.3  cm/s     ---------  Pulmonic regurg velocity, ED              82.9  cm/s     ---------  Legend: (L)  and  (H)  mark values outside specified reference range.  ------------------------------------------------------------------- Prepared and Electronically Authenticated by  Thurmon Fair, MD 2017-04-14T14:05:24   Left Heart Cath and Coronary Angiography  Conclusion     The left ventricular systolic function is normal.  The left ventricular ejection fraction is 55-65% by visual estimate.  There is mild (2+) mitral  regurgitation.  There is severe aortic valve stenosis. There is trivial (1+) aortic regurgitation.   1. Widely patent coronary arteries with no angiographic disease in the LAD, left main, or right coronary artery and minimal irregularity in the left circumflex 2. Calcified aortic valve with restricted leaflet motion and severe aortic stenosis with a mean gradient of 35 mmHg 3. Normal LV systolic function with mild mitral regurgitation by ventriculography 4. Normal aortic root without evidence of aneurysm, estimated angle for valve deployment LAO 5, caudal  5 5. Aborted right heart catheterization (estimated PASP by echo 37 mmHg)  Cardiac surgical evaluation for further assessment of treatment options for severe symptomatic aortic stenosis.  If CTA studies indicate suitable anatomy, pt likely will be a good candidate for TAVR.  Resume warfarin today   Indications   Severe aortic stenosis [I35.0 (ICD-10-CM)]  Procedural Details/Technique   Technical Details INDICATION: Severe symptomatic aortic stenosis  PROCEDURAL DETAILS: There was an indwelling IV in a right antecubital vein. Using normal sterile technique, the IV was changed out for a 5 Fr brachial sheath over a 0.018 inch wire. The right wrist was then prepped, draped, and anesthetized with 1% lidocaine. Using the modified Seldinger technique a 5/6 French Slender sheath was placed in the right radial artery. Intra-arterial verapamil was administered through the radial artery sheath. IV heparin was administered after a JR4 catheter was advanced into the central aorta. I tried to advance a Swan-Ganz catheter from the right antecubital vein. I could advance it to the innominate vein but no further than that. I was able to advance a 0.025 inch guidewire into the right atrium without difficulty, but the catheter would not track over it. The right heart catheterization was eventually aborted. Standard Judkins catheters were used for selective  coronary angiography, aortic root angiography, and left ventriculography. There were no immediate procedural complications. The patient was transferred to the post catheterization recovery area for further monitoring.  During this procedure the patient is administered a total of Versed 1 mg and Fentanyl 25 mg to achieve and maintain moderate conscious sedation. The patient's heart rate, blood pressure, and oxygen saturation are monitored continuously during the procedure. The period of conscious sedation is 26 minutes, of which I was present face-to-face 100% of this time.   Estimated blood loss <50 mL. .    Coronary Findings   Dominance: Right  Left Anterior Descending  Vessel is angiographically normal.  Left Circumflex  The vessel exhibits minimal luminal irregularities.  Right Coronary Artery  Vessel is angiographically normal.  Wall Motion              Left Heart   Left Ventricle The left ventricular size is normal. The left ventricular systolic function is normal. The left ventricular ejection fraction is 55-65% by visual estimate. No regional wall motion abnormalities. There is mild (2+) mitral regurgitation.    Aortic Valve There is severe aortic valve stenosis. There is trivial (1+) aortic regurgitation. The aortic valve is calcified. There is restricted aortic valve motion. The aortic valve is calcified with restricted motion. The mean transvalvular gradient is 35 mmHg. There is 1+ aortic insufficiency    Coronary Diagrams   Diagnostic Diagram     Implants     No implant documentation for this case.  PACS Images   Show images for Cardiac catheterization   Link to Procedure Log   Procedure Log    Hemo Data   Flowsheet Row Most Recent Value  Aortic Mean Gradient 35.4 mmHg  Aortic Peak Gradient 44 mmHg  AO Systolic Pressure 125 mmHg  AO Diastolic Pressure 50 mmHg  AO Mean 78 mmHg  LV Systolic Pressure 159 mmHg  LV Diastolic Pressure 4 mmHg  LV EDP 11 mmHg   Arterial Occlusion Pressure Extended Systolic Pressure 123 mmHg  Arterial Occlusion Pressure Extended Diastolic Pressure 50 mmHg  Arterial Occlusion Pressure Extended Mean Pressure 79 mmHg  Left Ventricular Apex Extended Systolic Pressure 167 mmHg  Left Ventricular Apex Extended Diastolic Pressure 5 mmHg  Left Ventricular Apex Extended EDP Pressure 14 mmHg     Cardiac TAVR CT  TECHNIQUE: The patient was scanned on a [employer] 256 scanner. A 120 kV retrospective scan was triggered in the descending thoracic aorta at 111 HU's. Gantry rotation speed was 270 msecs and collimation was .9 mm. No beta blockade or nitro were given. The 3D data set was reconstructed in 5% intervals of the R-R cycle. Systolic and diastolic phases were analyzed on a dedicated work station using MPR, MIP and VRT modes. The patient received 80 cc of contrast.  Medications:  None  FINDINGS: Aortic Valve: Calcified trileaflet. Nodular calcification of annulus at base of left and non coronary cusps.  Aorta: Mild calcification of the inferior surface of the root, and arch. Normal arch vessel take off  Sinotubular Junction:  28 mm  Ascending Thoracic Aorta:  32 mm  Aortic Arch:  26 mm  Descending Thoracic Aorta:  22 mm  Sinus of Valsalva Measurements:  Non-coronary:  31 mm  Right -coronary:  32 mm  Left -coronary:  32 mm  Coronary Artery Height above Annulus:  Left Main:  16 mm  Right Coronary:  16.6 mm  Virtual Basal Annulus Measurements:  Maximum/Minimum Diameter:  20 mm x 26.4 mm  Perimeter:  79 mm  Area:  440 mm2  Coronary Arteries:  Sufficient height above annulus for deployment  Optimum Fluoroscopic Angle for Delivery:  LAO 13 degrees  IMPRESSION: 1) Calcified trileaflet aortic valve with an arnnular area of 440 mm2 suitable for a 26 mm Sapien 3 valve  2) Nodular calcification of annulus at base of non and left cusps  3) Suitable coronary height for  deployment  4) Optimum angiographic angle for deployment LAO 13 degrees  5) Severe biatrial enlargement with 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   On: 06/23/2016 10:07    STS Risk Calculator  Procedure    AVR  Risk of Mortality   5.6% Morbidity or Mortality  22.0% Prolonged LOS   11.5% Short LOS    18.1% Permanent Stroke   3.0% Prolonged Vent Support  15.0% DSW Infection    0.2% Renal Failure    6.9% Reoperation    8.0%    Impression:  Patient has stage D severe symptomatic aortic stenosis. She presents with recent onset symptoms of exertional shortness of breath consistent with chronic diastolic congestive heart failure, [place] Heart Association functional class II. I have personally reviewed the patient's most recent transthoracic echocardiogram, diagnostic cardiac catheterization, and CT angiograms. Echocardiogram performed in April of this year demonstrates presence of severe aortic stenosis. The aortic valve is trileaflet with severe calcification, thickening, and restricted leaflet mobility involving all 3 leaflets of the valve. Peak velocity across the aortic valve measures as high as 5.0 m/s corresponding to mean transvalvular gradient greater than 40 mmHg. Left ventricular systolic function remains preserved. There was mild to moderate mitral regurgitation. Diagnostic cardiac catheterization is notable for the absence of significant coronary artery disease and confirms the presence of severe aortic stenosis. I agree the patient would benefit from aortic valve replacement. Risks associated with conventional surgery would be at least moderately elevated because of this patient's advanced age. Because of the patient's age and the fact that she lives alone and remains functionally independent, recovery following TAVR would likely be much less risky and complicated. Cardiac gated CT angiogram  of the heart demonstrates  findings consistent with severe aortic stenosis with anatomical characteristics suitable for transcatheter aortic valve replacement using a 26 mm Edwards Sapien 3 transcatheter heart valve yes without any significant, complicating features. There is some nodular calcification in the aortic annulus that could slightly increase the risk of perivalvular leak, but the degree of calcification is not severe.  There is adequate coronary height. Question was raised during this test regarding the possibility of clot within the tip of the left atrial appendage.  I have personally reviewed these findings and agree there is a small chance that there may be clot towards the distal tip of the appendage, but there is clearly not a large thrombus and this may be artifact or related to septations in the appendage itself. CT angiogram of the aorta and iliac vessels has not been officially read by radiologist. However, the patient appears to have adequate pelvic vascular access to facilitate transfemoral approach for TAVR.   Plan:  The patient and her friend were counseled at length regarding treatment alternatives for management of severe symptomatic aortic stenosis. Alternative approaches such as conventional aortic valve replacement, transcatheter aortic valve replacement, and palliative medical therapy were compared and contrasted at length.  The risks associated with conventional surgical aortic valve replacement were been discussed in detail, as were expectations for post-operative convalescence. Long-term prognosis with medical therapy was discussed. This discussion was placed in the context of the patient's own specific clinical presentation and past medical history.  All of their questions been addressed.  We discussed the implications of the findings noted on recent cardiac gated CT angiogram of the heart in regards to possible clot in the left atrial appendage. Alternative strategies have been  discussed including continued anticoagulation using warfarin with delay of plans for TAVR versus proceeding with TAVR in the near future with plans for transesophageal echocardiogram prior to surgery.  The patient is interested in proceeding with transcatheter aortic valve replacement as soon as possible as an alternative to conventional surgical aortic valve replacement.    She understands that we will perform TEE first and possibly delay TAVR if there is clear evidence for clot in the left atrial appendage.  Following the decision to proceed with transcatheter aortic valve replacement, a discussion has been held regarding what types of management strategies would be attempted intraoperatively in the event of life-threatening complications, including whether or not the patient would be considered a candidate for the use of cardiopulmonary bypass and/or conversion to open sternotomy for attempted surgical intervention.  The patient has been advised of a variety of complications that might develop including but not limited to risks of death, stroke, paravalvular leak, aortic dissection or other major vascular complications, aortic annulus rupture, device embolization, cardiac rupture or perforation, mitral regurgitation, acute myocardial infarction, arrhythmia, heart block or bradycardia requiring permanent pacemaker placement, congestive heart failure, respiratory failure, renal failure, pneumonia, infection, other late complications related to structural valve deterioration or migration, or other complications that might ultimately cause a temporary or permanent loss of functional independence or other long term morbidity.  The patient provides full informed consent for the procedure as described and all questions were answered.  The patient has already been instructed to stop taking warfarin on Wednesday, 06/28/2016 in anticipation of surgery next week. We will begin daily Lovenox injections on Thursday,  06/29/2016 to serve as a bridge between now and the time of surgery.  Expectations for the patient's postoperative convalescence have been discussed. In particular, because the patient lives alone she  will make arrangements for her niece and other friends or family to stay with her 24 hours a day once she is to be discharged from the hospital.  She is interested in participating in the outpatient cardiac rehabilitation program once she has recovered enough and is allowed to resume driving an automobile.   I spent in excess of 90 minutes during the conduct of this office consultation and >50% of this time involved direct face-to-face encounter with the patient for counseling and/or coordination of their care.     Salvatore Decent. Cornelius Moras, MD 06/26/2016 12:47 PM

## 2016-06-26 NOTE — Patient Instructions (Signed)
Patient has been instructed to stop taking warfarin after you take your dose on Wednesday 06/28/2016.  Begin lovenox injections on Thursday 06/29/2016  Patient should continue taking all other medications without change through the day before surgery.  Patient should have nothing to eat or drink after midnight the night before surgery.  On the morning of surgery patient should take only Synthroid, Cardizem CD and Metoprolol with a sip of water.

## 2016-06-27 ENCOUNTER — Encounter: Payer: Medicare Other | Admitting: Thoracic Surgery (Cardiothoracic Vascular Surgery)

## 2016-06-27 ENCOUNTER — Other Ambulatory Visit: Payer: Self-pay | Admitting: *Deleted

## 2016-06-27 DIAGNOSIS — Z7901 Long term (current) use of anticoagulants: Secondary | ICD-10-CM

## 2016-06-27 MED ORDER — ENOXAPARIN SODIUM 150 MG/ML ~~LOC~~ SOLN: 1.5000 mg/kg | SUBCUTANEOUS | Status: DC

## 2016-06-28 ENCOUNTER — Other Ambulatory Visit: Payer: Self-pay | Admitting: *Deleted

## 2016-06-29 ENCOUNTER — Encounter (HOSPITAL_COMMUNITY): Payer: Self-pay

## 2016-06-29 ENCOUNTER — Institutional Professional Consult (permissible substitution) (INDEPENDENT_AMBULATORY_CARE_PROVIDER_SITE_OTHER): Payer: Medicare Other | Admitting: Surgery

## 2016-06-29 ENCOUNTER — Encounter: Payer: Self-pay | Admitting: Surgery

## 2016-06-29 ENCOUNTER — Encounter (HOSPITAL_COMMUNITY)
Admission: RE | Admit: 2016-06-29 | Discharge: 2016-06-29 | Disposition: A | Discharge status: Home / Self Care | Payer: Medicare Other | Source: Ambulatory Visit | Attending: Cardiovascular Disease | Admitting: Cardiovascular Disease

## 2016-06-29 ENCOUNTER — Telehealth: Payer: Self-pay

## 2016-06-29 ENCOUNTER — Other Ambulatory Visit (HOSPITAL_COMMUNITY): Payer: Self-pay | Admitting: *Deleted

## 2016-06-29 ENCOUNTER — Ambulatory Visit (HOSPITAL_COMMUNITY)
Admission: RE | Admit: 2016-06-29 | Discharge: 2016-06-29 | Disposition: A | Discharge status: Home / Self Care | Payer: Medicare Other | Source: Ambulatory Visit | Attending: Cardiovascular Disease | Admitting: Cardiovascular Disease

## 2016-06-29 VITALS — BP 120/74 | HR 67 | Resp 20 | Ht 66.0 in | Wt 148.0 lb

## 2016-06-29 DIAGNOSIS — Z950 Presence of cardiac pacemaker: Secondary | ICD-10-CM | POA: Diagnosis not present

## 2016-06-29 DIAGNOSIS — E039 Hypothyroidism, unspecified: Secondary | ICD-10-CM | POA: Insufficient documentation

## 2016-06-29 DIAGNOSIS — I350 Nonrheumatic aortic (valve) stenosis: Secondary | ICD-10-CM

## 2016-06-29 DIAGNOSIS — I4891 Unspecified atrial fibrillation: Secondary | ICD-10-CM | POA: Diagnosis not present

## 2016-06-29 DIAGNOSIS — D696 Thrombocytopenia, unspecified: Secondary | ICD-10-CM | POA: Insufficient documentation

## 2016-06-29 DIAGNOSIS — I5032 Chronic diastolic (congestive) heart failure: Secondary | ICD-10-CM | POA: Insufficient documentation

## 2016-06-29 DIAGNOSIS — I10 Essential (primary) hypertension: Secondary | ICD-10-CM | POA: Diagnosis not present

## 2016-06-29 DIAGNOSIS — E119 Type 2 diabetes mellitus without complications: Secondary | ICD-10-CM | POA: Diagnosis not present

## 2016-06-29 HISTORY — DX: Hypothyroidism, unspecified: E03.9

## 2016-06-29 HISTORY — DX: Presence of cardiac pacemaker: Z95.0

## 2016-06-29 HISTORY — DX: Unspecified osteoarthritis, unspecified site: M19.90

## 2016-06-29 LAB — BLOOD GAS, ARTERIAL
ACID-BASE EXCESS: 3.9 mmol/L — AB (ref 0.0–2.0)
BICARBONATE: 28 mmol/L (ref 20.0–28.0)
Drawn by: 306361
FIO2: 21
O2 Saturation: 97.1 %
PATIENT TEMPERATURE: 98.6
PO2 ART: 92.5 mmHg (ref 83.0–108.0)
pCO2 arterial: 42.9 mmHg (ref 32.0–48.0)
pH, Arterial: 7.429 (ref 7.350–7.450)

## 2016-06-29 LAB — COMPREHENSIVE METABOLIC PANEL
ALK PHOS: 89 U/L (ref 38–126)
ALT: 19 U/L (ref 14–54)
ANION GAP: 13 (ref 5–15)
AST: 32 U/L (ref 15–41)
Albumin: 4.1 g/dL (ref 3.5–5.0)
BILIRUBIN TOTAL: 0.7 mg/dL (ref 0.3–1.2)
BUN: 18 mg/dL (ref 6–20)
CALCIUM: 9.3 mg/dL (ref 8.9–10.3)
CO2: 23 mmol/L (ref 22–32)
Chloride: 99 mmol/L — ABNORMAL LOW (ref 101–111)
Creatinine, Ser: 0.75 mg/dL (ref 0.44–1.00)
GFR calc non Af Amer: >60 mL/min (ref >60)
Glucose, Bld: 112 mg/dL — ABNORMAL HIGH (ref 65–99)
POTASSIUM: 4.7 mmol/L (ref 3.5–5.1)
SODIUM: 135 mmol/L (ref 135–145)
TOTAL PROTEIN: 7.4 g/dL (ref 6.5–8.1)

## 2016-06-29 LAB — URINALYSIS, ROUTINE W REFLEX MICROSCOPIC
BILIRUBIN URINE: NEGATIVE
Glucose, UA: NEGATIVE mg/dL
HGB URINE DIPSTICK: NEGATIVE
KETONES UR: NEGATIVE mg/dL
Nitrite: NEGATIVE
PROTEIN: NEGATIVE mg/dL
Specific Gravity, Urine: 1.006 (ref 1.005–1.030)
pH: 7 (ref 5.0–8.0)

## 2016-06-29 LAB — SURGICAL PCR SCREEN
MRSA, PCR: NEGATIVE
Staphylococcus aureus: NEGATIVE

## 2016-06-29 LAB — CBC
HCT: 42.5 % (ref 36.0–46.0)
HEMOGLOBIN: 13.9 g/dL (ref 12.0–15.0)
MCH: 30.2 pg (ref 26.0–34.0)
MCHC: 32.7 g/dL (ref 30.0–36.0)
MCV: 92.4 fL (ref 78.0–100.0)
Platelets: 307 10*3/uL (ref 150–400)
RBC: 4.6 MIL/uL (ref 3.87–5.11)
RDW: 14.6 % (ref 11.5–15.5)
WBC: 11.9 10*3/uL — ABNORMAL HIGH (ref 4.0–10.5)

## 2016-06-29 LAB — PROTIME-INR
INR: 2.59
Prothrombin Time: 28.3 seconds — ABNORMAL HIGH (ref 11.4–15.2)

## 2016-06-29 LAB — URINE MICROSCOPIC-ADD ON

## 2016-06-29 LAB — TYPE AND SCREEN
ABO/RH(D): O POS
ANTIBODY SCREEN: NEGATIVE

## 2016-06-29 LAB — GLUCOSE, CAPILLARY: GLUCOSE-CAPILLARY: 156 mg/dL — AB (ref 65–99)

## 2016-06-29 LAB — APTT

## 2016-06-29 NOTE — Telephone Encounter (Signed)
Reviewed results of APTT and stopping Coumadin yesterday with our DOD, Dr. Eden EmmsNishan. Recommendation is to recheck APTT on Monday.   Forwarded to Dr. Excell Seltzerooper and Dr. Jens Somrenshaw.

## 2016-06-29 NOTE — Progress Notes (Signed)
Patient ID: [name], female   DOB: 1926/12/23, 82 y.o.   MRN: [id number]   [facility]  MULTIDISCIPLINARY HEART VALVE CLINIC  CARDIOTHORACIC SURGERY CONSULTATION REPORT  Referring Provider is Crenshaw, Madolyn Frieze, MD PCP is ROBERTS, [name], MD  Chief Complaint  Patient presents with  . Aortic Stenosis    2nd TAVR eval, surgery scheduled for 07/04/16    HPI:  The patient is an 82 year old active woman with a history of chronic persistent atrial fibrillation on Coumadin, tachy-brady syndrome s/p permanent pacer, type 2 DM with peripheral neuropathy, hypertension, hypothyroidism and hyperlipidemia who has severe symptomatic aortic stenosis. She has been followed for chronic atrial fibrillation for about 15 years and has had serial echos that have shown progressive aortic stenosis. Her most recent echo in 01/2016 showed severe AS with a peak gradient of 40 mm Hg and mild to moderate AI. The LVEF was mildly reduced at 45-50% with moderate LVH and diastolic dysfunction. She has remained very active but has developed exertional shortness of breath. She underwent cath as part of her workup for valve replacement and this showed no significant coronary disease. The mean transaortic valve gradient at cath was 35 mm Hg.  She is widowed and lives alone in [place]. She does not have any children but has a niece in [place] whom she is close to and will be coming to stay with her after surgery . She is active and mows her 9 acre property and does gardening. She has not had chest pain or dizziness. She does get swelling in her left leg which has been worked up and attributed to venous insufficiency. She does report intermittent palpitations.  Past Medical History:  Diagnosis Date  . Arthritis   . Chronic diastolic congestive heart failure (HCC)   . Diabetes mellitus   . Dysrhythmia   . History of peptic ulcer disease   . Hypercholesteremia   . Hyperlipidemia   . Hypertension    . Hypothyroidism   . Peripheral neuropathy (HCC)   . Permanent atrial fibrillation (HCC)   . Presence of permanent cardiac pacemaker   . Severe aortic stenosis   . Shortness of breath dyspnea   . Tachycardia-bradycardia syndrome (HCC)    a. s/p SJM single chamber PPM implanted by Dr Reyes Ivan 2009  . Thrombocytopenia (HCC)   . Thyroid disease     Past Surgical History:  Procedure Laterality Date  . ABDOMINAL HYSTERECTOMY    . APPENDECTOMY    . BREAST BIOPSY  02/22/2007    left  . CARDIAC CATHETERIZATION N/A 06/14/2016   Procedure: Left Heart Cath and Coronary Angiography;  Surgeon: Tonny Bollman, MD;  Location: [facility] INVASIVE CV LAB;  Service: Cardiovascular;  Laterality: N/A;  . CARDIOVERSION  05/16/2005   Electrical cardioversion   . ESOPHAGOGASTRODUODENOSCOPY  03/12/2007    Esophagogastroduodenoscopy with control of bleeding  . PACEMAKER INSERTION  06/18/2008   SJM Zephyr XL SR implanted by Dr Reyes Ivan  . SHOULDER SURGERY  12/07/2004    Family History  Problem Relation Age of Onset  . Heart disease Father   . Emphysema Father   . Heart failure Father   . Coronary artery disease Father   . Stroke Sister     cerebral hemorrhage  . Breast cancer Sister   . Heart disease Sister   . Heart failure Sister     Social History   Social History  . Marital status: Married    Spouse name: N/A  .  Number of children: N/A  . Years of education: N/A   Occupational History  . Not on file.   Social History Main Topics  . Smoking status: Former Smoker    Packs/day: 1.00    Years: 10.00    Types: Cigarettes    Quit date: 03/06/1961  . Smokeless tobacco: Never Used  . Alcohol use No  . Drug use: No  . Sexual activity: Not on file   Other Topics Concern  . Not on file   Social History Narrative  . No narrative on file    Current Outpatient Prescriptions  Medication Sig Dispense Refill  . amoxicillin (AMOXIL) 500 MG capsule Take 500 mg by mouth daily as needed (for dental  appointments).     . calcium carbonate (OS-CAL) 600 MG TABS Take 1,200 mg by mouth 3 (three) times a week.     . digoxin (LANOXIN) 0.25 MG tablet Take 1 tablet (0.25 mg total) by mouth daily. 120 tablet 0  . diltiazem (CARTIA XT) 240 MG 24 hr capsule Take 1 capsule (240 mg total) by mouth daily. 90 capsule 3  . doxycycline (VIBRA-TABS) 100 MG tablet Take 1 tablet (100 mg total) by mouth 2 (two) times daily. 28 tablet 0  . enoxaparin (LOVENOX) 100 MG/ML injection Inject 95 mg into the skin daily.     . furosemide (LASIX) 40 MG tablet Take ONE tablet by mouth every other day alternating with TWO tablets every other day. (Patient taking differently: Take 40-80 mg by mouth See admin instructions. Takes 40 mg by mouth every other day alternating with 80 mg the other days.) 135 tablet 3  . gabapentin (NEURONTIN) 300 MG capsule Take 300-900 mg by mouth See admin instructions. Take 300 mg by mouth in the morning and take 900 mg by mouth in the evening.    Marland Kitchen levothyroxine (SYNTHROID, LEVOTHROID) 50 MCG tablet Take 50 mcg by mouth daily.      Marland Kitchen lisinopril (PRINIVIL,ZESTRIL) 2.5 MG tablet Take 2.5 mg by mouth daily.    . metFORMIN (GLUCOPHAGE) 500 MG tablet Take 1,000 mg by mouth 2 (two) times daily.     . metoprolol (LOPRESSOR) 100 MG tablet Take 1 tablet (100 mg total) by mouth 2 (two) times daily. 180 tablet 0  . Multiple Vitamins-Minerals (VISION-VITE PRESERVE PO) Take 1 capsule by mouth 2 (two) times daily.    . psyllium (METAMUCIL) 58.6 % powder Take 1 packet by mouth daily.    . simvastatin (ZOCOR) 20 MG tablet TAKE ONE-HALF TABLET BY MOUTH ONCE DAILY (Patient taking differently: Take 10 mg by mouth once daily) 30 tablet 10  . warfarin (COUMADIN) 5 MG tablet TAKE ONE-HALF TO ONE TABLET BY MOUTH ONCE DAILY (Patient taking differently: Take 2.5 mg by mouth daily. ) 30 tablet 3   No current facility-administered medications for this visit.     Allergies  Allergen Reactions  . Amiodarone Other (See  Comments)    Stops her heart  . Aspirin Other (See Comments)    Gi bleeding  . Codeine Other (See Comments)    GI Lead  . Tramadol Nausea And Vomiting  . Lipitor [Atorvastatin Calcium] Other (See Comments)    Muscle Ache  . Pravachol Other (See Comments)    Muscle Ache      Review of Systems:              General:  normal appetite, normal energy, no weight gain, no weight loss, no fever             Cardiac:                       no chest pain with exertion, no chest pain at rest, + SOB with exertion, no resting SOB, no PND, + orthopnea, + palpitations, + arrhythmia, + atrial fibrillation, + LE edema, no dizzy spells, no syncope             Respiratory:                 + shortness of breath, no home oxygen, no productive cough, intermittent dry cough, no bronchitis, no wheezing, no hemoptysis, no asthma, no pain with inspiration or cough, no sleep apnea, no CPAP at night             GI:                               no difficulty swallowing, no reflux, no frequent heartburn, no hiatal hernia, no abdominal pain, no constipation, no diarrhea, no hematochezia, no hematemesis, no melena             GU:                              no dysuria,  no frequency, no urinary tract infection, no hematuria, no kidney stones, no kidney disease             Vascular:                     no pain suggestive of claudication, + pain in feet, no leg cramps, + varicose veins, no DVT, no non-healing foot ulcer             Neuro:                         no stroke, no TIA's, no seizures, no headaches, no temporary blindness one eye,  no slurred speech, + peripheral neuropathy, no chronic pain, mild instability of gait, no memory/cognitive dysfunction             Musculoskeletal:         mild arthritis, no joint swelling, no myalgias, no difficulty walking, normal mobility              Skin:                            no rash, no itching, no skin infections, no pressure sores or ulcerations              Psych:                         no anxiety, no depression, no nervousness, no unusual recent stress             Eyes:                           no blurry vision, no floaters, no recent vision changes, + wears glasses or contacts             ENT:  no hearing loss, no loose or painful teeth, no dentures, last saw dentist August 2017             Hematologic:               + easy bruising, no abnormal bleeding, no clotting disorder, no frequent epistaxis             Endocrine:                   + diabetes, does check CBG's at home                              Physical Exam:   BP 120/74 (BP Location: Left Arm, Patient Position: Sitting, Cuff Size: Normal)   Pulse 67   Resp 20   Ht 5\' 6"  (1.676 m)   Wt 148 lb (67.1 kg)   SpO2 98%   BMI 23.89 kg/m   General:  Elderly but  well-appearing, looks younger than her age.  HEENT:  Unremarkable , NCAT, PERLA, EOMI, teeth in fair condition  Neck:   no JVD, no bruits, no adenopathy or thyromegaly  Chest:   clear to auscultation, symmetrical breath sounds, no wheezes, no rhonchi   CV:   RRR, grade III/VI crescendo/decrescendo murmur heard best at RSB,   no diastolic murmur  Abdomen:  soft, non-tender, no masses or organomegaly  Extremities:  warm, well-perfused, pulses palpable in feet, mild edema in the left leg.  Rectal/GU  Deferred  Neuro:   Grossly non-focal and symmetrical throughout  Skin:   Clean and dry, no rashes, no breakdown   Diagnostic Tests:  *[facility] at [facility]                  [address]                        [place], [place] [address]                            [phone]  ------------------------------------------------------------------- Transthoracic Echocardiography  Patient:    Kashmir, Leedy MR #:       [id number] Study Date: 01/07/2016 Gender:     F Age:        59 Height:     167.6 cm Weight:     66.7 kg BSA:        1.77 m^2 Pt.  Status: Room:   Evorn Gong Crenshaw  REFERRING    [name]  SONOGRAPHER  [address] RVT, RDCS  ATTENDING    Zoila Shutter MD  PERFORMING   [facility], Outpatient  cc:  ------------------------------------------------------------------- LV EF: 45% -   50%  ------------------------------------------------------------------- History:   PMH:  Acquired from the patient and from the patient&'s chart.  Atrial fibrillation.  Risk factors:  Former tobacco use. Hypertension. Diabetes mellitus.  ------------------------------------------------------------------- Study Conclusions  - Left ventricle: The cavity size was normal. There was moderate   concentric hypertrophy. Systolic function was mildly reduced. The   estimated ejection fraction was in the range of 45% to 50%. Mild   diffuse hypokinesis with no identifiable regional variations. - Aortic valve: Valve mobility was severely restricted. There was   severe stenosis. There was mild to moderate regurgitation   directed centrally in the LVOT. Valve area (VTI): 0.9 cm^2. - Mitral valve: Calcified  annulus. Moderately thickened, mildly   calcified leaflets . There was mild to moderate regurgitation   directed centrally. Valve area by continuity equation (using LVOT   flow): 1.93 cm^2. - Left atrium: The atrium was moderately dilated. - Right ventricle: Systolic function was mildly reduced. - Right atrium: The atrium was moderately dilated. - Tricuspid valve: There was mild-moderate regurgitation directed   centrally. - Pulmonary arteries: Systolic pressure was mildly increased. PA   peak pressure: 37 mm Hg (S).  Transthoracic echocardiography.  M-mode, complete 2D, spectral Doppler, and color Doppler.  Birthdate:  Patient birthdate: March 02, 1927.  Age:  Patient is 82 yr old.  Sex:  Gender: female. BMI: 23.7 kg/m^2.  Blood pressure:     106/70  Patient status: Outpatient.  Study date:  Study date: 01/07/2016. Study  time: 11:15 AM.  -------------------------------------------------------------------  ------------------------------------------------------------------- Left ventricle:  The cavity size was normal. There was moderate concentric hypertrophy. Systolic function was mildly reduced. The estimated ejection fraction was in the range of 45% to 50%.  Mild diffuse hypokinesis with no identifiable regional variations. The study was not technically sufficient to allow evaluation of LV diastolic dysfunction due to atrial fibrillation.  ------------------------------------------------------------------- Aortic valve:   Trileaflet; severely thickened, severely calcified leaflets. Valve mobility was severely restricted.  Doppler:   There was severe stenosis.   There was mild to moderate regurgitation directed centrally in the LVOT.    VTI ratio of LVOT to aortic valve: 0.3. Valve area (VTI): 0.9 cm^2. Indexed valve area (VTI): 0.51 cm^2/m^2. Peak velocity ratio of LVOT to aortic valve: 0.26. Indexed valve area (Vmax): 0.43 cm^2/m^2. Mean velocity ratio of LVOT to aortic valve: 0.3. Indexed valve area (Vmean): 0.5 cm^2/m^2.    Mean gradient (S): 40 mm Hg. Peak gradient (S): 100 mm Hg.  ------------------------------------------------------------------- Aorta:  Aortic root: The aortic root was normal in size. Ascending aorta: The ascending aorta was normal in size.  ------------------------------------------------------------------- Mitral valve:   Calcified annulus. Moderately thickened, mildly calcified leaflets .  Doppler:   There was no evidence for stenosis.   There was mild to moderate regurgitation directed centrally.    Valve area by continuity equation (using LVOT flow): 1.93 cm^2. Indexed valve area by continuity equation (using LVOT flow): 1.09 cm^2/m^2.    Mean gradient (D): 3 mm Hg. Peak gradient (D): 13 mm  Hg.  ------------------------------------------------------------------- Left atrium:  The atrium was moderately dilated.  ------------------------------------------------------------------- Right ventricle:  The cavity size was normal. Systolic function was mildly reduced.  ------------------------------------------------------------------- Pulmonic valve:    Structurally normal valve.   Cusp separation was normal.  Doppler:  Transvalvular velocity was within the normal range. There was mild regurgitation.  ------------------------------------------------------------------- Tricuspid valve:   Structurally normal valve.   Leaflet separation was normal.  Doppler:  Transvalvular velocity was within the normal range. There was mild-moderate regurgitation directed centrally.   ------------------------------------------------------------------- Pulmonary artery:   Systolic pressure was mildly increased.  ------------------------------------------------------------------- Right atrium:  The atrium was moderately dilated.  ------------------------------------------------------------------- Pericardium:  There was no pericardial effusion.  ------------------------------------------------------------------- Measurements   Left ventricle                            Value          Reference  LV ID, ED, PLAX chordal                   47.78 mm       43 - 52  LV ID, ES, PLAX chordal           (H)     38.16 mm       23 - 38  LV fx shortening, PLAX chordal    (L)     20    %        >=29  LV PW thickness, ED                       11.38 mm       ---------  IVS/LV PW ratio, ED                       1.05           <=1.3  Stroke volume, 2D                         65    ml       ---------  Stroke volume/bsa, 2D                     37    ml/m^2   ---------  LV filling time, D, DP                    680   ms       ---------  LV e&', lateral                            4.68  cm/s     ---------   LV E/e&', lateral                          38.89          ---------  LV e&', medial                             4.16  cm/s     ---------  LV E/e&', medial                           43.75          ---------  LV e&', average                            4.42  cm/s     ---------  LV E/e&', average                          41.18          ---------    Ventricular septum                        Value          Reference  IVS thickness, ED                         11.99 mm       ---------    LVOT                                      Value  Reference  LVOT ID, S                                19.4  mm       ---------  LVOT area                                 2.96  cm^2     ---------  LVOT peak velocity, S                     130   cm/s     ---------  LVOT mean velocity, S                     86.2  cm/s     ---------  LVOT VTI, S                               28.5  cm       ---------  LVOT peak gradient, S                     7     mm Hg    ---------  Stroke volume (SV), LVOT DP               84.2  ml       ---------  Stroke index (SV/bsa), LVOT DP            47.6  ml/m^2   ---------    Aortic valve                              Value          Reference  Aortic valve peak velocity, S             501   cm/s     ---------  Aortic valve mean velocity, S             289   cm/s     ---------  Aortic valve VTI, S                       93.9  cm       ---------  Aortic mean gradient, S                   40    mm Hg    ---------  Aortic peak gradient, S                   100   mm Hg    ---------  VTI ratio, LVOT/AV                        0.3            ---------  Aortic valve area, VTI                    0.9   cm^2     ---------  Aortic valve area/bsa, VTI                0.51  cm^2/m^2 ---------  Velocity ratio, peak, LVOT/AV  0.26           ---------  Aortic valve area/bsa, peak               0.43  cm^2/m^2 ---------  velocity  Velocity ratio, mean, LVOT/AV             0.3             ---------  Aortic valve area/bsa, mean               0.5   cm^2/m^2 ---------  velocity  Aortic regurg pressure half-time          434   ms       ---------    Aorta                                     Value          Reference  Aortic root ID, ED                        31    mm       ---------    Left atrium                               Value          Reference  LA ID, A-P, ES                            46    mm       ---------  LA ID/bsa, A-P                    (H)     2.6   cm/m^2   <=2.2  LA volume, S                              102   ml       ---------  LA volume/bsa, S                          57.7  ml/m^2   ---------  LA volume, ES, 1-p A4C                    112   ml       ---------  LA volume/bsa, ES, 1-p A4C                63.3  ml/m^2   ---------  LA volume, ES, 1-p A2C                    88    ml       ---------  LA volume/bsa, ES, 1-p A2C                49.8  ml/m^2   ---------    Mitral valve                              Value          Reference  Mitral E-wave peak velocity  182   cm/s     ---------  Mitral mean velocity, D                   65.01 cm/s     ---------  Mitral deceleration time          (L)     148   ms       150 - 230  Mitral mean gradient, D                   3     mm Hg    ---------  Mitral peak gradient, D                   13    mm Hg    ---------  Mitral valve area, LVOT                   1.93  cm^2     ---------  continuity  Mitral valve area/bsa, LVOT               1.09  cm^2/m^2 ---------  continuity  Mitral annulus VTI, D                     45.7  cm       ---------  Mitral maximal regurg velocity,           640   cm/s     ---------  PISA  Mitral regurg VTI, PISA                   215   cm       ---------  Mitral ERO, PISA                          0.15  cm^2     ---------  Mitral regurg volume, PISA                32    ml       ---------  Mitral regurg fraction, PISA              27.53 %        ---------    Pulmonary arteries                         Value          Reference  PA pressure, S, DP                (H)     37    mm Hg    <=30    Tricuspid valve                           Value          Reference  Tricuspid regurg peak velocity            271   cm/s     ---------  Tricuspid peak RV-RA gradient             29    mm Hg    ---------    Systemic veins                            Value          Reference  Estimated CVP                             8     mm Hg    ---------    Right ventricle                           Value          Reference  RV pressure, S, DP                (H)     37    mm Hg    <=30    Pulmonic valve                            Value          Reference  Pulmonic valve peak velocity, S           64.3  cm/s     ---------  Pulmonic regurg velocity, ED              82.9  cm/s     ---------  Legend: (L)  and  (H)  mark values outside specified reference range.  ------------------------------------------------------------------- Prepared and Electronically Authenticated by  Thurmon Fair, MD 2017-04-14T14:05:24   Panel Physicians Referring Physician Case Authorizing Physician  Tonny Bollman, MD (Primary)    Procedures   Left Heart Cath and Coronary Angiography  Conclusion     The left ventricular systolic function is normal.  The left ventricular ejection fraction is 55-65% by visual estimate.  There is mild (2+) mitral regurgitation.  There is severe aortic valve stenosis. There is trivial (1+) aortic regurgitation.   1. Widely patent coronary arteries with no angiographic disease in the LAD, left main, or right coronary artery and minimal irregularity in the left circumflex 2. Calcified aortic valve with restricted leaflet motion and severe aortic stenosis with a mean gradient of 35 mmHg 3. Normal LV systolic function with mild mitral regurgitation by ventriculography 4. Normal aortic root without evidence of aneurysm, estimated angle for valve deployment LAO 5, caudal  5 5. Aborted right heart catheterization (estimated PASP by echo 37 mmHg)  Cardiac surgical evaluation for further assessment of treatment options for severe symptomatic aortic stenosis.  If CTA studies indicate suitable anatomy, pt likely will be a good candidate for TAVR.  Resume warfarin today   Indications   Severe aortic stenosis [I35.0 (ICD-10-CM)]  Procedural Details/Technique   Technical Details INDICATION: Severe symptomatic aortic stenosis  PROCEDURAL DETAILS: There was an indwelling IV in a right antecubital vein. Using normal sterile technique, the IV was changed out for a 5 Fr brachial sheath over a 0.018 inch wire. The right wrist was then prepped, draped, and anesthetized with 1% lidocaine. Using the modified Seldinger technique a 5/6 French Slender sheath was placed in the right radial artery. Intra-arterial verapamil was administered through the radial artery sheath. IV heparin was administered after a JR4 catheter was advanced into the central aorta. I tried to advance a Swan-Ganz catheter from the right antecubital vein. I could advance it to the innominate vein but no further than that. I was able to advance a 0.025 inch guidewire into the right atrium without difficulty, but the catheter would not track over it. The right heart catheterization was eventually aborted. Standard Judkins catheters were used for selective coronary angiography, aortic  root angiography, and left ventriculography. There were no immediate procedural complications. The patient was transferred to the post catheterization recovery area for further monitoring.  During this procedure the patient is administered a total of Versed 1 mg and Fentanyl 25 mg to achieve and maintain moderate conscious sedation. The patient's heart rate, blood pressure, and oxygen saturation are monitored continuously during the procedure. The period of conscious sedation is 26 minutes, of which I was present face-to-face 100% of  this time.   Estimated blood loss <50 mL. .    Coronary Findings   Dominance: Right  Left Anterior Descending  Vessel is angiographically normal.  Left Circumflex  The vessel exhibits minimal luminal irregularities.  Right Coronary Artery  Vessel is angiographically normal.  Wall Motion              Left Heart   Left Ventricle The left ventricular size is normal. The left ventricular systolic function is normal. The left ventricular ejection fraction is 55-65% by visual estimate. No regional wall motion abnormalities. There is mild (2+) mitral regurgitation.    Aortic Valve There is severe aortic valve stenosis. There is trivial (1+) aortic regurgitation. The aortic valve is calcified. There is restricted aortic valve motion. The aortic valve is calcified with restricted motion. The mean transvalvular gradient is 35 mmHg. There is 1+ aortic insufficiency    Coronary Diagrams   Diagnostic Diagram     Implants     No implant documentation for this case.  PACS Images   Show images for Cardiac catheterization   Link to Procedure Log   Procedure Log    Hemo Data   Flowsheet Row Most Recent Value  Aortic Mean Gradient 35.4 mmHg  Aortic Peak Gradient 44 mmHg  AO Systolic Pressure 125 mmHg  AO Diastolic Pressure 50 mmHg  AO Mean 78 mmHg  LV Systolic Pressure 159 mmHg  LV Diastolic Pressure 4 mmHg  LV EDP 11 mmHg  Arterial Occlusion Pressure Extended Systolic Pressure 123 mmHg  Arterial Occlusion Pressure Extended Diastolic Pressure 50 mmHg  Arterial Occlusion Pressure Extended Mean Pressure 79 mmHg  Left Ventricular Apex Extended Systolic Pressure 167 mmHg  Left Ventricular Apex Extended Diastolic Pressure 5 mmHg  Left Ventricular Apex Extended EDP Pressure 14 mmHg      ADDENDUM REPORT: 06/27/2016 07:57  ADDENDUM: Please see separate dictation for contemporaneously obtained CTA of the chest, abdomen and pelvis obtained 06/23/2016 for full description of  important extracardiac findings.   Electronically Signed   By: Trudie Reed M.D.   On: 06/27/2016 07:57   Addended by Florencia Reasons, MD on 06/27/2016 8:00 AM    Study Result   MEDICATIONS: None  EXAM: Cardiac TAVR CT  TECHNIQUE: The patient was scanned on a Philips 256 scanner. A 120 kV retrospective scan was triggered in the descending thoracic aorta at 111 HU's. Gantry rotation speed was 270 msecs and collimation was .9 mm. No beta blockade or nitro were given. The 3D data set was reconstructed in 5% intervals of the R-R cycle. Systolic and diastolic phases were analyzed on a dedicated work station using MPR, MIP and VRT modes. The patient received 80 cc of contrast.  Medications:  None  FINDINGS: Aortic Valve: Calcified trileaflet. Nodular calcification of annulus at base of left and non coronary cusps.  Aorta: Mild calcification of the inferior surface of the root, and arch. Normal arch vessel take off  Sinotubular Junction:  28 mm  Ascending Thoracic Aorta:  32 mm  Aortic Arch:  26 mm  Descending Thoracic Aorta:  22 mm  Sinus of Valsalva Measurements:  Non-coronary:  31 mm  Right -coronary:  32 mm  Left -coronary:  32 mm  Coronary Artery Height above Annulus:  Left Main:  16 mm  Right Coronary:  16.6 mm  Virtual Basal Annulus Measurements:  Maximum/Minimum Diameter:  20 mm x 26.4 mm  Perimeter:  79 mm  Area:  440 mm2  Coronary Arteries:  Sufficient height above annulus for deployment  Optimum Fluoroscopic Angle for Delivery:  LAO 13 degrees  IMPRESSION: 1) Calcified trileaflet aortic valve with an arnnular area of 440 mm2 suitable for a 26 mm Sapien 3 valve  2) Nodular calcification of annulus at base of non and left cusps  3) Suitable coronary height for deployment  4) Optimum angiographic angle for deployment LAO 13 degrees  5) Severe biatrial enlargement with likely LAA thrombus and  pacer system seen on RA/RV  Charlton Haws  Electronically Signed: By: Charlton Haws M.D. On: 06/23/2016 10:07       CLINICAL DATA:  82 year old female with history of severe aortic stenosis. Preprocedural study prior to potential transcatheter aortic valve replacement (TAVR) procedure.  EXAM: CT ANGIOGRAPHY CHEST, ABDOMEN AND PELVIS  TECHNIQUE: Multidetector CT imaging through the chest, abdomen and pelvis was performed using the standard protocol during bolus administration of intravenous contrast. Multiplanar reconstructed images and MIPs were obtained and reviewed to evaluate the vascular anatomy.  CONTRAST:  70 mL of Isovue 370.  COMPARISON:  No priors.  FINDINGS: CTA CHEST FINDINGS  Mediastinum/Lymph Nodes: Heart size is enlarged with left atrial dilatation. There is a filling defect in the tip of the left atrial appendage, concerning for left atrial appendage thrombus (image 72 of series 401). There is no significant pericardial fluid, thickening or pericardial calcification. Atherosclerosis of the thoracic aorta, without evidence of aneurysm or dissection. No definite coronary artery calcifications. Severe thickening calcifications of the aortic valve. Thickening calcification of the mitral-aortic intervalvular fibrosa, as well as the mitral valve and annulus. Left-sided pacemaker device in place with lead tip terminating in the right ventricle along the mid interventricular septum. No pathologically enlarged mediastinal or hilar lymph nodes. Esophagus is unremarkable in appearance. No axillary lymphadenopathy.  Lungs/Pleura: No suspicious appearing pulmonary nodules or masses. No acute consolidative airspace disease. No pleural effusions.  Musculoskeletal/Soft Tissues: There are no aggressive appearing lytic or blastic lesions noted in the visualized portions of the skeleton.  CTA ABDOMEN AND PELVIS FINDINGS  Hepatobiliary: No suspicious  cystic or solid hepatic lesions. No intra or extrahepatic biliary ductal dilatation. Gallbladder is normal in appearance.  Pancreas: No pancreatic mass. No pancreatic ductal dilatation. No pancreatic or peripancreatic fluid or inflammatory changes.  Spleen: Unremarkable.  Adrenals/Urinary Tract: Sub cm low-attenuation lesion in the medial aspect of the upper pole the left kidney is too small to definitively characterize, but is statistically likely a tiny cyst. Right kidney and bilateral adrenal glands are normal in appearance. No hydroureteronephrosis. Urinary bladder is normal in appearance.  Stomach/Bowel: The appearance of the stomach is normal. There is no pathologic dilatation of small bowel or colon. Numerous colonic diverticulae are noted, without surrounding inflammatory changes to suggest an acute diverticulitis at this time. The appendix is not confidently identified and may be surgically absent. Regardless, there are no inflammatory changes noted adjacent to the cecum to suggest the presence of an acute appendicitis at this time.  Vascular/Lymphatic: Vascular findings and measurements pertinent to potential TAVR procedure,  as detailed below. Celiac axis, superior mesenteric artery and inferior mesenteric artery are all widely patent without evidence of hemodynamically significant stenosis. Single renal arteries bilaterally are widely patent. No lymphadenopathy noted in the abdomen or pelvis.  Reproductive: Status post hysterectomy. Ovaries are not confidently identified may be surgically absent or atrophic.  Other: No significant volume of ascites.  No pneumoperitoneum.  Musculoskeletal: There are no aggressive appearing lytic or blastic lesions noted in the visualized portions of the skeleton.  VASCULAR MEASUREMENTS PERTINENT TO TAVR:  AORTA:  Minimal Aortic Diameter -  12 x 14 mm  Severity of Aortic Calcification -  moderate  RIGHT  PELVIS:  Right Common Iliac Artery -  Minimal Diameter - 6.4 x 10.3 mm  Tortuosity - mild  Calcification - moderate  Right External Iliac Artery -  Minimal Diameter - 6.7 x 6.1 mm  Tortuosity - mild  Calcification - minimal  Right Common Femoral Artery -  Minimal Diameter - 5.8 x 7.0 mm  Tortuosity - mild  Calcification - mild  LEFT PELVIS:  Left Common Iliac Artery -  Minimal Diameter - 6.8 x 8.2 mm  Tortuosity - moderate  Calcification - moderate  Left External Iliac Artery -  Minimal Diameter - 6.7 x 6.1 mm  Tortuosity - mild  Calcification - none  Left Common Femoral Artery -  Minimal Diameter - 5.0 x 6.9 mm  Tortuosity - mild  Calcification - mild  Review of the MIP images confirms the above findings.  IMPRESSION: 1. Vascular findings and measurements pertinent to potential TAVR procedure, as detailed above. This patient appears to have suitable pelvic arterial access bilaterally. 2. Severe thickening calcification of the aortic valve, compatible with the reported clinical history of severe aortic stenosis. There is also extensive calcification of the mitral-aortic intervalvular fibrosa, and thickening calcification of the mitral valve and annulus. 3. Cardiomegaly with left atrial dilatation, and small filling defect in the tip of the left atrial appendage concerning for left atrial appendage thrombus. 4. Colonic diverticulosis without evidence of acute diverticulitis at this time. 5. Additional incidental findings, as above.   Electronically Signed   By: Trudie Reed M.D.   On: 06/27/2016 09:58    Impression:  This 82 year old woman has stage D severe symptomatic aortic stenosis with a several month history of exertional shortness of breath.  I have personally reviewed and interpreted her most recent echocardiogram,  cardiac catheterization, and CT angiograms. Echocardiogram performed in 01/2016 shows  severe aortic stenosis with a trileaflet valve with severe calcification, thickening, and restricted leaflet mobility involving all 3 leaflets of the valve. Mean transvalvular gradient is  40 mmHg. Left ventricular systolic function remains preserved. There was mild to moderate mitral regurgitation. Cardiac cath shows absence of significant coronary artery disease and confirms the presence of severe aortic stenosis with a mean gradient of 35 mm Hg. I agree that TAVR is the best option for this 82 year old woman who is still active. It will allow her to remain active with less operative risk and a much quicker recovery. Her gated cardiac CT shows that her annulus is adequate size for a 26 mm Sapien 3 valve. There is some calcification of the intervalvular fibrosa and nodular calcifications in the annulus at the base of the non and left coronary cusps which could increase the risk of paravalvular leak. There is also a question of a left atrial thrombus but this is small and may be trabeculations. She has been therapeutic on  chronic Coumadin therapy. The patient is interested in proceeding with transcatheter aortic valve replacement as soon as possible as an alternative to conventional surgical aortic valve replacement. She understands that we will perform TEE first and possibly delay TAVR if there is clear evidence for clot in the left atrial appendage.  The patient has been advised of a variety of complications that might develop including but not limited to risks of death, stroke, paravalvular leak, aortic dissection or other major vascular complications, aortic annulus rupture, device embolization, cardiac rupture or perforation, mitral regurgitation, acute myocardial infarction, arrhythmia, heart block or bradycardia requiring permanent pacemaker placement, congestive heart failure, respiratory failure, renal failure, pneumonia, infection, other late complications related to structural valve deterioration or  migration, or other complications that might ultimately cause a temporary or permanent loss of functional independence or other long term morbidity.  The patient provides full informed consent for the procedure as described and all questions were answered.  Plan:  She has already stopped her Coumadin and started Lovenox injections today for bridge. She will stop Metformin 2 days prior to surgery. She will return Tuesday next week for TAVR surgery, assuming there is no significant clot in the LAA on TEE.  I spent 40 minutes performing this consultation and > 50% of this time was spent face to face counseling and coordinating the care of this patient's severe symptomatic aortic stenosis.  Alleen BorneBryan K. Mersadies Petree, MD 06/29/2016

## 2016-06-29 NOTE — Progress Notes (Signed)
Dr. Earmon Phoenixooper's office called to notify results of PTT >200 sec. Gave message to Michail JewelsRenee George to give to Dr. Excell Seltzerooper or whichever physican is available.

## 2016-06-29 NOTE — Progress Notes (Signed)
[name] ([employer]) rep called and was notified of date and time of pt. Surgery.

## 2016-06-29 NOTE — Telephone Encounter (Signed)
Received call from operator. I spoke with [facility] nurse, Rogelia Mireynthia Michael,  regarding this PTT panic lab greater than 200 sec. I informed I would forward to Dr. Excell Seltzerooper and his nurse. Pt took last dose of Coumadin yesterday. Nurse said this was just a call to inform, I do not need to call her back.

## 2016-06-29 NOTE — Pre-Procedure Instructions (Signed)
[name]  06/29/2016      [facility] [address] - [place], [place] - [address] [place] [place] [id number] Phone: [phone] Fax: [phone]    Your procedure is scheduled on 07-04-2016   Tuesday .  [facility] Admitting at 5:30 A.M.   Call this number if you have problems the morning of surgery:  [phone]   Remember:  Do not eat food or drink liquids after midnight.   Take these medicines the morning of surgery with A SIP OF WATER digoxin(Lanoxin),diltiazem(Cartia XT),gabapentin(neurontin),levothyroxine(synthroid),Simvastatin(Zocor),           FOLLOW MD INSTRUCTIONS ON COUMADIN.         STOP ASPIRIN,ANTIINFLAMATORIES (IBUPROFEN,ALEVE,MOTRIN,ADVIL,GOODY'S POWDERS),HERBAL SUPPLEMENTS,FISH OIL,AND VITAMINS 5-7 DAYS PRIOR TO SURGERY             How to Manage Your Diabetes Before and After Surgery  Why is it important to control my blood sugar before and after surgery? . Improving blood sugar levels before and after surgery helps healing and can limit problems. . A way of improving blood sugar control is eating a healthy diet by: o  Eating less sugar and carbohydrates o  Increasing activity/exercise o  Talking with your doctor about reaching your blood sugar goals . High blood sugars (greater than 180 mg/dL) can raise your risk of infections and slow your recovery, so you will need to focus on controlling your diabetes during the weeks before surgery. . Make sure that the doctor who takes care of your diabetes knows about your planned surgery including the date and location.  How do I manage my blood sugar before surgery? . Check your blood sugar at least 4 times a day, starting 2 days before surgery, to make sure that the level is not too high or low. o Check your blood sugar the morning of your surgery when you wake up and every 2 hours until you get to the Short Stay unit. . If your blood sugar is less than  70 mg/dL, you will need to treat for low blood sugar: o Do not take insulin. o Treat a low blood sugar (less than 70 mg/dL) with  cup of clear juice (cranberry or apple), 4 glucose tablets, OR glucose gel. o Recheck blood sugar in 15 minutes after treatment (to make sure it is greater than 70 mg/dL). If your blood sugar is not greater than 70 mg/dL on recheck, call [phone] for further instructions. . Report your blood sugar to the short stay nurse when you get to Short Stay.  . If you are admitted to the hospital after surgery: o Your blood sugar will be checked by the staff and you will probably be given insulin after surgery (instead of oral diabetes medicines) to make sure you have good blood sugar levels. o The goal for blood sugar control after surgery is 80-180 mg/dL.              WHAT DO I DO ABOUT MY DIABETES MEDICATION?   Marland Kitchen. Do not take oral diabetes medicines (pills) the morning of surgery. .      Reviewed and Endorsed by [facility] Patient Education Committee, August 2015         Do not wear jewelry, make-up or nail polish.  Do not wear lotions, powders, or perfumes, or deoderant.  Do not shave 48 hours prior to surgery.     Do not bring valuables to the hospital.  [facility] is not responsible for any belongings or valuables.  Contacts, dentures or bridgework may not be worn into surgery.  Leave your suitcase in the car.  After surgery it may be brought to your room.  For patients admitted to the hospital, discharge time will be determined by your treatment team.  Patients discharged the day of surgery will not be allowed to drive home.    Special instructions:  SEE ATTACHED INSTRUCTIONS ON CHG SHOWERS  Please read over the following fact sheets that you were given. MRSA Information,Incentive Spirometry

## 2016-06-29 NOTE — Progress Notes (Signed)
Message left for Janet FortBrian Benitez ([employer] ) rep to call,information left for date and time of surgery.  Has not returned call.

## 2016-06-30 LAB — HEMOGLOBIN A1C
Hgb A1c MFr Bld: 7.1 % — ABNORMAL HIGH (ref 4.8–5.6)
MEAN PLASMA GLUCOSE: 157 mg/dL

## 2016-06-30 NOTE — Progress Notes (Signed)
Anesthesia chart review: Patient is an 82 year old female scheduled for TAVR, transfemoral approach on 07/04/2016 by Dr. Tonny BollmanMichael Cooper.  History includes severe AS, former smoker, hypertension, hyperlipidemia, diabetes mellitus type 2, chronic diastolic CHF, atrial fibrillation, tachybradycardia syndrome s/p [employer] single-chamber permanent pacemaker '09, thrombocytopenia, hypothyroidism, peripheral neuropathy, hysterectomy, appendectomy. PCP is Dr. Burton Apleyonald Roberts. Cardiologist is Dr. Olga MillersBrian Crenshaw.  Meds include warfarin (last dose 06/28/16), Zocor, Lopressor, metformin, lisinopril, levothyroxine, Neurontin, Lasix, Lovenox (last dose 07/03/16), Cartia XT, digoxin.  BP (!) 128/59   Pulse 73   Temp 36.4 C   Resp 18   Ht 5\' 6"  (1.676 m)   Wt 148 lb (67.1 kg)   SpO2 98%   BMI 23.89 kg/m   06/14/16 Cardiac cath:  The left ventricular systolic function is normal.  The left ventricular ejection fraction is 55-65% by visual estimate.  There is mild (2+) mitral regurgitation.  There is severe aortic valve stenosis. There is trivial (1+) aortic regurgitation. 1. Widely patent coronary arteries with no angiographic disease in the LAD, left main, or right coronary artery and minimal irregularity in the left circumflex 2. Calcified aortic valve with restricted leaflet motion and severe aortic stenosis with a mean gradient of 35 mmHg 3. Normal LV systolic function with mild mitral regurgitation by ventriculography 4. Normal aortic root without evidence of aneurysm, estimated angle for valve deployment LAO 5, caudal 5 5. Aborted right heart catheterization (estimated PASP by echo 37 mmHg) Cardiac surgical evaluation for further assessment of treatment options for severe symptomatic aortic stenosis.  If CTA studies indicate suitable anatomy, pt likely will be a good candidate for TAVR.  01/07/16 Echo: Study Conclusions - Left ventricle: The cavity size was normal. There was moderate  concentric hypertrophy. Systolic function was mildly reduced. The   estimated ejection fraction was in the range of 45% to 50%. Mild   diffuse hypokinesis with no identifiable regional variations. - Aortic valve: Valve mobility was severely restricted. There was   severe stenosis. There was mild to moderate regurgitation   directed centrally in the LVOT. Valve area (VTI): 0.9 cm^2. - Mitral valve: Calcified annulus. Moderately thickened, mildly   calcified leaflets . There was mild to moderate regurgitation   directed centrally. Valve area by continuity equation (using LVOT   flow): 1.93 cm^2. - Left atrium: The atrium was moderately dilated. - Right ventricle: Systolic function was mildly reduced. - Right atrium: The atrium was moderately dilated. - Tricuspid valve: There was mild-moderate regurgitation directed   centrally. - Pulmonary arteries: Systolic pressure was mildly increased. PA   peak pressure: 37 mm Hg (S).  01/07/16 Carotid U/S: Impression: Stable minimal plaque, with 1-39% bilateral ICA stenosis. Patent vertebral arteries with antegrade flow. Normal subclavian arteries, bilaterally.  Preoperative EKG, cardiac CT, CTA chest/abd/pelvis, chest x-ray, PFTs, ABG, labs noted. A1c 7.1. INR 2.59, PTT > 200 (just stopped warfarin and started Lovenox). PT/PTT results already called to Dr. Earmon Phoenixooper's office. She will need STAT PT/PTT on arrival the day of surgery. [employer] Rep notified of date and time of surgery per PAT RN.  If repeat labs acceptable then I would anticipate that she can proceed as planned. Per Dr. Sharee PimpleBartle's note, TEE will be performed first and could possibly delay TAVR if there is clear evidence for LA appendage thrombus.  Janet Ochsllison Absalom Aro, PA-C [facility]/Anesthesiology Phone [phone] 06/30/2016 10:56 AM

## 2016-07-03 MED ORDER — DEXMEDETOMIDINE HCL IN NACL 400 MCG/100ML IV SOLN
0.1000 ug/kg/h | INTRAVENOUS | Status: AC
  Administered 2016-07-04: .5 ug/kg/h via INTRAVENOUS
  Filled 2016-07-03: qty 100

## 2016-07-03 MED ORDER — NOREPINEPHRINE BITARTRATE 1 MG/ML IV SOLN
0.0000 ug/min | INTRAVENOUS | Status: AC
  Administered 2016-07-04: 1 ug/min via INTRAVENOUS
  Filled 2016-07-03: qty 4

## 2016-07-03 MED ORDER — POTASSIUM CHLORIDE 2 MEQ/ML IV SOLN
80.0000 meq | INTRAVENOUS | Status: DC
  Filled 2016-07-03: qty 40

## 2016-07-03 MED ORDER — NITROGLYCERIN IN D5W 200-5 MCG/ML-% IV SOLN
2.0000 ug/min | INTRAVENOUS | Status: DC
  Filled 2016-07-03: qty 250

## 2016-07-03 MED ORDER — INSULIN REGULAR HUMAN 100 UNIT/ML IJ SOLN
INTRAMUSCULAR | Status: DC
  Filled 2016-07-03: qty 2.5

## 2016-07-03 MED ORDER — SODIUM CHLORIDE 0.9 % IV SOLN
INTRAVENOUS | Status: DC
  Filled 2016-07-03: qty 30

## 2016-07-03 MED ORDER — EPINEPHRINE HCL 1 MG/ML IJ SOLN
0.0000 ug/min | INTRAVENOUS | Status: DC
  Filled 2016-07-03: qty 4

## 2016-07-03 MED ORDER — DEXTROSE 5 % IV SOLN
30.0000 ug/min | INTRAVENOUS | Status: DC
  Filled 2016-07-03: qty 2

## 2016-07-03 MED ORDER — SODIUM CHLORIDE 0.9 % IV SOLN: INTRAVENOUS | Status: DC

## 2016-07-03 MED ORDER — DOPAMINE-DEXTROSE 3.2-5 MG/ML-% IV SOLN
0.0000 ug/kg/min | INTRAVENOUS | Status: DC
  Filled 2016-07-03: qty 250

## 2016-07-03 MED ORDER — MAGNESIUM SULFATE 50 % IJ SOLN
40.0000 meq | INTRAMUSCULAR | Status: DC
  Filled 2016-07-03: qty 10

## 2016-07-03 MED ORDER — VANCOMYCIN HCL 10 G IV SOLR
1250.0000 mg | INTRAVENOUS | Status: AC
  Administered 2016-07-04: 1250 mg via INTRAVENOUS
  Filled 2016-07-03 (×2): qty 1250

## 2016-07-03 MED ORDER — DEXTROSE 5 % IV SOLN
1.5000 g | INTRAVENOUS | Status: AC
  Administered 2016-07-04: 1.5 g via INTRAVENOUS
  Filled 2016-07-03 (×2): qty 1.5

## 2016-07-03 NOTE — Anesthesia Preprocedure Evaluation (Addendum)
Anesthesia Evaluation   Patient awake    Reviewed: Allergy & Precautions, NPO status , Patient's Chart, lab work & pertinent test results  Airway Mallampati: II  TM Distance: >3 FB Neck ROM: Full    Dental  (+) Dental Advisory Given   Pulmonary shortness of breath and with exertion, former smoker,    breath sounds clear to auscultation       Cardiovascular hypertension, Pt. on medications and Pt. on home beta blockers +CHF  + dysrhythmias + pacemaker  Rhythm:Irregular Rate:Normal  01/2016: Left ventricle: The cavity size was normal. There was moderate   concentric hypertrophy. Systolic function was mildly reduced. The   estimated ejection fraction was in the range of 45% to 50%. Mild   diffuse hypokinesis with no identifiable regional variations. - Aortic valve: Valve mobility was severely restricted. There was   severe stenosis. There was mild to moderate regurgitation   directed centrally in the LVOT. Valve area (VTI): 0.9 cm^2. - Mitral valve: Calcified annulus. Moderately thickened, mildly   calcified leaflets . There was mild to moderate regurgitation   directed centrally. Valve area by continuity equation (using LVOT   flow): 1.93 cm^2. - Left atrium: The atrium was moderately dilated. - Right ventricle: Systolic function was mildly reduced. - Right atrium: The atrium was moderately dilated. - Tricuspid valve: There was mild-moderate regurgitation directed   centrally. - Pulmonary arteries: Systolic pressure was mildly increased. PA   peak pressure: 37 mm Hg (S).   Neuro/Psych negative neurological ROS     GI/Hepatic negative GI ROS, Neg liver ROS,   Endo/Other  diabetesHypothyroidism   Renal/GU negative Renal ROS     Musculoskeletal  (+) Arthritis ,   Abdominal   Peds  Hematology negative hematology ROS (+)   Anesthesia Other Findings   Reproductive/Obstetrics                             Anesthesia Physical Anesthesia Plan  ASA: III  Anesthesia Plan: General   Post-op Pain Management:    Induction: Intravenous  Airway Management Planned: Oral ETT  Additional Equipment: Arterial line, TEE and CVP  Intra-op Plan:   Post-operative Plan: Extubation in OR and Possible Post-op intubation/ventilation  Informed Consent: I have reviewed the patients History and Physical, chart, labs and discussed the procedure including the risks, benefits and alternatives for the proposed anesthesia with the patient or authorized representative who has indicated his/her understanding and acceptance.   Dental advisory given  Plan Discussed with: CRNA  Anesthesia Plan Comments: (Plan on TEE after induction prior to central line/ swan placement due to possibility of LAA clot.)       Anesthesia Quick Evaluation

## 2016-07-04 ENCOUNTER — Inpatient Hospital Stay (HOSPITAL_COMMUNITY): Payer: Medicare Other | Admitting: Vascular Surgery

## 2016-07-04 ENCOUNTER — Inpatient Hospital Stay (HOSPITAL_COMMUNITY): Payer: Medicare Other

## 2016-07-04 ENCOUNTER — Encounter (HOSPITAL_COMMUNITY)
Admission: RE | Disposition: A | Discharge status: Home / Self Care | Payer: Self-pay | Source: Ambulatory Visit | Attending: Cardiovascular Disease

## 2016-07-04 ENCOUNTER — Inpatient Hospital Stay (HOSPITAL_COMMUNITY)
Admission: RE | Admit: 2016-07-04 | Discharge: 2016-07-07 | DRG: 266 | Disposition: A | Discharge status: Home / Self Care | Payer: Medicare Other | Source: Ambulatory Visit | Attending: Cardiovascular Disease | Admitting: Cardiovascular Disease

## 2016-07-04 ENCOUNTER — Encounter (HOSPITAL_COMMUNITY): Payer: Self-pay | Admitting: Thoracic Surgery (Cardiothoracic Vascular Surgery)

## 2016-07-04 DIAGNOSIS — E785 Hyperlipidemia, unspecified: Secondary | ICD-10-CM | POA: Diagnosis present

## 2016-07-04 DIAGNOSIS — Z952 Presence of prosthetic heart valve: Secondary | ICD-10-CM

## 2016-07-04 DIAGNOSIS — Z87891 Personal history of nicotine dependence: Secondary | ICD-10-CM

## 2016-07-04 DIAGNOSIS — Z7984 Long term (current) use of oral hypoglycemic drugs: Secondary | ICD-10-CM

## 2016-07-04 DIAGNOSIS — R0602 Shortness of breath: Secondary | ICD-10-CM

## 2016-07-04 DIAGNOSIS — Z950 Presence of cardiac pacemaker: Secondary | ICD-10-CM | POA: Diagnosis not present

## 2016-07-04 DIAGNOSIS — I110 Hypertensive heart disease with heart failure: Secondary | ICD-10-CM | POA: Diagnosis present

## 2016-07-04 DIAGNOSIS — Z006 Encounter for examination for normal comparison and control in clinical research program: Secondary | ICD-10-CM

## 2016-07-04 DIAGNOSIS — J9811 Atelectasis: Secondary | ICD-10-CM

## 2016-07-04 DIAGNOSIS — E1142 Type 2 diabetes mellitus with diabetic polyneuropathy: Secondary | ICD-10-CM | POA: Diagnosis present

## 2016-07-04 DIAGNOSIS — Z79899 Other long term (current) drug therapy: Secondary | ICD-10-CM | POA: Diagnosis not present

## 2016-07-04 DIAGNOSIS — I872 Venous insufficiency (chronic) (peripheral): Secondary | ICD-10-CM | POA: Diagnosis present

## 2016-07-04 DIAGNOSIS — Z7901 Long term (current) use of anticoagulants: Secondary | ICD-10-CM | POA: Diagnosis not present

## 2016-07-04 DIAGNOSIS — I350 Nonrheumatic aortic (valve) stenosis: Secondary | ICD-10-CM | POA: Diagnosis present

## 2016-07-04 DIAGNOSIS — I481 Persistent atrial fibrillation: Secondary | ICD-10-CM | POA: Diagnosis present

## 2016-07-04 DIAGNOSIS — E039 Hypothyroidism, unspecified: Secondary | ICD-10-CM | POA: Diagnosis present

## 2016-07-04 DIAGNOSIS — Z954 Presence of other heart-valve replacement: Secondary | ICD-10-CM | POA: Diagnosis not present

## 2016-07-04 DIAGNOSIS — I5033 Acute on chronic diastolic (congestive) heart failure: Secondary | ICD-10-CM | POA: Diagnosis not present

## 2016-07-04 HISTORY — DX: Presence of prosthetic heart valve: Z95.2

## 2016-07-04 HISTORY — PX: TRANSCATHETER AORTIC VALVE REPLACEMENT, TRANSFEMORAL: SHX6400

## 2016-07-04 HISTORY — PX: TEE WITHOUT CARDIOVERSION: SHX5443

## 2016-07-04 LAB — POCT I-STAT, CHEM 8
BUN: 18 mg/dL (ref 6–20)
BUN: 17 mg/dL (ref 6–20)
BUN: 16 mg/dL (ref 6–20)
CALCIUM ION: 1.16 mmol/L (ref 1.15–1.40)
CHLORIDE: 101 mmol/L (ref 101–111)
Calcium, Ion: 1.19 mmol/L (ref 1.15–1.40)
Calcium, Ion: 1.12 mmol/L — ABNORMAL LOW (ref 1.15–1.40)
Chloride: 100 mmol/L — ABNORMAL LOW (ref 101–111)
Chloride: 101 mmol/L (ref 101–111)
Creatinine, Ser: 0.4 mg/dL — ABNORMAL LOW (ref 0.44–1.00)
Creatinine, Ser: 0.3 mg/dL — ABNORMAL LOW (ref 0.44–1.00)
Creatinine, Ser: 0.3 mg/dL — ABNORMAL LOW (ref 0.44–1.00)
GLUCOSE: 148 mg/dL — AB (ref 65–99)
Glucose, Bld: 197 mg/dL — ABNORMAL HIGH (ref 65–99)
Glucose, Bld: 192 mg/dL — ABNORMAL HIGH (ref 65–99)
HCT: 34 % — ABNORMAL LOW (ref 36.0–46.0)
HEMATOCRIT: 36 % (ref 36.0–46.0)
HEMATOCRIT: 33 % — AB (ref 36.0–46.0)
HEMOGLOBIN: 12.2 g/dL (ref 12.0–15.0)
HEMOGLOBIN: 11.6 g/dL — AB (ref 12.0–15.0)
HEMOGLOBIN: 11.2 g/dL — AB (ref 12.0–15.0)
POTASSIUM: 4.7 mmol/L (ref 3.5–5.1)
Potassium: 4.1 mmol/L (ref 3.5–5.1)
Potassium: 3.9 mmol/L (ref 3.5–5.1)
SODIUM: 139 mmol/L (ref 135–145)
SODIUM: 140 mmol/L (ref 135–145)
Sodium: 138 mmol/L (ref 135–145)
TCO2: 28 mmol/L (ref 0–100)
TCO2: 29 mmol/L (ref 0–100)
TCO2: 28 mmol/L (ref 0–100)

## 2016-07-04 LAB — PROTIME-INR
INR: 1.24
INR: 1.43
PROTHROMBIN TIME: 17.5 s — AB (ref 11.4–15.2)
Prothrombin Time: 15.7 seconds — ABNORMAL HIGH (ref 11.4–15.2)

## 2016-07-04 LAB — POCT I-STAT 3, ART BLOOD GAS (G3+)
BICARBONATE: 26.1 mmol/L (ref 20.0–28.0)
O2 Saturation: 99 %
PCO2 ART: 44.2 mmHg (ref 32.0–48.0)
PH ART: 7.37 (ref 7.350–7.450)
Patient temperature: 35
TCO2: 28 mmol/L (ref 0–100)
pO2, Arterial: 137 mmHg — ABNORMAL HIGH (ref 83.0–108.0)

## 2016-07-04 LAB — POCT I-STAT 4, (NA,K, GLUC, HGB,HCT)
GLUCOSE: 162 mg/dL — AB (ref 65–99)
HCT: 33 % — ABNORMAL LOW (ref 36.0–46.0)
Hemoglobin: 11.2 g/dL — ABNORMAL LOW (ref 12.0–15.0)
POTASSIUM: 4 mmol/L (ref 3.5–5.1)
SODIUM: 142 mmol/L (ref 135–145)

## 2016-07-04 LAB — CBC
HEMATOCRIT: 34.8 % — AB (ref 36.0–46.0)
HEMOGLOBIN: 11.2 g/dL — AB (ref 12.0–15.0)
MCH: 29.6 pg (ref 26.0–34.0)
MCHC: 32.2 g/dL (ref 30.0–36.0)
MCV: 91.8 fL (ref 78.0–100.0)
Platelets: 182 10*3/uL (ref 150–400)
RBC: 3.79 MIL/uL — AB (ref 3.87–5.11)
RDW: 14.7 % (ref 11.5–15.5)
WBC: 8 10*3/uL (ref 4.0–10.5)

## 2016-07-04 LAB — GLUCOSE, CAPILLARY: Glucose-Capillary: 135 mg/dL — ABNORMAL HIGH (ref 65–99)

## 2016-07-04 LAB — APTT
APTT: 33 s (ref 24–36)
aPTT: 35 seconds (ref 24–36)

## 2016-07-04 SURGERY — IMPLANTATION, AORTIC VALVE, TRANSCATHETER, FEMORAL APPROACH
Anesthesia: General | Site: Chest

## 2016-07-04 MED ORDER — ASPIRIN 81 MG PO CHEW: 324.0000 mg | CHEWABLE_TABLET | Freq: Every day | ORAL | Status: DC

## 2016-07-04 MED ORDER — ONDANSETRON HCL 4 MG/2ML IJ SOLN
INTRAMUSCULAR | Status: DC | PRN
  Administered 2016-07-04: 4 mg via INTRAVENOUS

## 2016-07-04 MED ORDER — METOPROLOL TARTRATE 5 MG/5ML IV SOLN
2.5000 mg | INTRAVENOUS | Status: DC | PRN
  Administered 2016-07-05 (×2): 5 mg via INTRAVENOUS
  Administered 2016-07-06: 2.5 mg via INTRAVENOUS
  Filled 2016-07-04 (×3): qty 5

## 2016-07-04 MED ORDER — SUGAMMADEX SODIUM 200 MG/2ML IV SOLN
INTRAVENOUS | Status: DC | PRN
  Administered 2016-07-04: 150 mg via INTRAVENOUS

## 2016-07-04 MED ORDER — PHENYLEPHRINE HCL 10 MG/ML IJ SOLN
0.0000 ug/min | INTRAVENOUS | Status: DC
  Filled 2016-07-04: qty 2

## 2016-07-04 MED ORDER — SUCCINYLCHOLINE CHLORIDE 20 MG/ML IJ SOLN
INTRAMUSCULAR | Status: DC | PRN
  Administered 2016-07-04: 100 mg via INTRAVENOUS

## 2016-07-04 MED ORDER — 0.9 % SODIUM CHLORIDE (POUR BTL) OPTIME
TOPICAL | Status: DC | PRN
  Administered 2016-07-04: 5000 mL

## 2016-07-04 MED ORDER — ONDANSETRON HCL 4 MG/2ML IJ SOLN
INTRAMUSCULAR | Status: AC
  Filled 2016-07-04: qty 2

## 2016-07-04 MED ORDER — CHLORHEXIDINE GLUCONATE 0.12 % MT SOLN
15.0000 mL | OROMUCOSAL | Status: AC
  Administered 2016-07-04: 15 mL via OROMUCOSAL

## 2016-07-04 MED ORDER — PROPOFOL 10 MG/ML IV BOLUS
INTRAVENOUS | Status: AC
  Filled 2016-07-04: qty 40

## 2016-07-04 MED ORDER — ASPIRIN EC 325 MG PO TBEC
325.0000 mg | DELAYED_RELEASE_TABLET | Freq: Every day | ORAL | Status: DC
  Filled 2016-07-04: qty 1

## 2016-07-04 MED ORDER — DIGOXIN 125 MCG PO TABS
0.2500 mg | ORAL_TABLET | Freq: Every day | ORAL | Status: DC
  Administered 2016-07-05 – 2016-07-07 (×3): 0.25 mg via ORAL
  Filled 2016-07-04: qty 2
  Filled 2016-07-04: qty 1
  Filled 2016-07-04 (×2): qty 2

## 2016-07-04 MED ORDER — SODIUM CHLORIDE 0.9 % IV SOLN
INTRAVENOUS | Status: DC | PRN
  Administered 2016-07-04: 1500 mL

## 2016-07-04 MED ORDER — PROPOFOL 10 MG/ML IV BOLUS
INTRAVENOUS | Status: AC
  Filled 2016-07-04: qty 20

## 2016-07-04 MED ORDER — GABAPENTIN 300 MG PO CAPS
300.0000 mg | ORAL_CAPSULE | Freq: Every morning | ORAL | Status: DC
  Administered 2016-07-05 – 2016-07-07 (×3): 300 mg via ORAL
  Filled 2016-07-04 (×3): qty 1

## 2016-07-04 MED ORDER — LACTATED RINGERS IV SOLN
INTRAVENOUS | Status: DC | PRN
  Administered 2016-07-04 (×2): via INTRAVENOUS

## 2016-07-04 MED ORDER — ALBUMIN HUMAN 5 % IV SOLN: 250.0000 mL | INTRAVENOUS | Status: DC | PRN

## 2016-07-04 MED ORDER — IODIXANOL 320 MG/ML IV SOLN
INTRAVENOUS | Status: DC | PRN
  Administered 2016-07-04: 40.4 mL via INTRA_ARTERIAL

## 2016-07-04 MED ORDER — FENTANYL CITRATE (PF) 250 MCG/5ML IJ SOLN
INTRAMUSCULAR | Status: AC
  Filled 2016-07-04: qty 25

## 2016-07-04 MED ORDER — ROCURONIUM BROMIDE 100 MG/10ML IV SOLN
INTRAVENOUS | Status: DC | PRN
  Administered 2016-07-04: 50 mg via INTRAVENOUS

## 2016-07-04 MED ORDER — MIDAZOLAM HCL 10 MG/2ML IJ SOLN
INTRAMUSCULAR | Status: AC
  Filled 2016-07-04: qty 2

## 2016-07-04 MED ORDER — NITROGLYCERIN IN D5W 200-5 MCG/ML-% IV SOLN: 0.0000 ug/min | INTRAVENOUS | Status: DC

## 2016-07-04 MED ORDER — LIDOCAINE HCL (CARDIAC) 20 MG/ML IV SOLN
INTRAVENOUS | Status: DC | PRN
  Administered 2016-07-04: 50 mg via INTRATRACHEAL

## 2016-07-04 MED ORDER — MORPHINE SULFATE (PF) 2 MG/ML IV SOLN: 1.0000 mg | INTRAVENOUS | Status: DC | PRN

## 2016-07-04 MED ORDER — CHLORHEXIDINE GLUCONATE 4 % EX LIQD: 60.0000 mL | Freq: Once | CUTANEOUS | Status: DC

## 2016-07-04 MED ORDER — PROTAMINE SULFATE 10 MG/ML IV SOLN
INTRAVENOUS | Status: DC | PRN
  Administered 2016-07-04: 10 mg via INTRAVENOUS
  Administered 2016-07-04 (×3): 20 mg via INTRAVENOUS

## 2016-07-04 MED ORDER — PHENYLEPHRINE HCL 10 MG/ML IJ SOLN
INTRAVENOUS | Status: DC | PRN
  Administered 2016-07-04: 25 ug/min via INTRAVENOUS

## 2016-07-04 MED ORDER — PANTOPRAZOLE SODIUM 40 MG PO TBEC
40.0000 mg | DELAYED_RELEASE_TABLET | Freq: Every day | ORAL | Status: DC
  Administered 2016-07-06 – 2016-07-07 (×2): 40 mg via ORAL
  Filled 2016-07-04 (×2): qty 1

## 2016-07-04 MED ORDER — VANCOMYCIN HCL IN DEXTROSE 1-5 GM/200ML-% IV SOLN
1000.0000 mg | Freq: Once | INTRAVENOUS | Status: AC
Start: 2016-07-04 — End: 2016-07-04
  Administered 2016-07-04: 1000 mg via INTRAVENOUS
  Filled 2016-07-04: qty 200

## 2016-07-04 MED ORDER — TRAMADOL HCL 50 MG PO TABS: 50.0000 mg | ORAL_TABLET | ORAL | Status: DC | PRN

## 2016-07-04 MED ORDER — DEXTROSE 5 % IV SOLN
1.5000 g | Freq: Two times a day (BID) | INTRAVENOUS | Status: AC
  Administered 2016-07-04 – 2016-07-06 (×4): 1.5 g via INTRAVENOUS
  Filled 2016-07-04 (×5): qty 1.5

## 2016-07-04 MED ORDER — FAMOTIDINE IN NACL 20-0.9 MG/50ML-% IV SOLN
20.0000 mg | Freq: Two times a day (BID) | INTRAVENOUS | Status: AC
  Administered 2016-07-04: 20 mg via INTRAVENOUS
  Filled 2016-07-04: qty 50

## 2016-07-04 MED ORDER — LEVOTHYROXINE SODIUM 50 MCG PO TABS
50.0000 ug | ORAL_TABLET | Freq: Every day | ORAL | Status: DC
  Administered 2016-07-05 – 2016-07-07 (×3): 50 ug via ORAL
  Filled 2016-07-04 (×3): qty 1

## 2016-07-04 MED ORDER — PROTAMINE SULFATE 10 MG/ML IV SOLN
INTRAVENOUS | Status: AC
  Filled 2016-07-04: qty 5

## 2016-07-04 MED ORDER — PHENYLEPHRINE HCL 10 MG/ML IJ SOLN
INTRAMUSCULAR | Status: DC | PRN
  Administered 2016-07-04: 80 ug via INTRAVENOUS
  Administered 2016-07-04 (×6): 40 ug via INTRAVENOUS

## 2016-07-04 MED ORDER — METOPROLOL TARTRATE 25 MG/10 ML ORAL SUSPENSION: 12.5000 mg | Freq: Two times a day (BID) | ORAL | Status: DC

## 2016-07-04 MED ORDER — ORAL CARE MOUTH RINSE
15.0000 mL | Freq: Two times a day (BID) | OROMUCOSAL | Status: DC
  Administered 2016-07-04 (×2): 15 mL via OROMUCOSAL

## 2016-07-04 MED ORDER — CHLORHEXIDINE GLUCONATE 0.12 % MT SOLN: 15.0000 mL | Freq: Once | OROMUCOSAL | Status: DC

## 2016-07-04 MED ORDER — METOPROLOL TARTRATE 12.5 MG HALF TABLET
12.5000 mg | ORAL_TABLET | Freq: Two times a day (BID) | ORAL | Status: DC
  Administered 2016-07-04 (×2): 12.5 mg via ORAL
  Filled 2016-07-04 (×2): qty 1

## 2016-07-04 MED ORDER — GABAPENTIN 300 MG PO CAPS
900.0000 mg | ORAL_CAPSULE | Freq: Every day | ORAL | Status: DC
  Administered 2016-07-04 – 2016-07-06 (×3): 900 mg via ORAL
  Filled 2016-07-04 (×4): qty 3

## 2016-07-04 MED ORDER — ONDANSETRON HCL 4 MG/2ML IJ SOLN: 4.0000 mg | Freq: Four times a day (QID) | INTRAMUSCULAR | Status: DC | PRN

## 2016-07-04 MED ORDER — FENTANYL CITRATE (PF) 100 MCG/2ML IJ SOLN
INTRAMUSCULAR | Status: DC | PRN
  Administered 2016-07-04: 50 ug via INTRAVENOUS
  Administered 2016-07-04: 100 ug via INTRAVENOUS
  Administered 2016-07-04: 50 ug via INTRAVENOUS

## 2016-07-04 MED ORDER — LACTATED RINGERS IV SOLN
500.0000 mL | Freq: Once | INTRAVENOUS | Status: DC | PRN
Start: 2016-07-04 — End: 2016-07-05

## 2016-07-04 MED ORDER — SODIUM CHLORIDE 0.9 % IV SOLN: 1.0000 mL/kg/h | INTRAVENOUS | Status: AC

## 2016-07-04 MED ORDER — PROPOFOL 10 MG/ML IV BOLUS
INTRAVENOUS | Status: DC | PRN
  Administered 2016-07-04: 30 mg via INTRAVENOUS

## 2016-07-04 MED ORDER — SUGAMMADEX SODIUM 200 MG/2ML IV SOLN
INTRAVENOUS | Status: AC
  Filled 2016-07-04: qty 2

## 2016-07-04 MED ORDER — HEPARIN SODIUM (PORCINE) 1000 UNIT/ML IJ SOLN
INTRAMUSCULAR | Status: DC | PRN
  Administered 2016-07-04: 5000 [IU] via INTRAVENOUS

## 2016-07-04 SURGICAL SUPPLY — 105 items
ADAPTER UNIV SWAN GANZ BIP (ADAPTER) ×2 IMPLANT
ADAPTER UNV SWAN GANZ BIP (ADAPTER) ×2
ATTRACTOMAT 16X20 MAGNETIC DRP (DRAPES) IMPLANT
BAG BANDED W/RUBBER/TAPE 36X54 (MISCELLANEOUS) ×4 IMPLANT
BAG DECANTER FOR FLEXI CONT (MISCELLANEOUS) IMPLANT
BAG SNAP BAND KOVER 36X36 (MISCELLANEOUS) ×8 IMPLANT
BLADE 10 SAFETY STRL DISP (BLADE) ×4 IMPLANT
BLADE STERNUM SYSTEM 6 (BLADE) ×4 IMPLANT
BLADE SURG ROTATE 9660 (MISCELLANEOUS) ×4 IMPLANT
CABLE PACING FASLOC BIEGE (MISCELLANEOUS) ×4 IMPLANT
CABLE PACING FASLOC BLUE (MISCELLANEOUS) ×4 IMPLANT
CANISTER SUCTION 2500CC (MISCELLANEOUS) ×4 IMPLANT
CANNULA FEM VENOUS REMOTE 22FR (CANNULA) IMPLANT
CANNULA OPTISITE PERFUSION 16F (CANNULA) IMPLANT
CANNULA OPTISITE PERFUSION 18F (CANNULA) IMPLANT
CATH DIAG EXPO 6F VENT PIG 145 (CATHETERS) ×8 IMPLANT
CATH EXPO 5FR AL1 (CATHETERS) ×4 IMPLANT
CATH S G BIP PACING (SET/KITS/TRAYS/PACK) ×4 IMPLANT
CLIP TI MEDIUM 24 (CLIP) ×4 IMPLANT
CLIP TI WIDE RED SMALL 24 (CLIP) ×4 IMPLANT
CONT SPEC 4OZ CLIKSEAL STRL BL (MISCELLANEOUS) ×24 IMPLANT
COVER DOME SNAP 22 D (MISCELLANEOUS) ×4 IMPLANT
COVER MAYO STAND STRL (DRAPES) ×4 IMPLANT
COVER TABLE BACK 60X90 (DRAPES) ×4 IMPLANT
CRADLE DONUT ADULT HEAD (MISCELLANEOUS) ×4 IMPLANT
DERMABOND ADVANCED (GAUZE/BANDAGES/DRESSINGS) ×2
DERMABOND ADVANCED .7 DNX12 (GAUZE/BANDAGES/DRESSINGS) ×2 IMPLANT
DEVICE CLOSURE PERCLS PRGLD 6F (VASCULAR PRODUCTS) ×4 IMPLANT
DRAPE INCISE IOBAN 66X45 STRL (DRAPES) IMPLANT
DRAPE SLUSH MACHINE 52X66 (DRAPES) ×4 IMPLANT
DRAPE TABLE COVER HEAVY DUTY (DRAPES) ×4 IMPLANT
DRSG TEGADERM 4X4.75 (GAUZE/BANDAGES/DRESSINGS) ×8 IMPLANT
ELECT REM PT RETURN 9FT ADLT (ELECTROSURGICAL) ×8
ELECTRODE REM PT RTRN 9FT ADLT (ELECTROSURGICAL) ×4 IMPLANT
FELT TEFLON 6X6 (MISCELLANEOUS) ×4 IMPLANT
FEMORAL VENOUS CANN RAP (CANNULA) IMPLANT
GAUZE SPONGE 2X2 8PLY STRL LF (GAUZE/BANDAGES/DRESSINGS) ×2 IMPLANT
GAUZE SPONGE 4X4 12PLY STRL (GAUZE/BANDAGES/DRESSINGS) ×4 IMPLANT
GLOVE BIO SURGEON STRL SZ 6.5 (GLOVE) ×6 IMPLANT
GLOVE BIO SURGEON STRL SZ7.5 (GLOVE) ×4 IMPLANT
GLOVE BIO SURGEON STRL SZ8 (GLOVE) ×4 IMPLANT
GLOVE BIO SURGEONS STRL SZ 6.5 (GLOVE) ×2
GLOVE BIOGEL PI IND STRL 7.0 (GLOVE) ×2 IMPLANT
GLOVE BIOGEL PI INDICATOR 7.0 (GLOVE) ×2
GLOVE ECLIPSE 6.5 STRL STRAW (GLOVE) ×4 IMPLANT
GLOVE EUDERMIC 7 POWDERFREE (GLOVE) IMPLANT
GLOVE ORTHO TXT STRL SZ7.5 (GLOVE) ×4 IMPLANT
GOWN STRL REUS W/ TWL LRG LVL3 (GOWN DISPOSABLE) ×6 IMPLANT
GOWN STRL REUS W/ TWL XL LVL3 (GOWN DISPOSABLE) ×8 IMPLANT
GOWN STRL REUS W/TWL LRG LVL3 (GOWN DISPOSABLE) ×6
GOWN STRL REUS W/TWL XL LVL3 (GOWN DISPOSABLE) ×8
GUIDEWIRE SAF TJ AMPL .035X180 (WIRE) ×4 IMPLANT
GUIDEWIRE SAFE TJ AMPLATZ EXST (WIRE) ×4 IMPLANT
GUIDEWIRE STRAIGHT .035 260CM (WIRE) ×4 IMPLANT
INSERT FOGARTY 61MM (MISCELLANEOUS) ×4 IMPLANT
INSERT FOGARTY SM (MISCELLANEOUS) ×8 IMPLANT
INSERT FOGARTY XLG (MISCELLANEOUS) IMPLANT
KIT BASIN OR (CUSTOM PROCEDURE TRAY) ×4 IMPLANT
KIT DILATOR VASC 18G NDL (KITS) IMPLANT
KIT HEART LEFT (KITS) ×4 IMPLANT
KIT ROOM TURNOVER OR (KITS) ×4 IMPLANT
KIT SUCTION CATH 14FR (SUCTIONS) ×8 IMPLANT
NEEDLE PERC 18GX7CM (NEEDLE) ×4 IMPLANT
NS IRRIG 1000ML POUR BTL (IV SOLUTION) ×12 IMPLANT
PACK AORTA (CUSTOM PROCEDURE TRAY) ×4 IMPLANT
PAD ARMBOARD 7.5X6 YLW CONV (MISCELLANEOUS) ×8 IMPLANT
PAD ELECT DEFIB RADIOL ZOLL (MISCELLANEOUS) ×4 IMPLANT
PATCH TACHOSII LRG 9.5X4.8 (VASCULAR PRODUCTS) IMPLANT
PERCLOSE PROGLIDE 6F (VASCULAR PRODUCTS) ×8
SET MICROPUNCTURE 5F STIFF (MISCELLANEOUS) ×4 IMPLANT
SHEATH AVANTI 11CM 8FR (MISCELLANEOUS) ×4 IMPLANT
SHEATH PINNACLE 6F 10CM (SHEATH) ×8 IMPLANT
SLEEVE REPOSITIONING LENGTH 30 (MISCELLANEOUS) ×4 IMPLANT
SPONGE GAUZE 2X2 STER 10/PKG (GAUZE/BANDAGES/DRESSINGS) ×2
SPONGE LAP 4X18 X RAY DECT (DISPOSABLE) ×4 IMPLANT
STOPCOCK MORSE 400PSI 3WAY (MISCELLANEOUS) ×8 IMPLANT
SUT ETHIBOND X763 2 0 SH 1 (SUTURE) ×4 IMPLANT
SUT GORETEX CV 4 TH 22 36 (SUTURE) ×4 IMPLANT
SUT GORETEX CV4 TH-18 (SUTURE) ×12 IMPLANT
SUT GORETEX TH-18 36 INCH (SUTURE) ×8 IMPLANT
SUT MNCRL AB 3-0 PS2 18 (SUTURE) ×4 IMPLANT
SUT PROLENE 3 0 SH1 36 (SUTURE) IMPLANT
SUT PROLENE 4 0 RB 1 (SUTURE)
SUT PROLENE 4-0 RB1 .5 CRCL 36 (SUTURE) IMPLANT
SUT PROLENE 5 0 C 1 36 (SUTURE) IMPLANT
SUT PROLENE 6 0 C 1 30 (SUTURE) IMPLANT
SUT SILK  1 MH (SUTURE) ×2
SUT SILK 1 MH (SUTURE) ×2 IMPLANT
SUT SILK 2 0 SH CR/8 (SUTURE) IMPLANT
SUT VIC AB 2-0 CT1 27 (SUTURE)
SUT VIC AB 2-0 CT1 TAPERPNT 27 (SUTURE) IMPLANT
SUT VIC AB 2-0 CTX 36 (SUTURE) IMPLANT
SUT VIC AB 3-0 SH 8-18 (SUTURE) IMPLANT
SYR 20CC LL (SYRINGE) ×8 IMPLANT
SYR 30ML LL (SYRINGE) ×8 IMPLANT
SYR 50ML LL SCALE MARK (SYRINGE) ×4 IMPLANT
SYRINGE 10CC LL (SYRINGE) ×12 IMPLANT
TOWEL OR 17X26 10 PK STRL BLUE (TOWEL DISPOSABLE) ×12 IMPLANT
TRANSDUCER W/STOPCOCK (MISCELLANEOUS) ×8 IMPLANT
TRAY FOLEY IC TEMP SENS 14FR (CATHETERS) ×4 IMPLANT
TUBE SUCT INTRACARD DLP 20F (MISCELLANEOUS) IMPLANT
TUBING HIGH PRESSURE 120CM (CONNECTOR) ×4 IMPLANT
VALVE HEART TRANSCATH SZ3 23MM (Prosthesis & Implant Heart) ×4 IMPLANT
WIRE AMPLATZ SS-J .035X180CM (WIRE) ×4 IMPLANT
WIRE BENTSON .035X145CM (WIRE) ×4 IMPLANT

## 2016-07-04 NOTE — Op Note (Signed)
[facility]   MULTIDISCIPLINARY HEART VALVE TEAM   TAVR OPERATIVE NOTE   Date of Procedure:  07/04/2016  Preoperative Diagnosis: Severe Aortic Stenosis   Postoperative Diagnosis: Same   Procedure:    Transcatheter Aortic Valve Replacement - Percutaneous Right Transfemoral Approach  Edwards Sapien 3 THV (size 23 mm, model # 9600TFX, serial # [id number])   Co-Surgeons:  Salvatore Decent. Cornelius Moras, MD and Tonny Bollman, MD  Anesthesiologist:  Marcene Duos, MD  Echocardiographer:  Tobias Alexander, MD  Pre-operative Echo Findings:  Severe aortic stenosis  Normal left ventricular systolic function  No thrombus in left atrial appendage  Post-operative Echo Findings:  Mild paravalvular leak  Normal left ventricular systolic function   BRIEF CLINICAL NOTE AND INDICATIONS FOR SURGERY  Patient is an 82 year old female with history of aortic stenosis, chronic persistent atrial fibrillation on warfarin anticoagulation, tachybradycardia syndrome status post single-chamber permanent pacemaker placement, hypertension, hyperlipidemia, and type 2 diabetes mellitus with complications including peripheral neuropathy who has been referred for surgical consultation to discuss treatment options for management of stage D severe symptomatic aortic stenosis. The patient's cardiac history dates back yearly 15 years ago when she first developed atrial fibrillation. She has been chronically anticoagulated using warfarin.  She was followed for many years by Dr. Patty Sermons and more recently by Dr. Jens Som.  Serial echocardiograms have documented the presence of normal left ventricular systolic function with aortic stenosis that has gradually progressed in severity. Most recent echocardiogram performed 01/07/2016 revealed the presence of severe aortic stenosis with peak velocity across the aortic valve measured as high as 5.0 m/s corresponding to mean transvalvular gradient estimated 40 mmHg.  Left  ventricular systolic function was felt to be mildly reduced with ejection fraction estimated 45-50%. There was moderate concentric left ventricular hypertrophy with significant diastolic dysfunction.  The patient was also noted to have mild to moderate regurgitation. Over the past several months the patient has developed symptoms of exertional shortness of breath. She was seen in follow-up by Dr. Jens Som and referred to Dr. Excell Seltzer who performed left heart catheterization on 06/14/2016. The patient was noted to have widely patent coronary arteries with no angiographically significant coronary artery disease. There was severe aortic stenosis with mean transvalvular gradient measured 35 mmHg at catheterization.  Left ventricular systolic function appeared normal and there was only mild mitral regurgitation. Right heart catheterization was not performed.  The patient subsequently underwent CT angiography and has been referred for surgical consultation.  The patient has been seen in consultation and counseled at length regarding the indications, risks and potential benefits of surgery.  All questions have been answered, and the patient provides full informed consent for the operation as described.    DETAILS OF THE OPERATIVE PROCEDURE  PREPARATION:    The patient is brought to the operating room on the above mentioned date and central monitoring was established by the anesthesia team including placement of central venous catheter and radial arterial line. The patient is placed in the supine position on the operating table.  Intravenous antibiotics are administered.  General endotracheal anesthesia is induced uneventfully.  A Foley catheter is placed.  Baseline transesophageal echocardiogram was performed. There was no evidence of any clot in the left atrial appendage.  The patient's chest, abdomen, both groins, and both lower extremities are prepared and draped in a sterile manner. A time out procedure is  performed.   PERIPHERAL ACCESS:    Using the modified Seldinger technique, femoral arterial and venous access was obtained with  placement of 6 Fr sheaths on the left side.  A pigtail diagnostic catheter was passed through the left arterial sheath under fluoroscopic guidance into the aortic root.  A temporary transvenous pacemaker catheter was passed through the left femoral venous sheath under fluoroscopic guidance into the right ventricle.  The pacemaker was tested to ensure stable lead placement and pacemaker capture. Aortic root angiography was performed in order to determine the optimal angiographic angle for valve deployment.   TRANSFEMORAL ACCESS:   Percutaneous transfemoral access and sheath placement was performed by Dr Excell Seltzerooper. Please see his separate operative note for details. The patient was heparinized systemically and ACT verified > 250 seconds.    A 14 Fr transfemoral E-sheath was introduced into the right common femoral artery after progressively dilating over an Amplatz superstiff wire. An AL-1 catheter was used to direct a straight-tip exchange length wire across the native aortic valve into the left ventricle. This was exchanged out for a pigtail catheter and position was confirmed in the LV apex. Simultaneous LV and Ao pressures were recorded.  The pigtail catheter was exchanged for an Amplatz Extra-stiff wire in the LV apex.  Echocardiography was utilized to confirm appropriate wire position and no sign of entanglement in the mitral subvalvular apparatus.   TRANSCATHETER HEART VALVE DEPLOYMENT:   An Edwards Sapien 3 transcatheter heart valve (size 23 mm, model #9600TFX, serial #[id number]) was prepared and crimped per manufacturer's guidelines, and the proper orientation of the valve is confirmed on the [employer] Commander delivery system. The valve was advanced through the introducer sheath using normal technique until in an appropriate position in the abdominal aorta beyond the  sheath tip. The balloon was then retracted and using the fine-tuning wheel was centered on the valve. The valve was then advanced across the aortic arch using appropriate flexion of the catheter. The valve was carefully positioned across the aortic valve annulus. The Commander catheter was retracted using normal technique. Once final position of the valve has been confirmed by angiographic assessment, the valve is deployed while temporarily holding ventilation and during rapid ventricular pacing to maintain systolic blood pressure < 50 mmHg and pulse pressure < 10 mmHg. The balloon inflation is held for >3 seconds after reaching full deployment volume. Once the balloon has fully deflated the balloon is retracted into the ascending aorta and valve function is assessed using echocardiography. There is felt to be trace to mild paravalvular leak and no central aortic insufficiency.  The patient's hemodynamic recovery following valve deployment is good.  The deployment balloon and guidewire are both removed. Echo demostrated acceptable post-procedural gradients, stable mitral valve function, and trace aortic insufficiency.    PROCEDURE COMPLETION:   The sheath was removed and femoral artery closure performed by Dr Excell Seltzerooper. Please see his separate report for details. Protamine was administered once femoral arterial repair was complete. The temporary pacemaker, pigtail catheters and femoral sheaths were removed with manual pressure used for hemostasis.   The patient tolerated the procedure well and is transported to the surgical intensive care in stable condition. There were no immediate intraoperative complications. All sponge instrument and needle counts are verified correct at completion of the operation.   No blood products were administered during the operation.  The patient received a total of 45 mL of intravenous contrast during the procedure.   Purcell Nailslarence H Owen, MD 07/04/2016 9:32 AM

## 2016-07-04 NOTE — Op Note (Signed)
  [facility]   MULTIDISCIPLINARY HEART VALVE TEAM   TAVR OPERATIVE NOTE   Date of Procedure:  07/04/2016  Preoperative Diagnosis: Severe Aortic Stenosis   Postoperative Diagnosis: Same   Procedure:    Transcatheter Aortic Valve Replacement - Percutaneous Transfemoral Approach  Edwards Sapien 3 THV (size 23 mm, model # 9600TFX, serial # [id number])   Co-Surgeons:  Salvatore Decentlarence H. Cornelius Moraswen, MD and Tonny BollmanMichael Ayjah Show, MD  Anesthesiologist:  Dr Sampson GoonFitzgerald  Echocardiographer:  Dr Delton SeeNelson  Pre-operative Echo Findings:  Severe aortic stenosis  Normal left ventricular systolic function  Post-operative Echo Findings:  Trace/Mild paravalvular leak  Normal left ventricular systolic function    BRIEF CLINICAL NOTE AND INDICATIONS FOR SURGERY  Please see the complete operative note of Dr Cornelius Moraswen for full history, indication, and details of operation.   DETAILS OF THE OPERATIVE PROCEDURE  PREPARATION:   The patient is brought to the operating room on the above mentioned date and central monitoring was established by the anesthesia team.  General endotracheal anesthesia is induced uneventfully.  Baseline transesophageal echocardiogram was performed and the LAA is interrogated. CT of the chest had raised the question of LAA thrombus, but this was not seen on TEE. The patient's chest, abdomen, both groins, and both lower extremities are prepared and draped in a sterile manner. A time out procedure is performed.  PERIPHERAL ACCESS:    Using the modified Seldinger technique, femoral arterial and venous access was obtained with placement of 6 Fr sheaths on the left side.  A pigtail diagnostic catheter was passed through the left femoral arterial sheath under fluoroscopic guidance into the aortic root.  A temporary transvenous pacemaker catheter was passed through the left femoral venous sheath under fluoroscopic guidance into the right ventricle.  The pacemaker was tested to ensure stable  lead placement and pacemaker capture. Aortic root angiography was performed in order to determine the optimal angiographic angle for valve deployment.   TRANSFEMORAL ACCESS:  A micropuncture technique is used to access the right femoral artery under fluoroscopic guidance.  Femoral angiography is performed to verify access in the common femoral artery. 2 Perclose devices are deployed at 10' and 2' positions to 'PreClose' the femoral artery. An 8 French sheath is placed and then an Amplatz Superstiff wire is advanced through the sheath. This is changed out for a 14 French transfemoral E-Sheath after progressively dilating over the Superstiff wire.   TRANSCATHETER HEART VALVE DEPLOYMENT:  See complete note of Dr Cornelius Moraswen  PROCEDURE COMPLETION:  The sheath was removed and femoral artery closure is performed using the 2 previously deployed Perclose devices.  Protamine was administered once femoral arterial repair was complete. The temporary pacemaker, pigtail catheters and femoral sheaths were removed with manual pressure used for hemostasis. There was complete hemostasis at the completion of the procedure.  The patient tolerated the procedure well and is transported to the surgical intensive care in stable condition. There were no immediate intraoperative complications. All sponge instrument and needle counts are verified correct at completion of the operation.   The patient received a total of 40 mL of intravenous contrast during the procedure.   Tonny Bollmanooper, Miel Wisener, MD 07/04/2016 4:15 PM

## 2016-07-04 NOTE — Anesthesia Procedure Notes (Signed)
Central Venous Catheter Insertion Performed by: anesthesiologist Patient location: Pre-op. Preanesthetic checklist: patient identified, IV checked, site marked, risks and benefits discussed, surgical consent, monitors and equipment checked, pre-op evaluation, timeout performed and anesthesia consent Lidocaine 1% used for infiltration Landmarks identified Catheter size: 8 Fr Central line was placed.Double lumen Procedure performed using ultrasound guided technique. Attempts: 1 Following insertion, dressing applied and line sutured. Post procedure assessment: blood return through all ports. Patient tolerated the procedure well with no immediate complications.       

## 2016-07-04 NOTE — Telephone Encounter (Signed)
Pt had PTT repeated today at the hospital for TAVR. Lab was normal.

## 2016-07-04 NOTE — Transfer of Care (Signed)
Immediate Anesthesia Transfer of Care Note  Patient: [name]  Procedure(s) Performed: Procedure(s): TRANSCATHETER AORTIC VALVE REPLACEMENT, TRANSFEMORAL (N/A) TRANSESOPHAGEAL ECHOCARDIOGRAM (TEE) (N/A)  Patient Location: ICU  Anesthesia Type:General  Level of Consciousness: awake, alert  and oriented  Airway & Oxygen Therapy: Patient Spontanous Breathing and Patient connected to face mask oxygen  Post-op Assessment: Report given to RN, Post -op Vital signs reviewed and stable and Patient moving all extremities X 4  Post vital signs: Reviewed and stable  Last Vitals:  Vitals:   07/04/16 0916 07/04/16 0920  BP:    Pulse: 76 79  Resp:    Temp:      Last Pain:  Vitals:   07/04/16 0635  TempSrc: Oral         Complications: No apparent anesthesia complications

## 2016-07-04 NOTE — H&P (View-Only) (Signed)
Patient ID: [name], female   DOB: 1926/12/23, 82 y.o.   MRN: [id number]   [facility]  MULTIDISCIPLINARY HEART VALVE CLINIC  CARDIOTHORACIC SURGERY CONSULTATION REPORT  Referring Provider is Crenshaw, Madolyn Frieze, MD PCP is ROBERTS, [name], MD  Chief Complaint  Patient presents with  . Aortic Stenosis    2nd TAVR eval, surgery scheduled for 07/04/16    HPI:  The patient is an 82 year old active woman with a history of chronic persistent atrial fibrillation on Coumadin, tachy-brady syndrome s/p permanent pacer, type 2 DM with peripheral neuropathy, hypertension, hypothyroidism and hyperlipidemia who has severe symptomatic aortic stenosis. She has been followed for chronic atrial fibrillation for about 15 years and has had serial echos that have shown progressive aortic stenosis. Her most recent echo in 01/2016 showed severe AS with a peak gradient of 40 mm Hg and mild to moderate AI. The LVEF was mildly reduced at 45-50% with moderate LVH and diastolic dysfunction. She has remained very active but has developed exertional shortness of breath. She underwent cath as part of her workup for valve replacement and this showed no significant coronary disease. The mean transaortic valve gradient at cath was 35 mm Hg.  She is widowed and lives alone in [place]. She does not have any children but has a niece in [place] whom she is close to and will be coming to stay with her after surgery . She is active and mows her 9 acre property and does gardening. She has not had chest pain or dizziness. She does get swelling in her left leg which has been worked up and attributed to venous insufficiency. She does report intermittent palpitations.  Past Medical History:  Diagnosis Date  . Arthritis   . Chronic diastolic congestive heart failure (HCC)   . Diabetes mellitus   . Dysrhythmia   . History of peptic ulcer disease   . Hypercholesteremia   . Hyperlipidemia   . Hypertension    . Hypothyroidism   . Peripheral neuropathy (HCC)   . Permanent atrial fibrillation (HCC)   . Presence of permanent cardiac pacemaker   . Severe aortic stenosis   . Shortness of breath dyspnea   . Tachycardia-bradycardia syndrome (HCC)    a. s/p SJM single chamber PPM implanted by Dr Reyes Ivan 2009  . Thrombocytopenia (HCC)   . Thyroid disease     Past Surgical History:  Procedure Laterality Date  . ABDOMINAL HYSTERECTOMY    . APPENDECTOMY    . BREAST BIOPSY  02/22/2007    left  . CARDIAC CATHETERIZATION N/A 06/14/2016   Procedure: Left Heart Cath and Coronary Angiography;  Surgeon: Tonny Bollman, MD;  Location: [facility] INVASIVE CV LAB;  Service: Cardiovascular;  Laterality: N/A;  . CARDIOVERSION  05/16/2005   Electrical cardioversion   . ESOPHAGOGASTRODUODENOSCOPY  03/12/2007    Esophagogastroduodenoscopy with control of bleeding  . PACEMAKER INSERTION  06/18/2008   SJM Zephyr XL SR implanted by Dr Reyes Ivan  . SHOULDER SURGERY  12/07/2004    Family History  Problem Relation Age of Onset  . Heart disease Father   . Emphysema Father   . Heart failure Father   . Coronary artery disease Father   . Stroke Sister     cerebral hemorrhage  . Breast cancer Sister   . Heart disease Sister   . Heart failure Sister     Social History   Social History  . Marital status: Married    Spouse name: N/A  .  Number of children: N/A  . Years of education: N/A   Occupational History  . Not on file.   Social History Main Topics  . Smoking status: Former Smoker    Packs/day: 1.00    Years: 10.00    Types: Cigarettes    Quit date: 03/06/1961  . Smokeless tobacco: Never Used  . Alcohol use No  . Drug use: No  . Sexual activity: Not on file   Other Topics Concern  . Not on file   Social History Narrative  . No narrative on file    Current Outpatient Prescriptions  Medication Sig Dispense Refill  . amoxicillin (AMOXIL) 500 MG capsule Take 500 mg by mouth daily as needed (for dental  appointments).     . calcium carbonate (OS-CAL) 600 MG TABS Take 1,200 mg by mouth 3 (three) times a week.     . digoxin (LANOXIN) 0.25 MG tablet Take 1 tablet (0.25 mg total) by mouth daily. 120 tablet 0  . diltiazem (CARTIA XT) 240 MG 24 hr capsule Take 1 capsule (240 mg total) by mouth daily. 90 capsule 3  . doxycycline (VIBRA-TABS) 100 MG tablet Take 1 tablet (100 mg total) by mouth 2 (two) times daily. 28 tablet 0  . enoxaparin (LOVENOX) 100 MG/ML injection Inject 95 mg into the skin daily.     . furosemide (LASIX) 40 MG tablet Take ONE tablet by mouth every other day alternating with TWO tablets every other day. (Patient taking differently: Take 40-80 mg by mouth See admin instructions. Takes 40 mg by mouth every other day alternating with 80 mg the other days.) 135 tablet 3  . gabapentin (NEURONTIN) 300 MG capsule Take 300-900 mg by mouth See admin instructions. Take 300 mg by mouth in the morning and take 900 mg by mouth in the evening.    Marland Kitchen levothyroxine (SYNTHROID, LEVOTHROID) 50 MCG tablet Take 50 mcg by mouth daily.      Marland Kitchen lisinopril (PRINIVIL,ZESTRIL) 2.5 MG tablet Take 2.5 mg by mouth daily.    . metFORMIN (GLUCOPHAGE) 500 MG tablet Take 1,000 mg by mouth 2 (two) times daily.     . metoprolol (LOPRESSOR) 100 MG tablet Take 1 tablet (100 mg total) by mouth 2 (two) times daily. 180 tablet 0  . Multiple Vitamins-Minerals (VISION-VITE PRESERVE PO) Take 1 capsule by mouth 2 (two) times daily.    . psyllium (METAMUCIL) 58.6 % powder Take 1 packet by mouth daily.    . simvastatin (ZOCOR) 20 MG tablet TAKE ONE-HALF TABLET BY MOUTH ONCE DAILY (Patient taking differently: Take 10 mg by mouth once daily) 30 tablet 10  . warfarin (COUMADIN) 5 MG tablet TAKE ONE-HALF TO ONE TABLET BY MOUTH ONCE DAILY (Patient taking differently: Take 2.5 mg by mouth daily. ) 30 tablet 3   No current facility-administered medications for this visit.     Allergies  Allergen Reactions  . Amiodarone Other (See  Comments)    Stops her heart  . Aspirin Other (See Comments)    Gi bleeding  . Codeine Other (See Comments)    GI Lead  . Tramadol Nausea And Vomiting  . Lipitor [Atorvastatin Calcium] Other (See Comments)    Muscle Ache  . Pravachol Other (See Comments)    Muscle Ache      Review of Systems:              General:  normal appetite, normal energy, no weight gain, no weight loss, no fever             Cardiac:                       no chest pain with exertion, no chest pain at rest, + SOB with exertion, no resting SOB, no PND, + orthopnea, + palpitations, + arrhythmia, + atrial fibrillation, + LE edema, no dizzy spells, no syncope             Respiratory:                 + shortness of breath, no home oxygen, no productive cough, intermittent dry cough, no bronchitis, no wheezing, no hemoptysis, no asthma, no pain with inspiration or cough, no sleep apnea, no CPAP at night             GI:                               no difficulty swallowing, no reflux, no frequent heartburn, no hiatal hernia, no abdominal pain, no constipation, no diarrhea, no hematochezia, no hematemesis, no melena             GU:                              no dysuria,  no frequency, no urinary tract infection, no hematuria, no kidney stones, no kidney disease             Vascular:                     no pain suggestive of claudication, + pain in feet, no leg cramps, + varicose veins, no DVT, no non-healing foot ulcer             Neuro:                         no stroke, no TIA's, no seizures, no headaches, no temporary blindness one eye,  no slurred speech, + peripheral neuropathy, no chronic pain, mild instability of gait, no memory/cognitive dysfunction             Musculoskeletal:         mild arthritis, no joint swelling, no myalgias, no difficulty walking, normal mobility              Skin:                            no rash, no itching, no skin infections, no pressure sores or ulcerations              Psych:                         no anxiety, no depression, no nervousness, no unusual recent stress             Eyes:                           no blurry vision, no floaters, no recent vision changes, + wears glasses or contacts             ENT:  no hearing loss, no loose or painful teeth, no dentures, last saw dentist August 2017             Hematologic:               + easy bruising, no abnormal bleeding, no clotting disorder, no frequent epistaxis             Endocrine:                   + diabetes, does check CBG's at home                              Physical Exam:   BP 120/74 (BP Location: Left Arm, Patient Position: Sitting, Cuff Size: Normal)   Pulse 67   Resp 20   Ht 5\' 6"  (1.676 m)   Wt 148 lb (67.1 kg)   SpO2 98%   BMI 23.89 kg/m   General:  Elderly but  well-appearing, looks younger than her age.  HEENT:  Unremarkable , NCAT, PERLA, EOMI, teeth in fair condition  Neck:   no JVD, no bruits, no adenopathy or thyromegaly  Chest:   clear to auscultation, symmetrical breath sounds, no wheezes, no rhonchi   CV:   RRR, grade III/VI crescendo/decrescendo murmur heard best at RSB,   no diastolic murmur  Abdomen:  soft, non-tender, no masses or organomegaly  Extremities:  warm, well-perfused, pulses palpable in feet, mild edema in the left leg.  Rectal/GU  Deferred  Neuro:   Grossly non-focal and symmetrical throughout  Skin:   Clean and dry, no rashes, no breakdown   Diagnostic Tests:  *[facility] at [facility]                  [address]                        [place], [place] [address]                            [phone]  ------------------------------------------------------------------- Transthoracic Echocardiography  Patient:    Kashmir, Leedy MR #:       [id number] Study Date: 01/07/2016 Gender:     F Age:        59 Height:     167.6 cm Weight:     66.7 kg BSA:        1.77 m^2 Pt.  Status: Room:   Evorn Gong Crenshaw  REFERRING    [name]  SONOGRAPHER  [address] RVT, RDCS  ATTENDING    Zoila Shutter MD  PERFORMING   [facility], Outpatient  cc:  ------------------------------------------------------------------- LV EF: 45% -   50%  ------------------------------------------------------------------- History:   PMH:  Acquired from the patient and from the patient&'s chart.  Atrial fibrillation.  Risk factors:  Former tobacco use. Hypertension. Diabetes mellitus.  ------------------------------------------------------------------- Study Conclusions  - Left ventricle: The cavity size was normal. There was moderate   concentric hypertrophy. Systolic function was mildly reduced. The   estimated ejection fraction was in the range of 45% to 50%. Mild   diffuse hypokinesis with no identifiable regional variations. - Aortic valve: Valve mobility was severely restricted. There was   severe stenosis. There was mild to moderate regurgitation   directed centrally in the LVOT. Valve area (VTI): 0.9 cm^2. - Mitral valve: Calcified  annulus. Moderately thickened, mildly   calcified leaflets . There was mild to moderate regurgitation   directed centrally. Valve area by continuity equation (using LVOT   flow): 1.93 cm^2. - Left atrium: The atrium was moderately dilated. - Right ventricle: Systolic function was mildly reduced. - Right atrium: The atrium was moderately dilated. - Tricuspid valve: There was mild-moderate regurgitation directed   centrally. - Pulmonary arteries: Systolic pressure was mildly increased. PA   peak pressure: 37 mm Hg (S).  Transthoracic echocardiography.  M-mode, complete 2D, spectral Doppler, and color Doppler.  Birthdate:  Patient birthdate: March 02, 1927.  Age:  Patient is 82 yr old.  Sex:  Gender: female. BMI: 23.7 kg/m^2.  Blood pressure:     106/70  Patient status: Outpatient.  Study date:  Study date: 01/07/2016. Study  time: 11:15 AM.  -------------------------------------------------------------------  ------------------------------------------------------------------- Left ventricle:  The cavity size was normal. There was moderate concentric hypertrophy. Systolic function was mildly reduced. The estimated ejection fraction was in the range of 45% to 50%.  Mild diffuse hypokinesis with no identifiable regional variations. The study was not technically sufficient to allow evaluation of LV diastolic dysfunction due to atrial fibrillation.  ------------------------------------------------------------------- Aortic valve:   Trileaflet; severely thickened, severely calcified leaflets. Valve mobility was severely restricted.  Doppler:   There was severe stenosis.   There was mild to moderate regurgitation directed centrally in the LVOT.    VTI ratio of LVOT to aortic valve: 0.3. Valve area (VTI): 0.9 cm^2. Indexed valve area (VTI): 0.51 cm^2/m^2. Peak velocity ratio of LVOT to aortic valve: 0.26. Indexed valve area (Vmax): 0.43 cm^2/m^2. Mean velocity ratio of LVOT to aortic valve: 0.3. Indexed valve area (Vmean): 0.5 cm^2/m^2.    Mean gradient (S): 40 mm Hg. Peak gradient (S): 100 mm Hg.  ------------------------------------------------------------------- Aorta:  Aortic root: The aortic root was normal in size. Ascending aorta: The ascending aorta was normal in size.  ------------------------------------------------------------------- Mitral valve:   Calcified annulus. Moderately thickened, mildly calcified leaflets .  Doppler:   There was no evidence for stenosis.   There was mild to moderate regurgitation directed centrally.    Valve area by continuity equation (using LVOT flow): 1.93 cm^2. Indexed valve area by continuity equation (using LVOT flow): 1.09 cm^2/m^2.    Mean gradient (D): 3 mm Hg. Peak gradient (D): 13 mm  Hg.  ------------------------------------------------------------------- Left atrium:  The atrium was moderately dilated.  ------------------------------------------------------------------- Right ventricle:  The cavity size was normal. Systolic function was mildly reduced.  ------------------------------------------------------------------- Pulmonic valve:    Structurally normal valve.   Cusp separation was normal.  Doppler:  Transvalvular velocity was within the normal range. There was mild regurgitation.  ------------------------------------------------------------------- Tricuspid valve:   Structurally normal valve.   Leaflet separation was normal.  Doppler:  Transvalvular velocity was within the normal range. There was mild-moderate regurgitation directed centrally.   ------------------------------------------------------------------- Pulmonary artery:   Systolic pressure was mildly increased.  ------------------------------------------------------------------- Right atrium:  The atrium was moderately dilated.  ------------------------------------------------------------------- Pericardium:  There was no pericardial effusion.  ------------------------------------------------------------------- Measurements   Left ventricle                            Value          Reference  LV ID, ED, PLAX chordal                   47.78 mm       43 - 52  LV ID, ES, PLAX chordal           (H)     38.16 mm       23 - 38  LV fx shortening, PLAX chordal    (L)     20    %        >=29  LV PW thickness, ED                       11.38 mm       ---------  IVS/LV PW ratio, ED                       1.05           <=1.3  Stroke volume, 2D                         65    ml       ---------  Stroke volume/bsa, 2D                     37    ml/m^2   ---------  LV filling time, D, DP                    680   ms       ---------  LV e&', lateral                            4.68  cm/s     ---------   LV E/e&', lateral                          38.89          ---------  LV e&', medial                             4.16  cm/s     ---------  LV E/e&', medial                           43.75          ---------  LV e&', average                            4.42  cm/s     ---------  LV E/e&', average                          41.18          ---------    Ventricular septum                        Value          Reference  IVS thickness, ED                         11.99 mm       ---------    LVOT                                      Value  Reference  LVOT ID, S                                19.4  mm       ---------  LVOT area                                 2.96  cm^2     ---------  LVOT peak velocity, S                     130   cm/s     ---------  LVOT mean velocity, S                     86.2  cm/s     ---------  LVOT VTI, S                               28.5  cm       ---------  LVOT peak gradient, S                     7     mm Hg    ---------  Stroke volume (SV), LVOT DP               84.2  ml       ---------  Stroke index (SV/bsa), LVOT DP            47.6  ml/m^2   ---------    Aortic valve                              Value          Reference  Aortic valve peak velocity, S             501   cm/s     ---------  Aortic valve mean velocity, S             289   cm/s     ---------  Aortic valve VTI, S                       93.9  cm       ---------  Aortic mean gradient, S                   40    mm Hg    ---------  Aortic peak gradient, S                   100   mm Hg    ---------  VTI ratio, LVOT/AV                        0.3            ---------  Aortic valve area, VTI                    0.9   cm^2     ---------  Aortic valve area/bsa, VTI                0.51  cm^2/m^2 ---------  Velocity ratio, peak, LVOT/AV  0.26           ---------  Aortic valve area/bsa, peak               0.43  cm^2/m^2 ---------  velocity  Velocity ratio, mean, LVOT/AV             0.3             ---------  Aortic valve area/bsa, mean               0.5   cm^2/m^2 ---------  velocity  Aortic regurg pressure half-time          434   ms       ---------    Aorta                                     Value          Reference  Aortic root ID, ED                        31    mm       ---------    Left atrium                               Value          Reference  LA ID, A-P, ES                            46    mm       ---------  LA ID/bsa, A-P                    (H)     2.6   cm/m^2   <=2.2  LA volume, S                              102   ml       ---------  LA volume/bsa, S                          57.7  ml/m^2   ---------  LA volume, ES, 1-p A4C                    112   ml       ---------  LA volume/bsa, ES, 1-p A4C                63.3  ml/m^2   ---------  LA volume, ES, 1-p A2C                    88    ml       ---------  LA volume/bsa, ES, 1-p A2C                49.8  ml/m^2   ---------    Mitral valve                              Value          Reference  Mitral E-wave peak velocity  182   cm/s     ---------  Mitral mean velocity, D                   65.01 cm/s     ---------  Mitral deceleration time          (L)     148   ms       150 - 230  Mitral mean gradient, D                   3     mm Hg    ---------  Mitral peak gradient, D                   13    mm Hg    ---------  Mitral valve area, LVOT                   1.93  cm^2     ---------  continuity  Mitral valve area/bsa, LVOT               1.09  cm^2/m^2 ---------  continuity  Mitral annulus VTI, D                     45.7  cm       ---------  Mitral maximal regurg velocity,           640   cm/s     ---------  PISA  Mitral regurg VTI, PISA                   215   cm       ---------  Mitral ERO, PISA                          0.15  cm^2     ---------  Mitral regurg volume, PISA                32    ml       ---------  Mitral regurg fraction, PISA              27.53 %        ---------    Pulmonary arteries                         Value          Reference  PA pressure, S, DP                (H)     37    mm Hg    <=30    Tricuspid valve                           Value          Reference  Tricuspid regurg peak velocity            271   cm/s     ---------  Tricuspid peak RV-RA gradient             29    mm Hg    ---------    Systemic veins                            Value          Reference  Estimated CVP                             8     mm Hg    ---------    Right ventricle                           Value          Reference  RV pressure, S, DP                (H)     37    mm Hg    <=30    Pulmonic valve                            Value          Reference  Pulmonic valve peak velocity, S           64.3  cm/s     ---------  Pulmonic regurg velocity, ED              82.9  cm/s     ---------  Legend: (L)  and  (H)  mark values outside specified reference range.  ------------------------------------------------------------------- Prepared and Electronically Authenticated by  Thurmon Fair, MD 2017-04-14T14:05:24   Panel Physicians Referring Physician Case Authorizing Physician  Tonny Bollman, MD (Primary)    Procedures   Left Heart Cath and Coronary Angiography  Conclusion     The left ventricular systolic function is normal.  The left ventricular ejection fraction is 55-65% by visual estimate.  There is mild (2+) mitral regurgitation.  There is severe aortic valve stenosis. There is trivial (1+) aortic regurgitation.   1. Widely patent coronary arteries with no angiographic disease in the LAD, left main, or right coronary artery and minimal irregularity in the left circumflex 2. Calcified aortic valve with restricted leaflet motion and severe aortic stenosis with a mean gradient of 35 mmHg 3. Normal LV systolic function with mild mitral regurgitation by ventriculography 4. Normal aortic root without evidence of aneurysm, estimated angle for valve deployment LAO 5, caudal  5 5. Aborted right heart catheterization (estimated PASP by echo 37 mmHg)  Cardiac surgical evaluation for further assessment of treatment options for severe symptomatic aortic stenosis.  If CTA studies indicate suitable anatomy, pt likely will be a good candidate for TAVR.  Resume warfarin today   Indications   Severe aortic stenosis [I35.0 (ICD-10-CM)]  Procedural Details/Technique   Technical Details INDICATION: Severe symptomatic aortic stenosis  PROCEDURAL DETAILS: There was an indwelling IV in a right antecubital vein. Using normal sterile technique, the IV was changed out for a 5 Fr brachial sheath over a 0.018 inch wire. The right wrist was then prepped, draped, and anesthetized with 1% lidocaine. Using the modified Seldinger technique a 5/6 French Slender sheath was placed in the right radial artery. Intra-arterial verapamil was administered through the radial artery sheath. IV heparin was administered after a JR4 catheter was advanced into the central aorta. I tried to advance a Swan-Ganz catheter from the right antecubital vein. I could advance it to the innominate vein but no further than that. I was able to advance a 0.025 inch guidewire into the right atrium without difficulty, but the catheter would not track over it. The right heart catheterization was eventually aborted. Standard Judkins catheters were used for selective coronary angiography, aortic  root angiography, and left ventriculography. There were no immediate procedural complications. The patient was transferred to the post catheterization recovery area for further monitoring.  During this procedure the patient is administered a total of Versed 1 mg and Fentanyl 25 mg to achieve and maintain moderate conscious sedation. The patient's heart rate, blood pressure, and oxygen saturation are monitored continuously during the procedure. The period of conscious sedation is 26 minutes, of which I was present face-to-face 100% of  this time.   Estimated blood loss <50 mL. .    Coronary Findings   Dominance: Right  Left Anterior Descending  Vessel is angiographically normal.  Left Circumflex  The vessel exhibits minimal luminal irregularities.  Right Coronary Artery  Vessel is angiographically normal.  Wall Motion              Left Heart   Left Ventricle The left ventricular size is normal. The left ventricular systolic function is normal. The left ventricular ejection fraction is 55-65% by visual estimate. No regional wall motion abnormalities. There is mild (2+) mitral regurgitation.    Aortic Valve There is severe aortic valve stenosis. There is trivial (1+) aortic regurgitation. The aortic valve is calcified. There is restricted aortic valve motion. The aortic valve is calcified with restricted motion. The mean transvalvular gradient is 35 mmHg. There is 1+ aortic insufficiency    Coronary Diagrams   Diagnostic Diagram     Implants     No implant documentation for this case.  PACS Images   Show images for Cardiac catheterization   Link to Procedure Log   Procedure Log    Hemo Data   Flowsheet Row Most Recent Value  Aortic Mean Gradient 35.4 mmHg  Aortic Peak Gradient 44 mmHg  AO Systolic Pressure 125 mmHg  AO Diastolic Pressure 50 mmHg  AO Mean 78 mmHg  LV Systolic Pressure 159 mmHg  LV Diastolic Pressure 4 mmHg  LV EDP 11 mmHg  Arterial Occlusion Pressure Extended Systolic Pressure 123 mmHg  Arterial Occlusion Pressure Extended Diastolic Pressure 50 mmHg  Arterial Occlusion Pressure Extended Mean Pressure 79 mmHg  Left Ventricular Apex Extended Systolic Pressure 167 mmHg  Left Ventricular Apex Extended Diastolic Pressure 5 mmHg  Left Ventricular Apex Extended EDP Pressure 14 mmHg      ADDENDUM REPORT: 06/27/2016 07:57  ADDENDUM: Please see separate dictation for contemporaneously obtained CTA of the chest, abdomen and pelvis obtained 06/23/2016 for full description of  important extracardiac findings.   Electronically Signed   By: Trudie Reed M.D.   On: 06/27/2016 07:57   Addended by Florencia Reasons, MD on 06/27/2016 8:00 AM    Study Result   MEDICATIONS: None  EXAM: Cardiac TAVR CT  TECHNIQUE: The patient was scanned on a Philips 256 scanner. A 120 kV retrospective scan was triggered in the descending thoracic aorta at 111 HU's. Gantry rotation speed was 270 msecs and collimation was .9 mm. No beta blockade or nitro were given. The 3D data set was reconstructed in 5% intervals of the R-R cycle. Systolic and diastolic phases were analyzed on a dedicated work station using MPR, MIP and VRT modes. The patient received 80 cc of contrast.  Medications:  None  FINDINGS: Aortic Valve: Calcified trileaflet. Nodular calcification of annulus at base of left and non coronary cusps.  Aorta: Mild calcification of the inferior surface of the root, and arch. Normal arch vessel take off  Sinotubular Junction:  28 mm  Ascending Thoracic Aorta:  32 mm  Aortic Arch:  26 mm  Descending Thoracic Aorta:  22 mm  Sinus of Valsalva Measurements:  Non-coronary:  31 mm  Right -coronary:  32 mm  Left -coronary:  32 mm  Coronary Artery Height above Annulus:  Left Main:  16 mm  Right Coronary:  16.6 mm  Virtual Basal Annulus Measurements:  Maximum/Minimum Diameter:  20 mm x 26.4 mm  Perimeter:  79 mm  Area:  440 mm2  Coronary Arteries:  Sufficient height above annulus for deployment  Optimum Fluoroscopic Angle for Delivery:  LAO 13 degrees  IMPRESSION: 1) Calcified trileaflet aortic valve with an arnnular area of 440 mm2 suitable for a 26 mm Sapien 3 valve  2) Nodular calcification of annulus at base of non and left cusps  3) Suitable coronary height for deployment  4) Optimum angiographic angle for deployment LAO 13 degrees  5) Severe biatrial enlargement with likely LAA thrombus and  pacer system seen on RA/RV  Charlton Haws  Electronically Signed: By: Charlton Haws M.D. On: 06/23/2016 10:07       CLINICAL DATA:  82 year old female with history of severe aortic stenosis. Preprocedural study prior to potential transcatheter aortic valve replacement (TAVR) procedure.  EXAM: CT ANGIOGRAPHY CHEST, ABDOMEN AND PELVIS  TECHNIQUE: Multidetector CT imaging through the chest, abdomen and pelvis was performed using the standard protocol during bolus administration of intravenous contrast. Multiplanar reconstructed images and MIPs were obtained and reviewed to evaluate the vascular anatomy.  CONTRAST:  70 mL of Isovue 370.  COMPARISON:  No priors.  FINDINGS: CTA CHEST FINDINGS  Mediastinum/Lymph Nodes: Heart size is enlarged with left atrial dilatation. There is a filling defect in the tip of the left atrial appendage, concerning for left atrial appendage thrombus (image 72 of series 401). There is no significant pericardial fluid, thickening or pericardial calcification. Atherosclerosis of the thoracic aorta, without evidence of aneurysm or dissection. No definite coronary artery calcifications. Severe thickening calcifications of the aortic valve. Thickening calcification of the mitral-aortic intervalvular fibrosa, as well as the mitral valve and annulus. Left-sided pacemaker device in place with lead tip terminating in the right ventricle along the mid interventricular septum. No pathologically enlarged mediastinal or hilar lymph nodes. Esophagus is unremarkable in appearance. No axillary lymphadenopathy.  Lungs/Pleura: No suspicious appearing pulmonary nodules or masses. No acute consolidative airspace disease. No pleural effusions.  Musculoskeletal/Soft Tissues: There are no aggressive appearing lytic or blastic lesions noted in the visualized portions of the skeleton.  CTA ABDOMEN AND PELVIS FINDINGS  Hepatobiliary: No suspicious  cystic or solid hepatic lesions. No intra or extrahepatic biliary ductal dilatation. Gallbladder is normal in appearance.  Pancreas: No pancreatic mass. No pancreatic ductal dilatation. No pancreatic or peripancreatic fluid or inflammatory changes.  Spleen: Unremarkable.  Adrenals/Urinary Tract: Sub cm low-attenuation lesion in the medial aspect of the upper pole the left kidney is too small to definitively characterize, but is statistically likely a tiny cyst. Right kidney and bilateral adrenal glands are normal in appearance. No hydroureteronephrosis. Urinary bladder is normal in appearance.  Stomach/Bowel: The appearance of the stomach is normal. There is no pathologic dilatation of small bowel or colon. Numerous colonic diverticulae are noted, without surrounding inflammatory changes to suggest an acute diverticulitis at this time. The appendix is not confidently identified and may be surgically absent. Regardless, there are no inflammatory changes noted adjacent to the cecum to suggest the presence of an acute appendicitis at this time.  Vascular/Lymphatic: Vascular findings and measurements pertinent to potential TAVR procedure,  as detailed below. Celiac axis, superior mesenteric artery and inferior mesenteric artery are all widely patent without evidence of hemodynamically significant stenosis. Single renal arteries bilaterally are widely patent. No lymphadenopathy noted in the abdomen or pelvis.  Reproductive: Status post hysterectomy. Ovaries are not confidently identified may be surgically absent or atrophic.  Other: No significant volume of ascites.  No pneumoperitoneum.  Musculoskeletal: There are no aggressive appearing lytic or blastic lesions noted in the visualized portions of the skeleton.  VASCULAR MEASUREMENTS PERTINENT TO TAVR:  AORTA:  Minimal Aortic Diameter -  12 x 14 mm  Severity of Aortic Calcification -  moderate  RIGHT  PELVIS:  Right Common Iliac Artery -  Minimal Diameter - 6.4 x 10.3 mm  Tortuosity - mild  Calcification - moderate  Right External Iliac Artery -  Minimal Diameter - 6.7 x 6.1 mm  Tortuosity - mild  Calcification - minimal  Right Common Femoral Artery -  Minimal Diameter - 5.8 x 7.0 mm  Tortuosity - mild  Calcification - mild  LEFT PELVIS:  Left Common Iliac Artery -  Minimal Diameter - 6.8 x 8.2 mm  Tortuosity - moderate  Calcification - moderate  Left External Iliac Artery -  Minimal Diameter - 6.7 x 6.1 mm  Tortuosity - mild  Calcification - none  Left Common Femoral Artery -  Minimal Diameter - 5.0 x 6.9 mm  Tortuosity - mild  Calcification - mild  Review of the MIP images confirms the above findings.  IMPRESSION: 1. Vascular findings and measurements pertinent to potential TAVR procedure, as detailed above. This patient appears to have suitable pelvic arterial access bilaterally. 2. Severe thickening calcification of the aortic valve, compatible with the reported clinical history of severe aortic stenosis. There is also extensive calcification of the mitral-aortic intervalvular fibrosa, and thickening calcification of the mitral valve and annulus. 3. Cardiomegaly with left atrial dilatation, and small filling defect in the tip of the left atrial appendage concerning for left atrial appendage thrombus. 4. Colonic diverticulosis without evidence of acute diverticulitis at this time. 5. Additional incidental findings, as above.   Electronically Signed   By: Trudie Reed M.D.   On: 06/27/2016 09:58    Impression:  This 82 year old woman has stage D severe symptomatic aortic stenosis with a several month history of exertional shortness of breath.  I have personally reviewed and interpreted her most recent echocardiogram,  cardiac catheterization, and CT angiograms. Echocardiogram performed in 01/2016 shows  severe aortic stenosis with a trileaflet valve with severe calcification, thickening, and restricted leaflet mobility involving all 3 leaflets of the valve. Mean transvalvular gradient is  40 mmHg. Left ventricular systolic function remains preserved. There was mild to moderate mitral regurgitation. Cardiac cath shows absence of significant coronary artery disease and confirms the presence of severe aortic stenosis with a mean gradient of 35 mm Hg. I agree that TAVR is the best option for this 82 year old woman who is still active. It will allow her to remain active with less operative risk and a much quicker recovery. Her gated cardiac CT shows that her annulus is adequate size for a 26 mm Sapien 3 valve. There is some calcification of the intervalvular fibrosa and nodular calcifications in the annulus at the base of the non and left coronary cusps which could increase the risk of paravalvular leak. There is also a question of a left atrial thrombus but this is small and may be trabeculations. She has been therapeutic on  chronic Coumadin therapy. The patient is interested in proceeding with transcatheter aortic valve replacement as soon as possible as an alternative to conventional surgical aortic valve replacement. She understands that we will perform TEE first and possibly delay TAVR if there is clear evidence for clot in the left atrial appendage.  The patient has been advised of a variety of complications that might develop including but not limited to risks of death, stroke, paravalvular leak, aortic dissection or other major vascular complications, aortic annulus rupture, device embolization, cardiac rupture or perforation, mitral regurgitation, acute myocardial infarction, arrhythmia, heart block or bradycardia requiring permanent pacemaker placement, congestive heart failure, respiratory failure, renal failure, pneumonia, infection, other late complications related to structural valve deterioration or  migration, or other complications that might ultimately cause a temporary or permanent loss of functional independence or other long term morbidity.  The patient provides full informed consent for the procedure as described and all questions were answered.  Plan:  She has already stopped her Coumadin and started Lovenox injections today for bridge. She will stop Metformin 2 days prior to surgery. She will return Tuesday next week for TAVR surgery, assuming there is no significant clot in the LAA on TEE.  I spent 40 minutes performing this consultation and > 50% of this time was spent face to face counseling and coordinating the care of this patient's severe symptomatic aortic stenosis.  Alleen BorneBryan K. Cailey Trigueros, MD 06/29/2016

## 2016-07-04 NOTE — Interval H&P Note (Signed)
History and Physical Interval Note:  07/04/2016 7:22 AM  Loma BostonMillie H Arel  has presented today for surgery, with the diagnosis of SEVERE AS  The various methods of treatment have been discussed with the patient and family. After consideration of risks, benefits and other options for treatment, the patient has consented to  Procedure(s): TRANSCATHETER AORTIC VALVE REPLACEMENT, TRANSFEMORAL (N/A) TRANSESOPHAGEAL ECHOCARDIOGRAM (TEE) (N/A) as a surgical intervention .  The patient's history has been reviewed, patient examined, no change in status, stable for surgery.  I have reviewed the patient's chart and labs.  Questions were answered to the patient's satisfaction.    Plan for TEE imaging to evaluate for LAA thrombus and proceed with TAVR if LAA is clear. D/W patient and family. Plan for right transfemoral TAVR via percutaneous approach.  Tonny Bollmanooper, Gustavo Meditz

## 2016-07-04 NOTE — Progress Notes (Signed)
TCTS BRIEF SICU PROGRESS NOTE  Day of Surgery  S/P Procedure(s) (LRB): TRANSCATHETER AORTIC VALVE REPLACEMENT, TRANSFEMORAL (N/A) TRANSESOPHAGEAL ECHOCARDIOGRAM (TEE) (N/A)   Doing well.  Up in chair.  Denies any pain or SOB Afib w/ stable HR and BP O2 sats 96% on RA Both groins okay UOP adequate  Plan: Continue routine post TAVR  Purcell Nailslarence H Leauna Sharber, MD 07/04/2016 6:08 PM

## 2016-07-04 NOTE — Anesthesia Postprocedure Evaluation (Signed)
Anesthesia Post Note  Patient: [name]  Procedure(s) Performed: Procedure(s) (LRB): TRANSCATHETER AORTIC VALVE REPLACEMENT, TRANSFEMORAL (N/A) TRANSESOPHAGEAL ECHOCARDIOGRAM (TEE) (N/A)  Patient location during evaluation: SICU Anesthesia Type: General Level of consciousness: awake Pain management: pain level controlled Vital Signs Assessment: post-procedure vital signs reviewed and stable Respiratory status: spontaneous breathing, nonlabored ventilation, respiratory function stable and patient connected to nasal cannula oxygen Cardiovascular status: stable Anesthetic complications: no    Last Vitals:  Vitals:   07/04/16 0920 07/04/16 1100  BP:  (!) 107/43  Pulse: 79 65  Resp:  20  Temp:      Last Pain:  Vitals:   07/04/16 0635  TempSrc: [name]                 [name]

## 2016-07-04 NOTE — Progress Notes (Signed)
  Echocardiogram Echocardiogram Transesophageal has been performed.  [name], [name] 07/04/2016, 11:27 AM

## 2016-07-04 NOTE — Anesthesia Procedure Notes (Signed)
Procedure Name: Intubation Date/Time: 07/04/2016 7:47 AM Performed by: Marena ChancyBECKNER, Aneliese Beaudry S Pre-anesthesia Checklist: Patient identified, Emergency Drugs available, Suction available and Patient being monitored Patient Re-evaluated:Patient Re-evaluated prior to inductionOxygen Delivery Method: Circle System Utilized Preoxygenation: Pre-oxygenation with 100% oxygen Intubation Type: IV induction Ventilation: Mask ventilation without difficulty Laryngoscope Size: Mac and 3 Grade View: Grade III Tube type: Oral Number of attempts: 1 Airway Equipment and Method: Stylet and Oral airway Placement Confirmation: ETT inserted through vocal cords under direct vision,  positive ETCO2 and breath sounds checked- equal and bilateral Tube secured with: Tape Dental Injury: Teeth and Oropharynx as per pre-operative assessment

## 2016-07-05 ENCOUNTER — Other Ambulatory Visit: Payer: Self-pay

## 2016-07-05 ENCOUNTER — Encounter (HOSPITAL_COMMUNITY): Payer: Self-pay | Admitting: Cardiovascular Disease

## 2016-07-05 ENCOUNTER — Inpatient Hospital Stay (HOSPITAL_COMMUNITY): Payer: Medicare Other

## 2016-07-05 DIAGNOSIS — Z952 Presence of prosthetic heart valve: Secondary | ICD-10-CM

## 2016-07-05 DIAGNOSIS — I350 Nonrheumatic aortic (valve) stenosis: Secondary | ICD-10-CM

## 2016-07-05 DIAGNOSIS — Z954 Presence of other heart-valve replacement: Secondary | ICD-10-CM

## 2016-07-05 LAB — BASIC METABOLIC PANEL
ANION GAP: 6 (ref 5–15)
BUN: 9 mg/dL (ref 6–20)
CALCIUM: 8.6 mg/dL — AB (ref 8.9–10.3)
CO2: 28 mmol/L (ref 22–32)
Chloride: 104 mmol/L (ref 101–111)
Creatinine, Ser: 0.61 mg/dL (ref 0.44–1.00)
Glucose, Bld: 131 mg/dL — ABNORMAL HIGH (ref 65–99)
POTASSIUM: 3.8 mmol/L (ref 3.5–5.1)
SODIUM: 138 mmol/L (ref 135–145)

## 2016-07-05 LAB — CBC
HCT: 33.9 % — ABNORMAL LOW (ref 36.0–46.0)
Hemoglobin: 10.6 g/dL — ABNORMAL LOW (ref 12.0–15.0)
MCH: 29.3 pg (ref 26.0–34.0)
MCHC: 31.3 g/dL (ref 30.0–36.0)
MCV: 93.6 fL (ref 78.0–100.0)
PLATELETS: 173 10*3/uL (ref 150–400)
RBC: 3.62 MIL/uL — AB (ref 3.87–5.11)
RDW: 14.8 % (ref 11.5–15.5)
WBC: 10.6 10*3/uL — AB (ref 4.0–10.5)

## 2016-07-05 LAB — PROTIME-INR
INR: 1.24
PROTHROMBIN TIME: 15.7 s — AB (ref 11.4–15.2)

## 2016-07-05 LAB — MAGNESIUM: MAGNESIUM: 1.9 mg/dL (ref 1.7–2.4)

## 2016-07-05 MED ORDER — METOPROLOL TARTRATE 100 MG PO TABS
100.0000 mg | ORAL_TABLET | Freq: Two times a day (BID) | ORAL | Status: DC
Start: 2016-07-05 — End: 2016-07-07
  Administered 2016-07-05 – 2016-07-07 (×5): 100 mg via ORAL
  Filled 2016-07-05: qty 2
  Filled 2016-07-05 (×4): qty 1

## 2016-07-05 MED ORDER — DILTIAZEM HCL ER COATED BEADS 240 MG PO CP24
240.0000 mg | ORAL_CAPSULE | Freq: Every day | ORAL | Status: DC
  Administered 2016-07-05 – 2016-07-06 (×2): 240 mg via ORAL
  Filled 2016-07-05 (×2): qty 1

## 2016-07-05 MED ORDER — FUROSEMIDE 80 MG PO TABS: 80.0000 mg | ORAL_TABLET | ORAL | Status: DC

## 2016-07-05 MED ORDER — FUROSEMIDE 40 MG PO TABS: 40.0000 mg | ORAL_TABLET | ORAL | Status: DC

## 2016-07-05 MED ORDER — SIMVASTATIN 10 MG PO TABS
10.0000 mg | ORAL_TABLET | Freq: Every day | ORAL | Status: DC
  Administered 2016-07-05 – 2016-07-07 (×3): 10 mg via ORAL
  Filled 2016-07-05 (×3): qty 1

## 2016-07-05 MED ORDER — WARFARIN - PHYSICIAN DOSING INPATIENT: Freq: Every day | Status: AC

## 2016-07-05 MED ORDER — WARFARIN - PHARMACIST DOSING INPATIENT
Freq: Every day | Status: DC
  Administered 2016-07-05 – 2016-07-06 (×2)

## 2016-07-05 MED ORDER — ASPIRIN EC 81 MG PO TBEC
81.0000 mg | DELAYED_RELEASE_TABLET | Freq: Every day | ORAL | Status: DC
  Administered 2016-07-05: 81 mg via ORAL
  Filled 2016-07-05 (×3): qty 1

## 2016-07-05 MED ORDER — WARFARIN SODIUM 5 MG PO TABS
5.0000 mg | ORAL_TABLET | Freq: Once | ORAL | Status: AC
  Administered 2016-07-05: 5 mg via ORAL
  Filled 2016-07-05: qty 1

## 2016-07-05 NOTE — Progress Notes (Signed)
[address]       [place] [address]             [phone]        CARDIOTHORACIC SURGERY PROGRESS NOTE   R1 Day Post-Op Procedure(s) (LRB): TRANSCATHETER AORTIC VALVE REPLACEMENT, TRANSFEMORAL (N/A) TRANSESOPHAGEAL ECHOCARDIOGRAM (TEE) (N/A)  Subjective: Looks good and feels well.  No pain.  No SOB  Objective: Vital signs: BP Readings from Last 1 Encounters:  07/05/16 (!) 165/72   Pulse Readings from Last 1 Encounters:  07/05/16 (!) 101   Resp Readings from Last 1 Encounters:  07/05/16 18   Temp Readings from Last 1 Encounters:  07/05/16 98.9 F (37.2 C) (Oral)    Hemodynamics:    Physical Exam:  Rhythm:   Afib  Breath sounds: clear  Heart sounds:  irregular  Incisions:  Both groins okay  Abdomen:  Soft, non-distended, non-tender  Extremities:  Warm, well-perfused    Intake/Output from previous day: 10/03 0701 - 10/04 0700 In: 2889.6 [P.O.:240; I.V.:2499.6; IV Piggyback:150] Out: 2185 [Urine:2175; Blood:10] Intake/Output this shift: No intake/output data recorded.  Lab Results:  CBC: Recent Labs  07/04/16 1045 07/04/16 1050 07/05/16 0345  WBC 8.0  --  10.6*  HGB 11.2* 11.2* 10.6*  HCT 34.8* 33.0* 33.9*  PLT 182  --  173    BMET:  Recent Labs  07/04/16 0935 07/04/16 1050 07/05/16 0345  NA 140 142 138  K 3.9 4.0 3.8  CL 101  --  104  CO2  --   --  28  GLUCOSE 192* 162* 131*  BUN 16  --  9  CREATININE 0.30*  --  0.61  CALCIUM  --   --  8.6*     PT/INR:   Recent Labs  07/04/16 1045  LABPROT 17.5*  INR 1.43    CBG (last 3)   Recent Labs  07/04/16 0626  GLUCAP 135*    ABG    Component Value Date/Time   PHART 7.370 07/04/2016 1127   PCO2ART 44.2 07/04/2016 1127   PO2ART 137.0 (H) 07/04/2016 1127   HCO3 26.1 07/04/2016 1127   TCO2 28 07/04/2016 1127   O2SAT 99.0 07/04/2016 1127    CXR: PORTABLE CHEST 1 VIEW  COMPARISON:  July 04, 2016  FINDINGS: The lungs are adequately inflated.  The interstitial markings are slightly more conspicuous today. There are coarse lung markings in the right infrahilar region which are stable. The heart is normal in size. The pulmonary vascularity is not clearly engorged. There is calcification in the wall of the aortic arch. The ICD is in stable position. The right internal jugular venous catheter tip projects over the junction of middle and distal thirds of the SVC. The prosthetic aortic valve cage is visible.  IMPRESSION: Slight increased prominence of the pulmonary interstitium since yesterday's study. No pleural effusion or pneumothorax.  Aortic atherosclerosis.   Electronically Signed   By: David  [place] M.D.   On: 07/05/2016 07:23   EKG: Afib w/out acute ischemic changes    Assessment/Plan: S/P Procedure(s) (LRB): TRANSCATHETER AORTIC VALVE REPLACEMENT, TRANSFEMORAL (N/A) TRANSESOPHAGEAL ECHOCARDIOGRAM (TEE) (N/A)  Doing well POD1 TAVR Chronic diastolic CHF Chronic persistent Afib Hypertension Type II diabetes mellitus, good glycemic control   Agree w/ plan outlined by Dr Excell Seltzer  Anticipate likely d/c home tomorrow  I spent in excess of 15 minutes during the conduct of this hospital encounter and >50% of this time involved direct face-to-face encounter with the patient for  counseling and/or coordination of their care.    Purcell Nails, MD 07/05/2016 8:30 AM

## 2016-07-05 NOTE — Progress Notes (Addendum)
    Subjective:  Feels well. No chest pain or shortness of breath.  Objective:  Vital Signs in the last 24 hours: Temp:  [95.2 F (35.1 C)-98.9 F (37.2 C)] 98.9 F (37.2 C) (10/04 0700) Pulse Rate:  [26-165] 104 (10/04 0700) Resp:  [9-20] 14 (10/04 0700) BP: (107-158)/(43-76) 148/58 (10/04 0700) SpO2:  [90 %-100 %] 91 % (10/04 0700) Arterial Line BP: (113-173)/(38-52) 157/50 (10/04 0700) Weight:  [154 lb 1.6 oz (69.9 kg)] 154 lb 1.6 oz (69.9 kg) (10/04 0500)  Intake/Output from previous day: 10/03 0701 - 10/04 0700 In: 2889.6 [P.O.:240; I.V.:2499.6; IV Piggyback:150] Out: 2185 [Urine:2175; Blood:10]  Physical Exam: Pt is alert and oriented, NAD HEENT: normal Neck: JVP - normal Lungs: CTA bilaterally CV: irregularly irregular with soft SEM at the RUSB Abd: soft, NT, Positive BS, no hepatomegaly Ext: no C/C/E, distal pulses intact and equal, bilateral groin sites clear Skin: warm/dry no rash   Lab Results:  Recent Labs  07/04/16 1045 07/04/16 1050 07/05/16 0345  WBC 8.0  --  10.6*  HGB 11.2* 11.2* 10.6*  PLT 182  --  173    Recent Labs  07/04/16 0935 07/04/16 1050 07/05/16 0345  NA 140 142 138  K 3.9 4.0 3.8  CL 101  --  104  CO2  --   --  28  GLUCOSE 192* 162* 131*  BUN 16  --  9  CREATININE 0.30*  --  0.61   No results for input(s): TROPONINI in the last 72 hours.  Invalid input(s): CK, MB  Tele: Atrial fibrillation HR 100-115 bpm  Assessment/Plan:  1. Severe symptomatic aortic stenosis POD #1 2. Chronic atrial fibrillation 3. HTN 4. Chronic diastolic heart failure, NYHA II 5. Tachy brady syndrome s/p PPM  Pt progressing well. Labs in range. Resume beta-blocker/Ca blocker at home doses. Discussed TAVI-AF trial of warfarin vs Edoxaban and she will consider this. Tx tele bed. Echo today. Anticipate DC tomorrow.  Reports hx of bleeding ulcer and unable to take ASA. Will give 81 mg EC ASA for at least a few days until anticoagulation started but  will not plan to DC her on this.  Tonny Bollmanooper, Cortney Mckinney, M.D. 07/05/2016, 7:56 AM

## 2016-07-05 NOTE — Progress Notes (Signed)
Pt complained of SOB. HR in 150s Afib RVR. Metoprolol 5mg  given. Medication effective. HR now in 105s-118s. Will continue to monitor pt closely. Breathing has improved and pt is resting comfortably.

## 2016-07-05 NOTE — Care Management Note (Signed)
Case Management Note  Patient Details  Name: [name] MRN: [id number] Date of Birth: [birth year]  Subjective/Objective:         S/p TVAR           Action/Plan:  PTA independent from home - alone.  Pt states she still cuts her grass and tends to her yard herself.  Pt states her niece will be with her one week post discharge.  CM will continue to monitor for discharge needs   Expected Discharge Date:  07/07/16               Expected Discharge Plan:  Home/Self Care  In-House Referral:     Discharge planning Services  CM Consult  Post Acute Care Choice:    Choice offered to:     DME Arranged:    DME Agency:     HH Arranged:    HH Agency:     Status of Service:  In process, will continue to follow  If discussed at Long Length of Stay Meetings, dates discussed:    Additional Comments:  Cherylann ParrClaxton, Destiny Trickey S, RN 07/05/2016, 11:54 AM

## 2016-07-05 NOTE — Discharge Instructions (Signed)
°Information on my medicine - Coumadin®   (Warfarin) ° °This medication education was reviewed with me or my healthcare representative as part of my discharge preparation.  The pharmacist that spoke with me during my hospital stay was:  Berthe Oley Rhea, RPH ° °Why was Coumadin prescribed for you? °Coumadin was prescribed for you because you have a blood clot or a medical condition that can cause an increased risk of forming blood clots. Blood clots can cause serious health problems by blocking the flow of blood to the heart, lung, or brain. Coumadin can prevent harmful blood clots from forming. °As a reminder your indication for Coumadin is:   Stroke Prevention Because Of Atrial Fibrillation ° °What test will check on my response to Coumadin? °While on Coumadin (warfarin) you will need to have an INR test regularly to ensure that your dose is keeping you in the desired range. The INR (international normalized ratio) number is calculated from the result of the laboratory test called prothrombin time (PT). ° °If an INR APPOINTMENT HAS NOT ALREADY BEEN MADE FOR YOU please schedule an appointment to have this lab work done by your health care provider within 7 days. °Your INR goal is usually a number between:  2 to 3 or your provider may give you a more narrow range like 2-2.5.  Ask your health care provider during an office visit what your goal INR is. ° °What  do you need to  know  About  COUMADIN? °Take Coumadin (warfarin) exactly as prescribed by your healthcare provider about the same time each day.  DO NOT stop taking without talking to the doctor who prescribed the medication.  Stopping without other blood clot prevention medication to take the place of Coumadin may increase your risk of developing a new clot or stroke.  Get refills before you run out. ° °What do you do if you miss a dose? °If you miss a dose, take it as soon as you remember on the same day then continue your regularly scheduled regimen the  next day.  Do not take two doses of Coumadin at the same time. ° °Important Safety Information °A possible side effect of Coumadin (Warfarin) is an increased risk of bleeding. You should call your healthcare provider right away if you experience any of the following: °  Bleeding from an injury or your nose that does not stop. °  Unusual colored urine (red or dark brown) or unusual colored stools (red or black). °  Unusual bruising for unknown reasons. °  A serious fall or if you hit your head (even if there is no bleeding). ° °Some foods or medicines interact with Coumadin® (warfarin) and might alter your response to warfarin. To help avoid this: °  Eat a balanced diet, maintaining a consistent amount of Vitamin K. °  Notify your provider about major diet changes you plan to make. °  Avoid alcohol or limit your intake to 1 drink for women and 2 drinks for men per day. °(1 drink is 5 oz. wine, 12 oz. beer, or 1.5 oz. liquor.) ° °Make sure that ANY health care provider who prescribes medication for you knows that you are taking Coumadin (warfarin).  Also make sure the healthcare provider who is monitoring your Coumadin knows when you have started a new medication including herbals and non-prescription products. ° °Coumadin® (Warfarin)  Major Drug Interactions  °Increased Warfarin Effect Decreased Warfarin Effect  °Alcohol (large quantities) °Antibiotics (esp. Septra/Bactrim, Flagyl, Cipro) °Amiodarone (Cordarone) °Aspirin (  ASA) °Cimetidine (Tagamet) °Megestrol (Megace) °NSAIDs (ibuprofen, naproxen, etc.) °Piroxicam (Feldene) °Propafenone (Rythmol SR) °Propranolol (Inderal) °Isoniazid (INH) °Posaconazole (Noxafil) Barbiturates (Phenobarbital) °Carbamazepine (Tegretol) °Chlordiazepoxide (Librium) °Cholestyramine (Questran) °Griseofulvin °Oral Contraceptives °Rifampin °Sucralfate (Carafate) °Vitamin K  ° °Coumadin® (Warfarin) Major Herbal Interactions  °Increased Warfarin Effect Decreased Warfarin Effect   °Garlic °Ginseng °Ginkgo biloba Coenzyme Q10 °Green tea °St. John’s wort   ° °Coumadin® (Warfarin) FOOD Interactions  °Eat a consistent number of servings per week of foods HIGH in Vitamin K °(1 serving = ½ cup)  °Collards (cooked, or boiled & drained) °Kale (cooked, or boiled & drained) °Mustard greens (cooked, or boiled & drained) °Parsley *serving size only = ¼ cup °Spinach (cooked, or boiled & drained) °Swiss chard (cooked, or boiled & drained) °Turnip greens (cooked, or boiled & drained)  °Eat a consistent number of servings per week of foods MEDIUM-HIGH in Vitamin K °(1 serving = 1 cup)  °Asparagus (cooked, or boiled & drained) °Broccoli (cooked, boiled & drained, or raw & chopped) °Brussel sprouts (cooked, or boiled & drained) *serving size only = ½ cup °Lettuce, raw (green leaf, endive, romaine) °Spinach, raw °Turnip greens, raw & chopped  ° °These websites have more information on Coumadin (warfarin):  www.coumadin.com; °www.ahrq.gov/consumer/coumadin.htm; ° ° ° °

## 2016-07-05 NOTE — Research (Signed)
[facility]visage Informed Consent   Subject Name: [name]  Subject met inclusion and exclusion criteria.  The informed consent form, study requirements and expectations were reviewed with the subject and questions and concerns were addressed prior to the signing of the consent form.  The subject verbalized understanding of the trail requirements.  The subject agreed to participate in the [facility]visage trial and signed the informed consent.  The informed consent was obtained prior to performance of any protocol-specific procedures for the subject.  A copy of the signed informed consent was given to the subject and a copy was placed in the subject's medical record.  [name] 07/05/2016,10:20

## 2016-07-06 ENCOUNTER — Telehealth: Payer: Self-pay | Admitting: Cardiovascular Disease

## 2016-07-06 ENCOUNTER — Inpatient Hospital Stay (HOSPITAL_COMMUNITY): Payer: Medicare Other

## 2016-07-06 DIAGNOSIS — I350 Nonrheumatic aortic (valve) stenosis: Secondary | ICD-10-CM

## 2016-07-06 DIAGNOSIS — I5033 Acute on chronic diastolic (congestive) heart failure: Secondary | ICD-10-CM

## 2016-07-06 LAB — PROTIME-INR
INR: 1.16
Prothrombin Time: 14.9 seconds (ref 11.4–15.2)

## 2016-07-06 LAB — ECHOCARDIOGRAM LIMITED
HEIGHTINCHES: 66 in
Weight: 2457.6 oz

## 2016-07-06 MED ORDER — DILTIAZEM HCL ER COATED BEADS 180 MG PO CP24
360.0000 mg | ORAL_CAPSULE | Freq: Every day | ORAL | Status: DC
  Administered 2016-07-07: 360 mg via ORAL
  Filled 2016-07-06: qty 2

## 2016-07-06 MED ORDER — FUROSEMIDE 80 MG PO TABS
80.0000 mg | ORAL_TABLET | ORAL | Status: DC
  Administered 2016-07-06: 80 mg via ORAL
  Filled 2016-07-06 (×2): qty 1

## 2016-07-06 MED ORDER — POTASSIUM CHLORIDE CRYS ER 20 MEQ PO TBCR
20.0000 meq | EXTENDED_RELEASE_TABLET | Freq: Two times a day (BID) | ORAL | Status: DC
  Administered 2016-07-06 – 2016-07-07 (×3): 20 meq via ORAL
  Filled 2016-07-06 (×3): qty 1

## 2016-07-06 MED ORDER — FUROSEMIDE 40 MG PO TABS
40.0000 mg | ORAL_TABLET | ORAL | Status: DC
  Administered 2016-07-07: 40 mg via ORAL
  Filled 2016-07-06: qty 1

## 2016-07-06 MED ORDER — FUROSEMIDE 10 MG/ML IJ SOLN
40.0000 mg | Freq: Once | INTRAMUSCULAR | Status: AC
Start: 2016-07-06 — End: 2016-07-06
  Administered 2016-07-06: 40 mg via INTRAVENOUS
  Filled 2016-07-06: qty 4

## 2016-07-06 MED ORDER — DILTIAZEM HCL 60 MG PO TABS
60.0000 mg | ORAL_TABLET | Freq: Once | ORAL | Status: AC
  Administered 2016-07-06: 60 mg via ORAL
  Filled 2016-07-06: qty 1

## 2016-07-06 MED ORDER — WARFARIN SODIUM 5 MG PO TABS
5.0000 mg | ORAL_TABLET | Freq: Every day | ORAL | Status: DC
  Administered 2016-07-06: 5 mg via ORAL
  Filled 2016-07-06: qty 2

## 2016-07-06 MED ORDER — FUROSEMIDE 10 MG/ML IJ SOLN
40.0000 mg | Freq: Once | INTRAMUSCULAR | Status: AC
  Administered 2016-07-06: 40 mg via INTRAVENOUS
  Filled 2016-07-06: qty 4

## 2016-07-06 NOTE — Progress Notes (Signed)
    Subjective:  Patient with shortness of breath and orthopnea last night. Chest x-ray was done and showed interstitial edema. She was given IV Lasix 1 with a good diuresis. She feels much better this morning. States breathing is back to normal.  Objective:  Vital Signs in the last 24 hours: Temp:  [98.1 F (36.7 C)-99.5 F (37.5 C)] 98.1 F (36.7 C) (10/05 0431) Pulse Rate:  [58-135] 96 (10/05 0652) Resp:  [16-22] 20 (10/04 2038) BP: (120-185)/(53-92) 139/57 (10/05 0652) SpO2:  [93 %-99 %] 94 % (10/05 0431) Arterial Line BP: (175)/(59) 175/59 (10/04 0800) Weight:  [145 lb (65.8 kg)-153 lb 9.6 oz (69.7 kg)] 153 lb 9.6 oz (69.7 kg) (10/05 0202)  Intake/Output from previous day: 10/04 0701 - 10/05 0700 In: 650 [P.O.:600; IV Piggyback:50] Out: 600 [Urine:600]  Physical Exam: Pt is alert and oriented, NAD HEENT: normal Neck: JVP - normal Lungs: CTA bilaterally CV: Irregularly irregular with grade 2/6 systolic ejection murmur at the left lower sternal border Abd: soft, NT, Positive BS, no hepatomegaly Ext: Trace pretibial edema bilaterally, distal pulses intact and equal Skin: warm/dry no rash   Lab Results:  Recent Labs  07/04/16 1045 07/04/16 1050 07/05/16 0345  WBC 8.0  --  10.6*  HGB 11.2* 11.2* 10.6*  PLT 182  --  173    Recent Labs  07/04/16 0935 07/04/16 1050 07/05/16 0345  NA 140 142 138  K 3.9 4.0 3.8  CL 101  --  104  CO2  --   --  28  GLUCOSE 192* 162* 131*  BUN 16  --  9  CREATININE 0.30*  --  0.61   No results for input(s): TROPONINI in the last 72 hours.  Invalid input(s): CK, MB  Cardiac Studies: CXR: FINDINGS: Cardiac pacemaker. Postoperative changes in the heart. Mild cardiac enlargement without vascular congestion. Emphysematous changes in the lungs. Scattered interstitial pattern to the lungs appears to be increasing since previous study which may indicate developing interstitial edema. No focal consolidation. No  pneumothorax. Calcification of the aorta. Degenerative changes in right shoulder  IMPRESSION: Cardiac enlargement with increasing interstitial pattern in the lungs since previous study suggesting developing interstitial edema  Tele: Atrial fibrillation heart rate ranged from 90-110 bpm  Assessment/Plan:  1. Severe symptomatic aortic stenosis POD #2 2. Chronic atrial fibrillation, now with RVR. Pt randomized to warfarin as part of [facility] TAVI-AF trial 3. HTN 4. Acute on Chronic diastolic heart failure - resting dyspnea last night, interstitial edema on CXR - responded well to IV Lasix 1 last night 5. Tachy brady syndrome s/p PPM  2-D echocardiogram pending today. Will give furosemide 80 mg orally this morning. She generally alternates furosemide 80 mg with furosemide 40 mg every other day. Continue diltiazem, digoxin, and metoprolol at her home doses. Ambulate with cardiac rehabilitation today. Pending her clinical progress, anticipate discharge home tomorrow.  Tonny Bollmanooper, Gean Laursen, M.D. 07/06/2016, 7:44 AM

## 2016-07-06 NOTE — Telephone Encounter (Signed)
Beth - the niece is requesting to speak to Dr Excell Seltzerooper about patient's condition.  She has questions r/t At Fib and the Lasix she received last night.  She didn't understand why she was given Lasix "the day of her surgery" and felt her understanding was she should receive it after her surgery.  Advised I will forward this information to Dr Excell Seltzerooper.  Also advised since he is rounding in the hospital and could have possible cases pending that she may not receive a call back immediately.  She voiced understanding.  DPR is on file to speak with her.

## 2016-07-06 NOTE — Progress Notes (Addendum)
      [address]       [place] [id number]             [phone]      2 Days Post-Op Procedure(s) (LRB): TRANSCATHETER AORTIC VALVE REPLACEMENT, TRANSFEMORAL (N/A) TRANSESOPHAGEAL ECHOCARDIOGRAM (TEE) (N/A) Subjective: Feel good this morning. Reports feeling much better now that she is on her home dose of diuretics  Objective: Vital signs in last 24 hours: Temp:  [98.1 F (36.7 C)-99.5 F (37.5 C)] 98.1 F (36.7 C) (10/05 0431) Pulse Rate:  [58-135] 96 (10/05 0652) Cardiac Rhythm: Atrial fibrillation;Bundle branch block (10/05 0730) Resp:  [16-22] 20 (10/04 2038) BP: (120-185)/(53-92) 139/57 (10/05 0652) SpO2:  [93 %-99 %] 94 % (10/05 0431) Weight:  [145 lb (65.8 kg)-153 lb 9.6 oz (69.7 kg)] 153 lb 9.6 oz (69.7 kg) (10/05 0202)      Intake/Output from previous day: 10/04 0701 - 10/05 0700 In: 650 [P.O.:600; IV Piggyback:50] Out: 600 [Urine:600] Intake/Output this shift: No intake/output data recorded.  General appearance: alert, cooperative and no distress Heart: irregularly irregular Lungs: clear to auscultation bilaterally Abdomen: soft, non-tender; bowel sounds normal; no masses,  no organomegaly Extremities: edema 1+ non-pitting pedal edema  Wound: groins soft  Lab Results:  Recent Labs  07/04/16 1045 07/04/16 1050 07/05/16 0345  WBC 8.0  --  10.6*  HGB 11.2* 11.2* 10.6*  HCT 34.8* 33.0* 33.9*  PLT 182  --  173   BMET:  Recent Labs  07/04/16 0935 07/04/16 1050 07/05/16 0345  NA 140 142 138  K 3.9 4.0 3.8  CL 101  --  104  CO2  --   --  28  GLUCOSE 192* 162* 131*  BUN 16  --  9  CREATININE 0.30*  --  0.61  CALCIUM  --   --  8.6*    PT/INR:  Recent Labs  07/06/16 0249  LABPROT 14.9  INR 1.16   ABG    Component Value Date/Time   PHART 7.370 07/04/2016 1127   HCO3 26.1 07/04/2016 1127   TCO2 28 07/04/2016 1127   O2SAT 99.0 07/04/2016 1127   CBG (last 3)   Recent Labs  07/04/16 0626  GLUCAP 135*     Assessment/Plan: S/P Procedure(s) (LRB): TRANSCATHETER AORTIC VALVE REPLACEMENT, TRANSFEMORAL (N/A) TRANSESOPHAGEAL ECHOCARDIOGRAM (TEE) (N/A)  1. Severe AS s/p TAVR 2. Chronic Atrial Fibrillation, rate 100s, Coumadin 5mg  daily. INR 1.16. Continue dilt, digoxin, and metoprolol 3. HTN-moderately controlled on current regimen 4. Acute on chronic diastolic heart failure-On lasix alternating doses 80mg  and 40mg .  5. Tachy/ Brady syndrome s/p PPM 6. Hyperlipidemia- tolerating statin therapy 7. DM type 2-good glycemic control on current regimen  Plan: Continue medical care. Progressing well s/p TAVR. Probably home tomorrow AM with daughter.     LOS: 2 days    Sharlene Doryessa N Conte 07/06/2016  PCXR today with improved edema Postop echo- no perivalvular leak, mean gradient 12 mm HG Agree with above assessment and plan P Donata ClayVan Trigt

## 2016-07-06 NOTE — Telephone Encounter (Signed)
Error     Call dropped

## 2016-07-06 NOTE — Progress Notes (Signed)
  Echocardiogram 2D Echocardiogram limited has been performed.  Nolon RodBrown, Tony 07/06/2016, 10:50 AM

## 2016-07-06 NOTE — Progress Notes (Signed)
CARDIAC REHAB PHASE I   PRE:  Rate/Rhythm: 118-120s   To 130-140 in bathroom  BP:  Supine:   Sitting: 157/97  Standing:    SaO2: 96%RA  MODE:  Ambulation: 440 ft   POST:  Rate/Rhythm: 132-155 afib  BP:  Supine:   Sitting: 152/82  Standing:    SaO2: 95-96%RA 1105-1138 Pt walked 440 ft with fairly steady gait. A little wobbly at times.  Held to arm just in case needed.. Denied palpitations but did c/o SOB. Watched heart rate during walk and would fluctuate from 130s to 150s. Had pt take standing rest break when 150s. To recliner after walk. Dr Excell Seltzerooper notified of heart rate when he came to see pt.  Luetta Nuttingharlene Rashida Ladouceur, RN BSN  07/06/2016 11:32 AM

## 2016-07-06 NOTE — Telephone Encounter (Signed)
New Message  Pts daughter voiced would like MD to call daughter about mothers situation in hospital.  Pts daughter voiced MD-Cooper was doing rounds today and spoke with her mother but she'd like to speak with him also. Please f/u

## 2016-07-07 ENCOUNTER — Telehealth: Payer: Self-pay | Admitting: Cardiology

## 2016-07-07 LAB — BASIC METABOLIC PANEL
ANION GAP: 9 (ref 5–15)
BUN: 9 mg/dL (ref 6–20)
CALCIUM: 9 mg/dL (ref 8.9–10.3)
CO2: 29 mmol/L (ref 22–32)
Chloride: 99 mmol/L — ABNORMAL LOW (ref 101–111)
Creatinine, Ser: 0.59 mg/dL (ref 0.44–1.00)
GFR calc Af Amer: >60 mL/min (ref >60)
GLUCOSE: 185 mg/dL — AB (ref 65–99)
Potassium: 3.7 mmol/L (ref 3.5–5.1)
SODIUM: 137 mmol/L (ref 135–145)

## 2016-07-07 LAB — PROTIME-INR
INR: 1.45
PROTHROMBIN TIME: 17.8 s — AB (ref 11.4–15.2)

## 2016-07-07 MED ORDER — PANTOPRAZOLE SODIUM 40 MG PO TBEC: 40.0000 mg | DELAYED_RELEASE_TABLET | Freq: Every day | ORAL | 12 refills | Status: DC

## 2016-07-07 MED ORDER — WARFARIN SODIUM 5 MG PO TABS: 5.0000 mg | ORAL_TABLET | Freq: Every day | ORAL | 3 refills | Status: DC

## 2016-07-07 MED ORDER — POTASSIUM CHLORIDE CRYS ER 20 MEQ PO TBCR: 20.0000 meq | EXTENDED_RELEASE_TABLET | Freq: Two times a day (BID) | ORAL | 12 refills | Status: AC

## 2016-07-07 MED ORDER — SIMVASTATIN 10 MG PO TABS: 10.0000 mg | ORAL_TABLET | Freq: Every day | ORAL | 12 refills | Status: AC

## 2016-07-07 MED ORDER — POTASSIUM CHLORIDE CRYS ER 20 MEQ PO TBCR
30.0000 meq | EXTENDED_RELEASE_TABLET | Freq: Once | ORAL | Status: AC
  Administered 2016-07-07: 30 meq via ORAL
  Filled 2016-07-07: qty 1

## 2016-07-07 MED ORDER — DILTIAZEM HCL ER COATED BEADS 360 MG PO CP24
360.0000 mg | ORAL_CAPSULE | Freq: Every day | ORAL | 12 refills | Status: DC
Start: 2016-07-08 — End: 2016-07-15

## 2016-07-07 NOTE — Progress Notes (Signed)
Order to discharge received.  Telemetry discontinued, CCMD notified.  Pt awaiting daughter for transfer to home.

## 2016-07-07 NOTE — Discharge Summary (Addendum)
Discharge Summary    Patient ID: [name],  MRN: [id number], DOB/AGE: [birth year] 82 y.o.  Admit date: 07/04/2016 Discharge date: 07/07/2016  Primary Care Provider: Lorenda Peck Primary Cardiologist: Dr. Jens Som  Discharge Diagnoses    Principal Problem:   S/P TAVR (transcatheter aortic valve replacement)  Secondary Diagnosis: Acute on chronic diastolic heart failure, Stage C Persistent atrial fibrillation  Allergies Allergies  Allergen Reactions  . Amiodarone Other (See Comments)    Stops her heart  . Aspirin Other (See Comments)    Gi bleeding  . Codeine Other (See Comments)    GI Lead  . Tramadol Nausea And Vomiting  . Lipitor [Atorvastatin Calcium] Other (See Comments)    Muscle Ache  . Pravachol Other (See Comments)    Muscle Ache    Diagnostic Studies/Procedures    Transthoracic Echocardiography 07/06/16- POD #1 TAVR Study Conclusions  - Left ventricle: Abnormal septal motion The cavity size was   normal. Wall thickness was normal. Systolic function was normal.   The estimated ejection fraction was in the range of 50% to 55%.   Doppler parameters are consistent with both elevated ventricular   end-diastolic filling pressure and elevated left atrial filling   pressure. - Aortic valve: Gradients increased since TEE introp when they were   mean 4 mmHg and peak 8 mmHg. Valve area (VTI): 1.2 cm^2. Valve   area (Vmax): 1.08 cm^2. Valve area (Vmean): 1.05 cm^2. - Mitral valve: Calcified annulus. There was mild regurgitation.   Valve area by continuity equation (using LVOT flow): 1.46 cm^2. - Left atrium: The atrium was severely dilated. - Atrial septum: No defect or patent foramen ovale was identified.   _____________   History of Present Illness   [name] is a 82 year old female with a past medical history of chronic persistent a fib (on coumadin), tachy-brady syndrome s/p PPM, DM, HTN, and HLD. Also had severe AS. She has been followed  for chronic atrial fibrillation for about 15 years and has had serial echos that have shown progressive aortic stenosis.  Her echo in 01/2016 showed severe AS with a peak gradient of 40 mm Hg and mild to moderate AI. The LVEF was mildly reduced at 45-50% with moderate LVH and diastolic dysfunction. She has remained very active but has developed exertional shortness of breath. She underwent cath as part of her workup for valve replacement and this showed no significant coronary disease. The mean transaortic valve gradient at cath was 35 mm Hg.  She is widowed and lives alone in [place]. She does not have any children but has a niece in [place] whom she is close to and will be coming to stay with her after surgery . She is active and mows her 9 acre property and does gardening.   Hospital Course   She presented to [facility] for TAVR on 07/04/16. She had some mild pulmonary edema post procedure which resolved with IV diuresis.   She has a history of Afib and developed some increased rates post op. Her diltiazem will be increased to 360mg  daily, also on metoprolol 100mg  BID. She has a history of bleeding ulcer and cannot take aspirin. She will go home on Coumadin and will have her INR checked on Monday.   She will alternate 40mg  po Lasix and 80mg  po Lasix daily.   Her post op Echo showed no perivalvular regurgitation. EF is 50-55%.   She was seen today by Dr. Excell Seltzer and deemed suitable  for discharge.   _____________  Discharge Vitals Blood pressure (!) 143/49, pulse 100, temperature 98.5 F (36.9 C), temperature source Oral, resp. rate 19, height 5\' 6"  (1.676 m), weight 147 lb 11.2 oz (67 kg), SpO2 96 %.  Filed Weights   07/05/16 1522 07/06/16 0202 07/07/16 0436  Weight: 145 lb (65.8 kg) 153 lb 9.6 oz (69.7 kg) 147 lb 11.2 oz (67 kg)   Physical Exam: Pt is alert and oriented, NAD HEENT: normal Neck: JVP - normal Lungs: CTA bilaterally CV: Irregularly irregular with grade 2/6 systolic  ejection murmur at the left lower sternal border Abd: soft, NT, Positive BS, no hepatomegaly Ext: Trace pretibial edema bilaterally, distal pulses intact and equal Skin: warm/dry no rash  Labs & Radiologic Studies     CBC  Recent Labs  07/05/16 0345  WBC 10.6*  HGB 10.6*  HCT 33.9*  MCV 93.6  PLT 173   Basic Metabolic Panel  Recent Labs  07/05/16 0345 07/07/16 0309  NA 138 137  K 3.8 3.7  CL 104 99*  CO2 28 29  GLUCOSE 131* 185*  BUN 9 9  CREATININE 0.61 0.59  CALCIUM 8.6* 9.0  MG 1.9  --     Dg Chest 2 View  Result Date: 06/29/2016 CLINICAL DATA:  Tachycardia - bradycardia syndrome with severe aortic stenosis and dyspnea. Preop for aortic valve surgery EXAM: CHEST  2 VIEW COMPARISON:  None. FINDINGS: The cardiac silhouette is slightly enlarged but stable in size and appearance. There is no pneumonic consolidation, effusion or pulmonary edema. Single lead pacing wire projects in the expected location the right ventricle with left-sided pacemaker apparatus as before. No acute fracture nor bone destruction. Stable small ossific density projects over the right shoulder suspicious for an intra-articular loose body. IMPRESSION: No active cardiopulmonary disease. Electronically Signed   By: Tollie Eth M.D.   On: 06/29/2016 14:56   Dg Chest 2 View  Result Date: 06/07/2016 CLINICAL DATA:  Preoperative examination prior to cardiac catheterization. Patient reports dyspnea with exertion. EXAM: CHEST  2 VIEW COMPARISON:  PA and lateral chest x-ray of January 03, 2014. FINDINGS: The lungs are well-expanded. There is no focal infiltrate. There is no pleural effusion. The heart is mildly enlarged. The pulmonary vascularity is not engorged. There is calcification in the wall of the aortic arch. The permanent pacemaker is in stable position. The bony thorax exhibits no acute abnormality. IMPRESSION: Mild stable cardiomegaly without pulmonary edema. No acute cardiopulmonary abnormality. Aortic  atherosclerosis. Electronically Signed   By: David  Swaziland M.D.   On: 06/07/2016 15:33   Ct Coronary Morph W/cta Cor W/score W/ca W/cm &/or Wo/cm  Addendum Date: 06/27/2016   ADDENDUM REPORT: 06/27/2016 07:57 ADDENDUM: Please see separate dictation for contemporaneously obtained CTA of the chest, abdomen and pelvis obtained 06/23/2016 for full description of important extracardiac findings. Electronically Signed   By: Trudie Reed M.D.   On: 06/27/2016 07:57   Result Date: 06/27/2016 MEDICATIONS: None EXAM: Cardiac TAVR CT TECHNIQUE: The patient was scanned on a Philips 256 scanner. A 120 kV retrospective scan was triggered in the descending thoracic aorta at 111 HU's. Gantry rotation speed was 270 msecs and collimation was .9 mm. No beta blockade or nitro were given. The 3D data set was reconstructed in 5% intervals of the R-R cycle. Systolic and diastolic phases were analyzed on a dedicated work station using MPR, MIP and VRT modes. The patient received 80 cc of contrast. Medications:  None FINDINGS: Aortic Valve: Calcified  trileaflet. Nodular calcification of annulus at base of left and non coronary cusps. Aorta: Mild calcification of the inferior surface of the root, and arch. Normal arch vessel take off Sinotubular Junction:  28 mm Ascending Thoracic Aorta:  32 mm Aortic Arch:  26 mm Descending Thoracic Aorta:  22 mm Sinus of Valsalva Measurements: Non-coronary:  31 mm Right -coronary:  32 mm Left -coronary:  32 mm Coronary Artery Height above Annulus: Left Main:  16 mm Right Coronary:  16.6 mm Virtual Basal Annulus Measurements: Maximum/Minimum Diameter:  20 mm x 26.4 mm Perimeter:  79 mm Area:  440 mm2 Coronary Arteries:  Sufficient height above annulus for deployment Optimum Fluoroscopic Angle for Delivery:  LAO 13 degrees IMPRESSION: 1) Calcified trileaflet aortic valve with an arnnular area of 440 mm2 suitable for a 26 mm Sapien 3 valve 2) Nodular calcification of annulus at base of non and left  cusps 3) Suitable coronary height for deployment 4) Optimum angiographic angle for deployment LAO 13 degrees 5) Severe biatrial enlargement with likely LAA thrombus and pacer system seen on RA/RV Charlton Haws Electronically Signed: By: Charlton Haws M.D. On: 06/23/2016 10:07   Dg Chest Port 1 View  Result Date: 07/06/2016 CLINICAL DATA:  Shortness of breath tonight EXAM: PORTABLE CHEST 1 VIEW COMPARISON:  07/05/2016 FINDINGS: Cardiac pacemaker. Postoperative changes in the heart. Mild cardiac enlargement without vascular congestion. Emphysematous changes in the lungs. Scattered interstitial pattern to the lungs appears to be increasing since previous study which may indicate developing interstitial edema. No focal consolidation. No pneumothorax. Calcification of the aorta. Degenerative changes in right shoulder IMPRESSION: Cardiac enlargement with increasing interstitial pattern in the lungs since previous study suggesting developing interstitial edema. Electronically Signed   By: Burman Nieves M.D.   On: 07/06/2016 03:05   Dg Chest Port 1 View  Result Date: 07/05/2016 CLINICAL DATA:  Status post aortic valve replacement. EXAM: PORTABLE CHEST 1 VIEW COMPARISON:  July 04, 2016 FINDINGS: The lungs are adequately inflated. The interstitial markings are slightly more conspicuous today. There are coarse lung markings in the right infrahilar region which are stable. The heart is normal in size. The pulmonary vascularity is not clearly engorged. There is calcification in the wall of the aortic arch. The ICD is in stable position. The right internal jugular venous catheter tip projects over the junction of middle and distal thirds of the SVC. The prosthetic aortic valve cage is visible. IMPRESSION: Slight increased prominence of the pulmonary interstitium since yesterday's study. No pleural effusion or pneumothorax. Aortic atherosclerosis. Electronically Signed   By: David  Swaziland M.D.   On: 07/05/2016 07:23    Dg Chest Port 1 View  Result Date: 07/04/2016 CLINICAL DATA:  Status post aortic valve replacement. EXAM: PORTABLE CHEST 1 VIEW COMPARISON:  Radiographs of June 29, 2016. FINDINGS: The heart size and mediastinal contours are within normal limits. Both lungs are clear. Atherosclerosis of thoracic aorta is noted. Left-sided pacemaker is unchanged in position. Right internal jugular catheter is noted with distal tip in expected position of cavoatrial junction. The visualized skeletal structures are unremarkable. IMPRESSION: Aortic atherosclerosis.  No acute cardiopulmonary abnormality seen. Electronically Signed   By: Lupita Raider, M.D.   On: 07/04/2016 11:41   Ct Angio Chest Aorta W/cm &/or Wo/cm  Result Date: 06/27/2016 CLINICAL DATA:  82 year old female with history of severe aortic stenosis. Preprocedural study prior to potential transcatheter aortic valve replacement (TAVR) procedure. EXAM: CT ANGIOGRAPHY CHEST, ABDOMEN AND PELVIS TECHNIQUE: Multidetector CT imaging  through the chest, abdomen and pelvis was performed using the standard protocol during bolus administration of intravenous contrast. Multiplanar reconstructed images and MIPs were obtained and reviewed to evaluate the vascular anatomy. CONTRAST:  70 mL of Isovue 370. COMPARISON:  No priors. FINDINGS: CTA CHEST FINDINGS Mediastinum/Lymph Nodes: Heart size is enlarged with left atrial dilatation. There is a filling defect in the tip of the left atrial appendage, concerning for left atrial appendage thrombus (image 72 of series 401). There is no significant pericardial fluid, thickening or pericardial calcification. Atherosclerosis of the thoracic aorta, without evidence of aneurysm or dissection. No definite coronary artery calcifications. Severe thickening calcifications of the aortic valve. Thickening calcification of the mitral-aortic intervalvular fibrosa, as well as the mitral valve and annulus. Left-sided pacemaker device in place  with lead tip terminating in the right ventricle along the mid interventricular septum. No pathologically enlarged mediastinal or hilar lymph nodes. Esophagus is unremarkable in appearance. No axillary lymphadenopathy. Lungs/Pleura: No suspicious appearing pulmonary nodules or masses. No acute consolidative airspace disease. No pleural effusions. Musculoskeletal/Soft Tissues: There are no aggressive appearing lytic or blastic lesions noted in the visualized portions of the skeleton. CTA ABDOMEN AND PELVIS FINDINGS Hepatobiliary: No suspicious cystic or solid hepatic lesions. No intra or extrahepatic biliary ductal dilatation. Gallbladder is normal in appearance. Pancreas: No pancreatic mass. No pancreatic ductal dilatation. No pancreatic or peripancreatic fluid or inflammatory changes. Spleen: Unremarkable. Adrenals/Urinary Tract: Sub cm low-attenuation lesion in the medial aspect of the upper pole the left kidney is too small to definitively characterize, but is statistically likely a tiny cyst. Right kidney and bilateral adrenal glands are normal in appearance. No hydroureteronephrosis. Urinary bladder is normal in appearance. Stomach/Bowel: The appearance of the stomach is normal. There is no pathologic dilatation of small bowel or colon. Numerous colonic diverticulae are noted, without surrounding inflammatory changes to suggest an acute diverticulitis at this time. The appendix is not confidently identified and may be surgically absent. Regardless, there are no inflammatory changes noted adjacent to the cecum to suggest the presence of an acute appendicitis at this time. Vascular/Lymphatic: Vascular findings and measurements pertinent to potential TAVR procedure, as detailed below. Celiac axis, superior mesenteric artery and inferior mesenteric artery are all widely patent without evidence of hemodynamically significant stenosis. Single renal arteries bilaterally are widely patent. No lymphadenopathy noted in  the abdomen or pelvis. Reproductive: Status post hysterectomy. Ovaries are not confidently identified may be surgically absent or atrophic. Other: No significant volume of ascites.  No pneumoperitoneum. Musculoskeletal: There are no aggressive appearing lytic or blastic lesions noted in the visualized portions of the skeleton. VASCULAR MEASUREMENTS PERTINENT TO TAVR: AORTA: Minimal Aortic Diameter -  12 x 14 mm Severity of Aortic Calcification -  moderate RIGHT PELVIS: Right Common Iliac Artery - Minimal Diameter - 6.4 x 10.3 mm Tortuosity - mild Calcification - moderate Right External Iliac Artery - Minimal Diameter - 6.7 x 6.1 mm Tortuosity - mild Calcification - minimal Right Common Femoral Artery - Minimal Diameter - 5.8 x 7.0 mm Tortuosity - mild Calcification - mild LEFT PELVIS: Left Common Iliac Artery - Minimal Diameter - 6.8 x 8.2 mm Tortuosity - moderate Calcification - moderate Left External Iliac Artery - Minimal Diameter - 6.7 x 6.1 mm Tortuosity - mild Calcification - none Left Common Femoral Artery - Minimal Diameter - 5.0 x 6.9 mm Tortuosity - mild Calcification - mild Review of the MIP images confirms the above findings. IMPRESSION: 1. Vascular findings and measurements pertinent to potential  TAVR procedure, as detailed above. This patient appears to have suitable pelvic arterial access bilaterally. 2. Severe thickening calcification of the aortic valve, compatible with the reported clinical history of severe aortic stenosis. There is also extensive calcification of the mitral-aortic intervalvular fibrosa, and thickening calcification of the mitral valve and annulus. 3. Cardiomegaly with left atrial dilatation, and small filling defect in the tip of the left atrial appendage concerning for left atrial appendage thrombus. 4. Colonic diverticulosis without evidence of acute diverticulitis at this time. 5. Additional incidental findings, as above. Electronically Signed   By: Trudie Reed M.D.   On:  06/27/2016 09:58   Ct Angio Abd/pel W/ And/or W/o  Result Date: 06/27/2016 CLINICAL DATA:  82 year old female with history of severe aortic stenosis. Preprocedural study prior to potential transcatheter aortic valve replacement (TAVR) procedure. EXAM: CT ANGIOGRAPHY CHEST, ABDOMEN AND PELVIS TECHNIQUE: Multidetector CT imaging through the chest, abdomen and pelvis was performed using the standard protocol during bolus administration of intravenous contrast. Multiplanar reconstructed images and MIPs were obtained and reviewed to evaluate the vascular anatomy. CONTRAST:  70 mL of Isovue 370. COMPARISON:  No priors. FINDINGS: CTA CHEST FINDINGS Mediastinum/Lymph Nodes: Heart size is enlarged with left atrial dilatation. There is a filling defect in the tip of the left atrial appendage, concerning for left atrial appendage thrombus (image 72 of series 401). There is no significant pericardial fluid, thickening or pericardial calcification. Atherosclerosis of the thoracic aorta, without evidence of aneurysm or dissection. No definite coronary artery calcifications. Severe thickening calcifications of the aortic valve. Thickening calcification of the mitral-aortic intervalvular fibrosa, as well as the mitral valve and annulus. Left-sided pacemaker device in place with lead tip terminating in the right ventricle along the mid interventricular septum. No pathologically enlarged mediastinal or hilar lymph nodes. Esophagus is unremarkable in appearance. No axillary lymphadenopathy. Lungs/Pleura: No suspicious appearing pulmonary nodules or masses. No acute consolidative airspace disease. No pleural effusions. Musculoskeletal/Soft Tissues: There are no aggressive appearing lytic or blastic lesions noted in the visualized portions of the skeleton. CTA ABDOMEN AND PELVIS FINDINGS Hepatobiliary: No suspicious cystic or solid hepatic lesions. No intra or extrahepatic biliary ductal dilatation. Gallbladder is normal in  appearance. Pancreas: No pancreatic mass. No pancreatic ductal dilatation. No pancreatic or peripancreatic fluid or inflammatory changes. Spleen: Unremarkable. Adrenals/Urinary Tract: Sub cm low-attenuation lesion in the medial aspect of the upper pole the left kidney is too small to definitively characterize, but is statistically likely a tiny cyst. Right kidney and bilateral adrenal glands are normal in appearance. No hydroureteronephrosis. Urinary bladder is normal in appearance. Stomach/Bowel: The appearance of the stomach is normal. There is no pathologic dilatation of small bowel or colon. Numerous colonic diverticulae are noted, without surrounding inflammatory changes to suggest an acute diverticulitis at this time. The appendix is not confidently identified and may be surgically absent. Regardless, there are no inflammatory changes noted adjacent to the cecum to suggest the presence of an acute appendicitis at this time. Vascular/Lymphatic: Vascular findings and measurements pertinent to potential TAVR procedure, as detailed below. Celiac axis, superior mesenteric artery and inferior mesenteric artery are all widely patent without evidence of hemodynamically significant stenosis. Single renal arteries bilaterally are widely patent. No lymphadenopathy noted in the abdomen or pelvis. Reproductive: Status post hysterectomy. Ovaries are not confidently identified may be surgically absent or atrophic. Other: No significant volume of ascites.  No pneumoperitoneum. Musculoskeletal: There are no aggressive appearing lytic or blastic lesions noted in the visualized portions of the skeleton.  VASCULAR MEASUREMENTS PERTINENT TO TAVR: AORTA: Minimal Aortic Diameter -  12 x 14 mm Severity of Aortic Calcification -  moderate RIGHT PELVIS: Right Common Iliac Artery - Minimal Diameter - 6.4 x 10.3 mm Tortuosity - mild Calcification - moderate Right External Iliac Artery - Minimal Diameter - 6.7 x 6.1 mm Tortuosity - mild  Calcification - minimal Right Common Femoral Artery - Minimal Diameter - 5.8 x 7.0 mm Tortuosity - mild Calcification - mild LEFT PELVIS: Left Common Iliac Artery - Minimal Diameter - 6.8 x 8.2 mm Tortuosity - moderate Calcification - moderate Left External Iliac Artery - Minimal Diameter - 6.7 x 6.1 mm Tortuosity - mild Calcification - none Left Common Femoral Artery - Minimal Diameter - 5.0 x 6.9 mm Tortuosity - mild Calcification - mild Review of the MIP images confirms the above findings. IMPRESSION: 1. Vascular findings and measurements pertinent to potential TAVR procedure, as detailed above. This patient appears to have suitable pelvic arterial access bilaterally. 2. Severe thickening calcification of the aortic valve, compatible with the reported clinical history of severe aortic stenosis. There is also extensive calcification of the mitral-aortic intervalvular fibrosa, and thickening calcification of the mitral valve and annulus. 3. Cardiomegaly with left atrial dilatation, and small filling defect in the tip of the left atrial appendage concerning for left atrial appendage thrombus. 4. Colonic diverticulosis without evidence of acute diverticulitis at this time. 5. Additional incidental findings, as above. Electronically Signed   By: Trudie Reed M.D.   On: 06/27/2016 09:58    Disposition   Pt is being discharged home today in good condition.  Follow-up Plans & Appointments    Follow-up Information    [facility] Follow up on 07/10/2016.   Specialty:  Cardiology Why:  for INR check  Contact information: [address] [place] [address] [phone]       Theodore Demark, PA-C Follow up on 07/17/2016.   Specialties:  Cardiology, Radiology Why:  at 9:00 am for hospital follow up. Contact information: [address] [place] [address] [phone]          Discharge Instructions    Diet - low sodium heart healthy     Complete by:  As directed    Discharge instructions    Complete by:  As directed    Please have your INR checked on Monday 10/9 at the [facility] office.   You have an appointment to see Dr. Ludwig Clarks PA on 07/17/16 at 9 am.   Increase activity slowly    Complete by:  As directed       Discharge Medications   Current Discharge Medication List    START taking these medications   Details  pantoprazole (PROTONIX) 40 MG tablet Take 1 tablet (40 mg total) by mouth daily. Qty: 30 tablet, Refills: 12    potassium chloride SA (K-DUR,KLOR-CON) 20 MEQ tablet Take 1 tablet (20 mEq total) by mouth 2 (two) times daily. Qty: 60 tablet, Refills: 12      CONTINUE these medications which have CHANGED   Details  diltiazem (CARDIZEM CD) 360 MG 24 hr capsule Take 1 capsule (360 mg total) by mouth daily. Qty: 30 capsule, Refills: 12    simvastatin (ZOCOR) 10 MG tablet Take 1 tablet (10 mg total) by mouth daily. Qty: 30 tablet, Refills: 12    warfarin (COUMADIN) 5 MG tablet Take 1 tablet (5 mg total) by mouth daily at 6 PM. Qty: 30 tablet, Refills: 3  CONTINUE these medications which have NOT CHANGED   Details  amoxicillin (AMOXIL) 500 MG capsule Take 500 mg by mouth daily as needed (for dental appointments).     calcium carbonate (OS-CAL) 600 MG TABS Take 1,200 mg by mouth 3 (three) times a week.     digoxin (LANOXIN) 0.25 MG tablet Take 1 tablet (0.25 mg total) by mouth daily. Qty: 120 tablet, Refills: 0    furosemide (LASIX) 40 MG tablet Take ONE tablet by mouth every other day alternating with TWO tablets every other day. Qty: 135 tablet, Refills: 3    gabapentin (NEURONTIN) 300 MG capsule Take 300-900 mg by mouth See admin instructions. Take 300 mg by mouth in the morning and take 900 mg by mouth in the evening.    levothyroxine (SYNTHROID, LEVOTHROID) 50 MCG tablet Take 50 mcg by mouth daily.      lisinopril (PRINIVIL,ZESTRIL) 2.5 MG tablet Take 2.5 mg by mouth daily.      metFORMIN (GLUCOPHAGE) 500 MG tablet Take 1,000 mg by mouth 2 (two) times daily.     metoprolol (LOPRESSOR) 100 MG tablet Take 1 tablet (100 mg total) by mouth 2 (two) times daily. Qty: 180 tablet, Refills: 0    Multiple Vitamins-Minerals (VISION-VITE PRESERVE PO) Take 1 capsule by mouth 2 (two) times daily.    psyllium (METAMUCIL) 58.6 % powder Take 1 packet by mouth daily.      STOP taking these medications     doxycycline (VIBRA-TABS) 100 MG tablet           Outstanding Labs/Studies   Duration of Discharge Encounter   Greater than 30 minutes including physician time.  Signed, Little Ishikawa NP 07/07/2016, 10:59 AM

## 2016-07-07 NOTE — Progress Notes (Signed)
      [address]       [place] [id number]             [phone]        3 Days Post-Op Procedure(s) (LRB): TRANSCATHETER AORTIC VALVE REPLACEMENT, TRANSFEMORAL (N/A) TRANSESOPHAGEAL ECHOCARDIOGRAM (TEE) (N/A)  Subjective: Patient states she is finally able to breathe.  Objective: Vital signs in last 24 hours: Temp:  [97.7 F (36.5 C)-98.5 F (36.9 C)] 98.5 F (36.9 C) (10/06 0436) Pulse Rate:  [72-123] 100 (10/06 0436) Cardiac Rhythm: Atrial fibrillation;Bundle branch block (10/06 0700) Resp:  [19] 19 (10/06 0436) BP: (112-157)/(49-97) 143/49 (10/06 0436) SpO2:  [96 %-100 %] 96 % (10/06 0436) Weight:  [147 lb 11.2 oz (67 kg)] 147 lb 11.2 oz (67 kg) (10/06 0436)  Current Weight  07/07/16 147 lb 11.2 oz (67 kg)   Intake/Output from previous day: 10/05 0701 - 10/06 0700 In: 372 [P.O.:372] Out: 1500 [Urine:1500]   Physical Exam:  Cardiovascular: IRRR IRRR, grade II/VI systolic murmur Pulmonary: Clear to auscultation bilaterally Abdomen: Soft, non tender, bowel sounds present. Wound: Right percutaneous groin wound dressing is clean and dry.    Lab Results: CBC: Recent Labs  07/04/16 1045 07/04/16 1050 07/05/16 0345  WBC 8.0  --  10.6*  HGB 11.2* 11.2* 10.6*  HCT 34.8* 33.0* 33.9*  PLT 182  --  173   BMET:  Recent Labs  07/05/16 0345 07/07/16 0309  NA 138 137  K 3.8 3.7  CL 104 99*  CO2 28 29  GLUCOSE 131* 185*  BUN 9 9  CREATININE 0.61 0.59  CALCIUM 8.6* 9.0    PT/INR:  Lab Results  Component Value Date   INR 1.45 07/07/2016   INR 1.16 07/06/2016   INR 1.24 07/05/2016   ABG:  INR: Will add last result for INR, ABG once components are confirmed Will add last 4 CBG results once components are confirmed  Assessment/Plan:  1. CV - Chronic a fib, tachy brady-previous  PPM. Participant of Envisage trial. On Lopressor 100 mg bid, Cardizem CD 360 mg daily, and Coumadin 5 mg daily. Echo yesterday showed LVEF 50-55%, gradients  increased since TEE intra op but no perivalvular regurgitation. INR 1.45. Per cardiology. 2.  Pulmonary - On room air. Encourage incentive spirometer.  3. Acute on chronic diastolic heart failure-On alternating Lasix 40 mg and 80 mg daily. 4. Supplement potassium 5. Management and disposition  per Dr. Cyndie Mullooper   ZIMMERMAN,DONIELLE MPA-C 07/07/2016,8:55 AM

## 2016-07-07 NOTE — Progress Notes (Signed)
CARDIAC REHAB PHASE I   PRE:  Rate/Rhythm: 65 afib POD    BP: sitting 126/51    SaO2: 99 RA  MODE:  Ambulation: 500 ft   POST:  Rate/Rhythm: 103 afib    BP: sitting 122/65     SaO2: 98 RA  Tolerated well. Slightly wobbly. No SOB or high HR. Encouraged cane use in open spaces at home. Gave her brochure for CRPII but she will probably not do. Gave walking gl and reminded of daily wts. [phone]   Janet MassonRandi Kristan Carrin Benitez CES, ACSM 07/07/2016 10:18 AM

## 2016-07-07 NOTE — Telephone Encounter (Signed)
TOC Phone Call . Appt is on 07/17/16 at 9:30am . W/ Rhonda Barrett . Thanks

## 2016-07-07 NOTE — Telephone Encounter (Signed)
Met with daughter at bedside yesterday. thx

## 2016-07-07 NOTE — Care Management Important Message (Signed)
Important Message  Patient Details  Name: [name] MRN: [id number] Date of Birth: [birth year]   Medicare Important Message Given:  Yes    Latanga Nedrow Abena 07/07/2016, 1:11 PM

## 2016-07-07 NOTE — Progress Notes (Signed)
    Subjective:  Feels much better. No CP or dyspnea.   Objective:  Vital Signs in the last 24 hours: Temp:  [97.7 F (36.5 C)-98.5 F (36.9 C)] 98.5 F (36.9 C) (10/06 0436) Pulse Rate:  [72-123] 100 (10/06 0436) Resp:  [19] 19 (10/06 0436) BP: (112-157)/(49-97) 143/49 (10/06 0436) SpO2:  [96 %-100 %] 96 % (10/06 0436) Weight:  [147 lb 11.2 oz (67 kg)] 147 lb 11.2 oz (67 kg) (10/06 0436)  Intake/Output from previous day: 10/05 0701 - 10/06 0700 In: 372 [P.O.:372] Out: 1500 [Urine:1500]  Physical Exam: Pt is alert and oriented, NAD HEENT: normal Neck: JVP - normal Lungs: CTA bilaterally CV: irregularly irregular with soft ejection murmur at the LSB Abd: soft, NT, Positive BS, no hepatomegaly Ext: no C/C/E, distal pulses intact and equal Skin: warm/dry no rash  Lab Results:  Recent Labs  07/04/16 1045 07/04/16 1050 07/05/16 0345  WBC 8.0  --  10.6*  HGB 11.2* 11.2* 10.6*  PLT 182  --  173    Recent Labs  07/05/16 0345 07/07/16 0309  NA 138 137  K 3.8 3.7  CL 104 99*  CO2 28 29  GLUCOSE 131* 185*  BUN 9 9  CREATININE 0.61 0.59   No results for input(s): TROPONINI in the last 72 hours.  Invalid input(s): CK, MB  Cardiac Studies: 2D Echo: Study Conclusions  - Left ventricle: Abnormal septal motion The cavity size was   normal. Wall thickness was normal. Systolic function was normal.   The estimated ejection fraction was in the range of 50% to 55%.   Doppler parameters are consistent with both elevated ventricular   end-diastolic filling pressure and elevated left atrial filling   pressure. - Aortic valve: Gradients increased since TEE introp when they were   mean 4 mmHg and peak 8 mmHg. Valve area (VTI): 1.2 cm^2. Valve   area (Vmax): 1.08 cm^2. Valve area (Vmean): 1.05 cm^2. - Mitral valve: Calcified annulus. There was mild regurgitation.   Valve area by continuity equation (using LVOT flow): 1.46 cm^2. - Left atrium: The atrium was severely  dilated. - Atrial septum: No defect or patent foramen ovale was identified.  Tele: Atrial fibrillation, heart rate better-controlled 80-100 bpm  Assessment/Plan:  1. Severe symptomatic aortic stenosis POD #3 2. Chronic atrial fibrillation. Pt randomized to warfarin as part of Envisage TAVI-AF trial. HR better controlled on combination of dig, metoprolol, and diltiazem (increased doses from home). 3. HTN - controlled 4. Acute on Chronic diastolic heart failure - improved after another dose of IV lasix yesterday. Resume home lasix regimen (80 mg alternating with 40 mg QOD). 5. Tachy brady syndrome s/p PPM  Post-op echo reviewed. Normal gradients and no PVL, LVEF in low-normal range. Home today on ASA 81 mg, warfarin, and home meds for atrial fibrillation. Cardizem CD increased to 360 mg daily. Home today. [facility] with Dr Jens Somrenshaw or his APP, then 30 day valve clinic appt with echo will be arranged.   Tonny Bollmanooper, Abbegail Matuska, M.D. 07/07/2016, 10:07 AM

## 2016-07-10 ENCOUNTER — Ambulatory Visit (INDEPENDENT_AMBULATORY_CARE_PROVIDER_SITE_OTHER): Payer: Medicare Other | Admitting: Pharmacist Clinician (PhC)/ Clinical Pharmacy Specialist

## 2016-07-10 DIAGNOSIS — Z5181 Encounter for therapeutic drug level monitoring: Secondary | ICD-10-CM | POA: Diagnosis not present

## 2016-07-10 LAB — POCT INR: INR: 2.9

## 2016-07-10 NOTE — Progress Notes (Signed)
29

## 2016-07-14 ENCOUNTER — Encounter (HOSPITAL_COMMUNITY): Payer: Self-pay

## 2016-07-14 ENCOUNTER — Emergency Department (HOSPITAL_COMMUNITY): Payer: Medicare Other

## 2016-07-14 ENCOUNTER — Observation Stay (HOSPITAL_COMMUNITY)
Admission: EM | Admit: 2016-07-14 | Discharge: 2016-07-15 | Disposition: A | Discharge status: Home / Self Care | Payer: Medicare Other | Attending: Cardiovascular Disease | Admitting: Cardiovascular Disease

## 2016-07-14 DIAGNOSIS — Z950 Presence of cardiac pacemaker: Secondary | ICD-10-CM | POA: Insufficient documentation

## 2016-07-14 DIAGNOSIS — Z79899 Other long term (current) drug therapy: Secondary | ICD-10-CM | POA: Insufficient documentation

## 2016-07-14 DIAGNOSIS — S0003XA Contusion of scalp, initial encounter: Secondary | ICD-10-CM | POA: Diagnosis not present

## 2016-07-14 DIAGNOSIS — W19XXXA Unspecified fall, initial encounter: Secondary | ICD-10-CM

## 2016-07-14 DIAGNOSIS — I482 Chronic atrial fibrillation, unspecified: Secondary | ICD-10-CM | POA: Diagnosis present

## 2016-07-14 DIAGNOSIS — I481 Persistent atrial fibrillation: Secondary | ICD-10-CM | POA: Diagnosis not present

## 2016-07-14 DIAGNOSIS — Z952 Presence of prosthetic heart valve: Secondary | ICD-10-CM | POA: Diagnosis not present

## 2016-07-14 DIAGNOSIS — E785 Hyperlipidemia, unspecified: Secondary | ICD-10-CM | POA: Diagnosis not present

## 2016-07-14 DIAGNOSIS — Z7901 Long term (current) use of anticoagulants: Secondary | ICD-10-CM | POA: Insufficient documentation

## 2016-07-14 DIAGNOSIS — W1812XA Fall from or off toilet with subsequent striking against object, initial encounter: Secondary | ICD-10-CM | POA: Insufficient documentation

## 2016-07-14 DIAGNOSIS — Y92511 Restaurant or cafe as the place of occurrence of the external cause: Secondary | ICD-10-CM | POA: Insufficient documentation

## 2016-07-14 DIAGNOSIS — I495 Sick sinus syndrome: Secondary | ICD-10-CM | POA: Diagnosis not present

## 2016-07-14 DIAGNOSIS — R55 Syncope and collapse: Principal | ICD-10-CM | POA: Insufficient documentation

## 2016-07-14 DIAGNOSIS — E7800 Pure hypercholesterolemia, unspecified: Secondary | ICD-10-CM | POA: Insufficient documentation

## 2016-07-14 DIAGNOSIS — E1142 Type 2 diabetes mellitus with diabetic polyneuropathy: Secondary | ICD-10-CM | POA: Diagnosis not present

## 2016-07-14 DIAGNOSIS — I5032 Chronic diastolic (congestive) heart failure: Secondary | ICD-10-CM | POA: Insufficient documentation

## 2016-07-14 DIAGNOSIS — Z7984 Long term (current) use of oral hypoglycemic drugs: Secondary | ICD-10-CM | POA: Insufficient documentation

## 2016-07-14 DIAGNOSIS — E039 Hypothyroidism, unspecified: Secondary | ICD-10-CM | POA: Diagnosis not present

## 2016-07-14 DIAGNOSIS — I110 Hypertensive heart disease with heart failure: Secondary | ICD-10-CM | POA: Diagnosis not present

## 2016-07-14 DIAGNOSIS — Z87891 Personal history of nicotine dependence: Secondary | ICD-10-CM | POA: Diagnosis not present

## 2016-07-14 LAB — BASIC METABOLIC PANEL
Anion gap: 13 (ref 5–15)
BUN: 13 mg/dL (ref 6–20)
CHLORIDE: 97 mmol/L — AB (ref 101–111)
CO2: 26 mmol/L (ref 22–32)
CREATININE: 0.76 mg/dL (ref 0.44–1.00)
Calcium: 9.3 mg/dL (ref 8.9–10.3)
GFR calc non Af Amer: >60 mL/min (ref >60)
Glucose, Bld: 129 mg/dL — ABNORMAL HIGH (ref 65–99)
POTASSIUM: 4.7 mmol/L (ref 3.5–5.1)
SODIUM: 136 mmol/L (ref 135–145)

## 2016-07-14 LAB — CBC WITH DIFFERENTIAL/PLATELET
BASOS ABS: 0 10*3/uL (ref 0.0–0.1)
BASOS PCT: 0 %
EOS ABS: 0.2 10*3/uL (ref 0.0–0.7)
Eosinophils Relative: 2 %
HCT: 37.9 % (ref 36.0–46.0)
HEMOGLOBIN: 12.5 g/dL (ref 12.0–15.0)
Lymphocytes Relative: 13 %
Lymphs Abs: 1.6 10*3/uL (ref 0.7–4.0)
MCH: 29.9 pg (ref 26.0–34.0)
MCHC: 33 g/dL (ref 30.0–36.0)
MCV: 90.7 fL (ref 78.0–100.0)
Monocytes Absolute: 0.9 10*3/uL (ref 0.1–1.0)
Monocytes Relative: 8 %
NEUTROS PCT: 77 %
Neutro Abs: 9.2 10*3/uL — ABNORMAL HIGH (ref 1.7–7.7)
Platelets: 284 10*3/uL (ref 150–400)
RBC: 4.18 MIL/uL (ref 3.87–5.11)
RDW: 14.4 % (ref 11.5–15.5)
WBC: 12 10*3/uL — AB (ref 4.0–10.5)

## 2016-07-14 LAB — I-STAT TROPONIN, ED: TROPONIN I, POC: 0.09 ng/mL — AB (ref 0.00–0.08)

## 2016-07-14 LAB — DIGOXIN LEVEL: Digoxin Level: 1.5 ng/mL (ref 0.8–2.0)

## 2016-07-14 LAB — PROTIME-INR
INR: 2.24
PROTHROMBIN TIME: 25.1 s — AB (ref 11.4–15.2)

## 2016-07-14 MED ORDER — GABAPENTIN 300 MG PO CAPS
900.0000 mg | ORAL_CAPSULE | Freq: Every day | ORAL | Status: DC
Start: 2016-07-14 — End: 2016-07-15
  Administered 2016-07-15: 900 mg via ORAL
  Filled 2016-07-14: qty 3

## 2016-07-14 MED ORDER — METOPROLOL TARTRATE 100 MG PO TABS
100.0000 mg | ORAL_TABLET | Freq: Two times a day (BID) | ORAL | Status: DC
  Administered 2016-07-15 (×2): 100 mg via ORAL
  Filled 2016-07-14 (×2): qty 1

## 2016-07-14 MED ORDER — CALCIUM CARBONATE 1250 (500 CA) MG PO TABS: 1250.0000 mg | ORAL_TABLET | ORAL | Status: DC

## 2016-07-14 MED ORDER — FUROSEMIDE 80 MG PO TABS: 80.0000 mg | ORAL_TABLET | ORAL | Status: DC

## 2016-07-14 MED ORDER — LISINOPRIL 2.5 MG PO TABS
2.5000 mg | ORAL_TABLET | Freq: Every day | ORAL | Status: DC
Start: 2016-07-15 — End: 2016-07-15
  Administered 2016-07-15: 2.5 mg via ORAL
  Filled 2016-07-14: qty 1

## 2016-07-14 MED ORDER — SIMVASTATIN 10 MG PO TABS
10.0000 mg | ORAL_TABLET | Freq: Every day | ORAL | Status: DC
Start: 2016-07-15 — End: 2016-07-15
  Administered 2016-07-15: 10 mg via ORAL
  Filled 2016-07-14: qty 1

## 2016-07-14 MED ORDER — ONDANSETRON HCL 4 MG/2ML IJ SOLN: 4.0000 mg | Freq: Four times a day (QID) | INTRAMUSCULAR | Status: DC | PRN

## 2016-07-14 MED ORDER — FUROSEMIDE 40 MG PO TABS
40.0000 mg | ORAL_TABLET | ORAL | Status: DC
  Administered 2016-07-15: 40 mg via ORAL
  Filled 2016-07-14: qty 1

## 2016-07-14 MED ORDER — GABAPENTIN 300 MG PO CAPS: 300.0000 mg | ORAL_CAPSULE | ORAL | Status: DC

## 2016-07-14 MED ORDER — ASPIRIN EC 81 MG PO TBEC
81.0000 mg | DELAYED_RELEASE_TABLET | Freq: Every day | ORAL | Status: DC
  Filled 2016-07-14: qty 1

## 2016-07-14 MED ORDER — FUROSEMIDE 40 MG PO TABS: 40.0000 mg | ORAL_TABLET | ORAL | Status: DC

## 2016-07-14 MED ORDER — DIGOXIN 125 MCG PO TABS
0.1250 mg | ORAL_TABLET | Freq: Every day | ORAL | Status: DC
  Administered 2016-07-15: 0.125 mg via ORAL
  Filled 2016-07-14: qty 1

## 2016-07-14 MED ORDER — ACETAMINOPHEN 325 MG PO TABS: 650.0000 mg | ORAL_TABLET | ORAL | Status: DC | PRN

## 2016-07-14 MED ORDER — LEVOTHYROXINE SODIUM 50 MCG PO TABS
50.0000 ug | ORAL_TABLET | Freq: Every day | ORAL | Status: DC
  Administered 2016-07-15: 50 ug via ORAL

## 2016-07-14 MED ORDER — DILTIAZEM HCL ER COATED BEADS 240 MG PO CP24
240.0000 mg | ORAL_CAPSULE | Freq: Every day | ORAL | Status: DC
  Administered 2016-07-15: 240 mg via ORAL
  Filled 2016-07-14: qty 1

## 2016-07-14 MED ORDER — METFORMIN HCL 500 MG PO TABS
1000.0000 mg | ORAL_TABLET | Freq: Two times a day (BID) | ORAL | Status: DC
  Administered 2016-07-15: 1000 mg via ORAL
  Filled 2016-07-14: qty 2

## 2016-07-14 MED ORDER — PANTOPRAZOLE SODIUM 40 MG PO TBEC
40.0000 mg | DELAYED_RELEASE_TABLET | Freq: Every day | ORAL | Status: DC
Start: 2016-07-15 — End: 2016-07-15
  Administered 2016-07-15: 40 mg via ORAL
  Filled 2016-07-14: qty 1

## 2016-07-14 MED ORDER — POTASSIUM CHLORIDE CRYS ER 20 MEQ PO TBCR
20.0000 meq | EXTENDED_RELEASE_TABLET | Freq: Two times a day (BID) | ORAL | Status: DC
  Administered 2016-07-15 (×2): 20 meq via ORAL
  Filled 2016-07-14 (×2): qty 1

## 2016-07-14 MED ORDER — GABAPENTIN 300 MG PO CAPS
300.0000 mg | ORAL_CAPSULE | Freq: Every day | ORAL | Status: DC
  Administered 2016-07-15: 300 mg via ORAL
  Filled 2016-07-14: qty 1

## 2016-07-14 MED ORDER — NITROGLYCERIN 0.4 MG SL SUBL: 0.4000 mg | SUBLINGUAL_TABLET | SUBLINGUAL | Status: DC | PRN

## 2016-07-14 NOTE — ED Notes (Signed)
Attempted to call report x 1  

## 2016-07-14 NOTE — ED Notes (Signed)
Patient transported to CT then to XR 

## 2016-07-14 NOTE — ED Provider Notes (Signed)
I saw and evaluated the patient, reviewed the resident's note and I agree with the findings and plan.   EKG Interpretation  Date/Time:  Friday July 14 2016 16:42:28 EDT Ventricular Rate:  89 PR Interval:    QRS Duration: 136 QT Interval:  380 QTC Calculation: 462 R Axis:   -72 Text Interpretation:  Atrial fibrillation Left axis deviation Left bundle branch block Abnormal ECG No significant change since last tracing History of chronic atrial fibrillation  Confirmed by Devera Englander MD, Annabelle HarmanANA ([id number]) on 07/14/2016 4:46:47 PM      I have independently reviewed the following tracings and/or images and used them in my medical decision making: CXR, CT head, CT cervical spine   82 year old female who presents after fall. History of atrial fibrillation on Coumadin and postop day 10 from TAVR procedure by Dr. Excell Seltzerooper for severe aortic stenosis. States she has been feeling great since her surgery in since discharge from the hospital on October 6. Within a [employer] restaurant today going to the restroom. States that she had a dizzy spell, feeling as if the room was spinning around her. States that she felt very unsteady getting off the toilet and fell hitting her head against the wall of the cubicle. She did not have loss of consciousness. She did not have any chest pain or shortness of breath. No lower extremity edema, focal numbness or weakness, confusion, speech or vision changes.  She is well-appearing in no acute distress. Grossly neuro intact. With normal vital signs. Hematoma over the forehead, but no other traumatic injuries noted. CT head and c-spine negative for injury. ? Vertigo vs near syncope causing symptoms. But given her recent TAVR procedure will discuss with cardiology. I spoke with Dr. Donnie Ahoilley from cardiology who evaluated patient in the ED. He will admit for observation overnight in to interrogate pacemaker, observe on telemetry for potential arrhythmogenic events.    Lavera Guiseana Duo Holley Wirt,  MD 07/14/16 [phone]

## 2016-07-14 NOTE — ED Provider Notes (Signed)
[facility]-EMERGENCY DEPT Provider Note   CSN: [id number] Arrival date & time: 07/14/16  1626     History   Chief Complaint Chief Complaint  Patient presents with  . Fall    HPI [name] is a 82 y.o. female.  The history is provided by the patient and a friend.  Fall  This is a new problem. The current episode started 1 to 2 hours ago. The problem has been resolved. Pertinent negatives include no chest pain, no abdominal pain and no shortness of breath. Nothing aggravates the symptoms. Nothing relieves the symptoms.  Loss of Consciousness   This is a new problem. The current episode started 1 to 2 hours ago. The problem occurs rarely. The problem has been resolved. She lost consciousness for a period of less than one minute (suspected). The problem is associated with normal activity (at [employer]). Pertinent negatives include abdominal pain, back pain, chest pain, confusion, diaphoresis, fever, nausea, palpitations, seizures, slurred speech, vomiting and weakness. Past medical history comments: TAVR.    Past Medical History:  Diagnosis Date  . Arthritis   . Chronic diastolic congestive heart failure (HCC)   . Diabetes mellitus   . Dysrhythmia   . History of peptic ulcer disease   . Hypercholesteremia   . Hyperlipidemia   . Hypertension   . Hypothyroidism   . Peripheral neuropathy (HCC)   . Permanent atrial fibrillation (HCC)   . Presence of permanent cardiac pacemaker   . 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 Severe aortic stenosis   . Shortness of breath dyspnea   . Tachycardia-bradycardia syndrome (HCC)    a. s/p SJM single chamber PPM implanted by Dr Reyes Ivan 2009  . Thrombocytopenia (HCC)   . Thyroid disease     Patient Active Problem List   Diagnosis Date Noted  . 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  Chronic diastolic congestive heart failure (HCC)   . Cerebrovascular disease 12/21/2015  . Pacemaker 12/21/2015  . Encounter for therapeutic drug monitoring 11/24/2013  . Osteoarthritis 11/24/2013  . Left carotid bruit 06/11/2013  . Essential hypertension 03/08/2011  . Type II or unspecified type diabetes mellitus without mention of complication, uncontrolled 03/08/2011  . Severe aortic stenosis 03/08/2011  . HYPERLIPIDEMIA 10/28/2010  . ATRIAL FIBRILLATION, CHRONIC 10/28/2010  . BRADYCARDIA-TACHYCARDIA SYNDROME 10/28/2010    Past Surgical History:  Procedure Laterality Date  . ABDOMINAL HYSTERECTOMY    . APPENDECTOMY    . BREAST BIOPSY  02/22/2007    left  . CARDIAC CATHETERIZATION N/A 06/14/2016   Procedure: Left Heart Cath and Coronary Angiography;  Surgeon: Tonny Bollman, MD;  Location: [facility] INVASIVE CV LAB;  Service: Cardiovascular;  Laterality: N/A;  . CARDIOVERSION  05/16/2005   Electrical cardioversion   . ESOPHAGOGASTRODUODENOSCOPY  03/12/2007    Esophagogastroduodenoscopy with control of bleeding  . PACEMAKER INSERTION  06/18/2008   SJM Zephyr XL SR implanted by Dr Reyes Ivan  . SHOULDER SURGERY  12/07/2004  . TEE WITHOUT CARDIOVERSION N/A 07/04/2016   Procedure: TRANSESOPHAGEAL ECHOCARDIOGRAM (TEE);  Surgeon: Tonny Bollman, MD;  Location: [facility];  Service: Open Heart Surgery;  Laterality: N/A;  . TRANSCATHETER AORTIC VALVE REPLACEMENT, TRANSFEMORAL N/A 07/04/2016   Procedure: TRANSCATHETER AORTIC VALVE REPLACEMENT, TRANSFEMORAL;  Surgeon: Tonny Bollman, MD;  Location: [facility] OR;  Service: Open Heart Surgery;  Laterality: N/A;    OB History    No data  available       Home Medications    Prior to Admission medications   Medication Sig Start Date End Date Taking? Authorizing Provider  amoxicillin (AMOXIL) 500 MG capsule Take 500 mg by mouth daily as needed (for dental appointments).  09/13/11   Historical Provider, MD  calcium carbonate (OS-CAL) 600 MG TABS Take 1,200 mg by  mouth 3 (three) times a week.     Historical Provider, MD  digoxin (LANOXIN) 0.25 MG tablet Take 1 tablet (0.25 mg total) by mouth daily. 03/03/16   Lewayne BuntingBrian S Crenshaw, MD  diltiazem (CARDIZEM CD) 360 MG 24 hr capsule Take 1 capsule (360 mg total) by mouth daily. 07/08/16   Little IshikawaErin E Smith, NP  furosemide (LASIX) 40 MG tablet Take ONE tablet by mouth every other day alternating with TWO tablets every other day. Patient taking differently: Take 40-80 mg by mouth See admin instructions. Takes 40 mg by mouth every other day alternating with 80 mg the other days. 01/03/16   Lewayne BuntingBrian S Crenshaw, MD  gabapentin (NEURONTIN) 300 MG capsule Take 300-900 mg by mouth See admin instructions. Take 300 mg by mouth in the morning and take 900 mg by mouth in the evening. 01/23/11   Historical Provider, MD  levothyroxine (SYNTHROID, LEVOTHROID) 50 MCG tablet Take 50 mcg by mouth daily.      Historical Provider, MD  lisinopril (PRINIVIL,ZESTRIL) 2.5 MG tablet Take 2.5 mg by mouth daily. 08/14/12   Historical Provider, MD  metFORMIN (GLUCOPHAGE) 500 MG tablet Take 1,000 mg by mouth 2 (two) times daily.  11/25/14   Historical Provider, MD  metoprolol (LOPRESSOR) 100 MG tablet Take 1 tablet (100 mg total) by mouth 2 (two) times daily. 05/09/16   Lewayne BuntingBrian S Crenshaw, MD  Multiple Vitamins-Minerals (VISION-VITE PRESERVE PO) Take 1 capsule by mouth 2 (two) times daily.    Historical Provider, MD  pantoprazole (PROTONIX) 40 MG tablet Take 1 tablet (40 mg total) by mouth daily. 07/08/16   Little IshikawaErin E Smith, NP  potassium chloride SA (K-DUR,KLOR-CON) 20 MEQ tablet Take 1 tablet (20 mEq total) by mouth 2 (two) times daily. 07/07/16   Little IshikawaErin E Smith, NP  psyllium (METAMUCIL) 58.6 % powder Take 1 packet by mouth daily.    Historical Provider, MD  simvastatin (ZOCOR) 10 MG tablet Take 1 tablet (10 mg total) by mouth daily. 07/08/16   Little IshikawaErin E Smith, NP  warfarin (COUMADIN) 5 MG tablet Take 1 tablet (5 mg total) by mouth daily at 6 PM. 07/07/16   Little IshikawaErin E Smith, NP      Family History Family History  Problem Relation Age of Onset  . Heart disease Father   . Emphysema Father   . Heart failure Father   . Coronary artery disease Father   . Stroke Sister     cerebral hemorrhage  . Breast cancer Sister   . Heart disease Sister   . Heart failure Sister     Social History Social History  Substance Use Topics  . Smoking status: Former Smoker    Packs/day: 1.00    Years: 10.00    Types: Cigarettes    Quit date: 03/06/1961  . Smokeless tobacco: Never Used  . Alcohol use No     Allergies   Amiodarone; Aspirin; Codeine; Tramadol; Lipitor [atorvastatin calcium]; and Pravachol   Review of Systems Review of Systems  Constitutional: Negative for chills, diaphoresis and fever.  HENT: Negative for ear pain and sore throat.   Eyes: Negative for pain and visual  disturbance.  Respiratory: Negative for cough and shortness of breath.   Cardiovascular: Positive for syncope. Negative for chest pain and palpitations.  Gastrointestinal: Negative for abdominal pain, nausea and vomiting.  Genitourinary: Negative for dysuria and hematuria.  Musculoskeletal: Negative for arthralgias and back pain.  Skin: Negative for color change and rash.  Neurological: Positive for syncope. Negative for seizures and weakness.  Psychiatric/Behavioral: Negative for confusion.  All other systems reviewed and are negative.    Physical Exam Updated Vital Signs BP 150/69 (BP Location: Left Arm)   Pulse 88   Temp 97.8 F (36.6 C) (Oral)   Resp 18   SpO2 100%   Physical Exam  Constitutional: She is oriented to person, place, and time. She appears well-developed and well-nourished. No distress.  HENT:  Head: Normocephalic.  Hematoma to right forehead  Eyes: Conjunctivae and EOM are normal. Pupils are equal, round, and reactive to light.  Neck: Normal range of motion. Neck supple.  Cardiovascular: Normal rate, regular rhythm and intact distal pulses.   Pulmonary/Chest:  Effort normal and breath sounds normal. No respiratory distress.  Abdominal: Soft. There is no tenderness.  Musculoskeletal: She exhibits no edema.  Neurological: She is alert and oriented to person, place, and time.  Skin: Skin is warm and dry.  Psychiatric: She has a normal mood and affect.  Nursing note and vitals reviewed.    ED Treatments / Results  Labs (all labs ordered are listed, but only abnormal results are displayed) Labs Reviewed - No data to display  EKG  EKG Interpretation  Date/Time:  Friday July 14 2016 16:42:28 EDT Ventricular Rate:  89 PR Interval:    QRS Duration: 136 QT Interval:  380 QTC Calculation: 462 R Axis:   -72 Text Interpretation:  Atrial fibrillation Left axis deviation Left bundle branch block Abnormal ECG No significant change since last tracing History of chronic atrial fibrillation  Confirmed by LIU MD, DANA [id number] on 07/14/2016 4:46:47 PM       Radiology Dg Chest 2 View  Result Date: 07/14/2016 CLINICAL DATA:  Dizziness.  Fall. EXAM: CHEST  2 VIEW COMPARISON:  July 06, 2016 FINDINGS: Cardiomegaly. No pneumothorax. Single lead pacemaker. No other interval changes or acute abnormalities. No overt edema. IMPRESSION: No active cardiopulmonary disease. Electronically Signed   By: Gerome Sam III M.D   On: 07/14/2016 18:19   Ct Head Wo Contrast  Result Date: 07/14/2016 CLINICAL DATA:  Pain after fall. EXAM: CT HEAD WITHOUT CONTRAST CT CERVICAL SPINE WITHOUT CONTRAST TECHNIQUE: Multidetector CT imaging of the head and cervical spine was performed following the standard protocol without intravenous contrast. Multiplanar CT image reconstructions of the cervical spine were also generated. COMPARISON:  None. FINDINGS: CT HEAD FINDINGS Brain: No subdural, epidural, or subarachnoid hemorrhage. Ventricles and sulci are normal for age. Cerebellum, brainstem, and basal cisterns are normal. Scattered mild white matter changes. No evidence of acute  cortical ischemia or infarct. No mass, mass effect, or midline shift. Vascular: Calcified atherosclerosis in the intracranial carotid arteries. Skull: Normal. Negative for fracture or focal lesion. Sinuses/Orbits: No acute finding. Other: There is a hematoma over the right scalp. Extracranial soft tissues otherwise normal. CT CERVICAL SPINE FINDINGS Alignment: Normal. Skull base and vertebrae: No acute fracture. No primary bone lesion or focal pathologic process. Soft tissues and spinal canal: No prevertebral fluid or swelling. No visible canal hematoma. Disc levels:  Multilevel degenerative changes. Upper chest: Negative. Other: No other abnormalities. IMPRESSION: 1. No acute intracranial abnormality. 2. No fracture  or traumatic malalignment in the cervical spine. Electronically Signed   By: Gerome Sam III M.D   On: 07/14/2016 18:27   Ct Cervical Spine Wo Contrast  Result Date: 07/14/2016 CLINICAL DATA:  Pain after fall. EXAM: CT HEAD WITHOUT CONTRAST CT CERVICAL SPINE WITHOUT CONTRAST TECHNIQUE: Multidetector CT imaging of the head and cervical spine was performed following the standard protocol without intravenous contrast. Multiplanar CT image reconstructions of the cervical spine were also generated. COMPARISON:  None. FINDINGS: CT HEAD FINDINGS Brain: No subdural, epidural, or subarachnoid hemorrhage. Ventricles and sulci are normal for age. Cerebellum, brainstem, and basal cisterns are normal. Scattered mild white matter changes. No evidence of acute cortical ischemia or infarct. No mass, mass effect, or midline shift. Vascular: Calcified atherosclerosis in the intracranial carotid arteries. Skull: Normal. Negative for fracture or focal lesion. Sinuses/Orbits: No acute finding. Other: There is a hematoma over the right scalp. Extracranial soft tissues otherwise normal. CT CERVICAL SPINE FINDINGS Alignment: Normal. Skull base and vertebrae: No acute fracture. No primary bone lesion or focal  pathologic process. Soft tissues and spinal canal: No prevertebral fluid or swelling. No visible canal hematoma. Disc levels:  Multilevel degenerative changes. Upper chest: Negative. Other: No other abnormalities. IMPRESSION: 1. No acute intracranial abnormality. 2. No fracture or traumatic malalignment in the cervical spine. Electronically Signed   By: Gerome Sam III M.D   On: 07/14/2016 18:27    Procedures Procedures (including critical care time)  Medications Ordered in ED Medications - No data to display   Initial Impression / Assessment and Plan / ED Course  I have reviewed the triage vital signs and the nursing notes.  Pertinent labs & imaging results that were available during my care of the patient were reviewed by me and considered in my medical decision making (see chart for details).  Clinical Course   [name] is an 82 year old female with PMH significant for recent TAVR who presents for syncope and fall.  She struck her head and has a hematoma on physical exam.  CT head demonstrates no acute intracranial processes.  EKG show a-fib with no significant changes from prior.  Labs ordered, including CBC, BMP, coagulation studies, troponin, and digoxin level.  Results significant for positive troponin, leukocytosis.  CXR obtained, personally reviewed be me, demonstrates no acute cardiac or pulmonary processes.  Cardiology was consulted and will admit for further syncope workup and positive troponin.  Final Clinical Impressions(s) / ED Diagnoses   Final diagnoses:  Fall    New Prescriptions New Prescriptions   No medications on file     Garey Ham, MD 07/15/16 1433    Lavera Guise, MD 07/16/16 [phone]

## 2016-07-14 NOTE — H&P (Addendum)
CARDIOLOGY CONSULT NOTE   Patient ID: [name] MRN: [id number] DOB/AGE: 02/05/1927 82 y.o.  Admit date: 07/14/2016  Primary Physician   ROBERTS, [name], MD Primary Cardiologist   Dr Jens Som Reason for Consultation   Elevated troponin Requesting MD: Dr Verdie Mosher   [name] is a 82 y.o. year old female with a history of chronic persistent a fib (on coumadin), tachy-brady syndrome s/p SJC PPM, DM, HTN, and HLD. Also had severe AS.  D/c 10/06 after TAVR was performed on 10/03. She did well after discharge. She has been walking as directed. She has been feeling well. No DOE, weight has been stable. No changes in her meds. She was on a higher dose of Cardizem at discharge, other meds the same. She has not had palpitations.   Today, she felt dizzy at home when she was up and moving around. She did not fall and was not in danger of losing consciousness.  She went to [employer] with friends she had been doing so this week, had been sitting there for a couple of hours had got up and moved around and changed chairs.. She had a couple of sodas and a cup of coffee. She needed to use the restroom, but when she got there, she was extremely dizzy. She tried to sit on the toilet, but fell. She was more alert then but very weak. She was able to get to the rail, and pull herself up. The dizziness had resolved. It did not come back..  She describes the room as spinning and she was severely dizzy.  But was able to walk back out and motion to her friends.  She was transported here where she has had a CT scan that did not show any acute abnormality.  EKG showed atrial fibrillation.  She currently feels fine except has a large hematoma of her right forehead.  She did not have any chest pain or shortness of breath.  She has actually felt quite well since the TAVR.  Past Medical History:  Diagnosis Date  . Arthritis   . Chronic diastolic congestive heart failure (HCC)   . Diabetes mellitus     . Dysrhythmia   . History of peptic ulcer disease   . Hypercholesteremia   . Hyperlipidemia   . Hypertension   . Hypothyroidism   . Peripheral neuropathy (HCC)   . Permanent atrial fibrillation (HCC)   . Presence of permanent cardiac pacemaker   . 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 Severe aortic stenosis   . Shortness of breath dyspnea   . Tachycardia-bradycardia syndrome (HCC)    a. s/p SJM single chamber PPM implanted by Dr Reyes Ivan 2009  . Thrombocytopenia (HCC)   . Thyroid disease     Past Surgical History:  Procedure Laterality Date  . ABDOMINAL HYSTERECTOMY    . APPENDECTOMY    . BREAST BIOPSY  02/22/2007    left  . CARDIAC CATHETERIZATION N/A 06/14/2016   Procedure: Left Heart Cath and Coronary Angiography;  Surgeon: Tonny Bollman, MD;  Location: [facility] INVASIVE CV LAB;  Service: Cardiovascular;  Laterality: N/A;  . CARDIOVERSION  05/16/2005   Electrical cardioversion   . ESOPHAGOGASTRODUODENOSCOPY  03/12/2007    Esophagogastroduodenoscopy with control of bleeding  . PACEMAKER INSERTION  06/18/2008   SJM Zephyr XL SR implanted by Dr Reyes Ivan  . SHOULDER SURGERY  12/07/2004  .  TEE WITHOUT CARDIOVERSION N/A 07/04/2016   Procedure: TRANSESOPHAGEAL ECHOCARDIOGRAM (TEE);  Surgeon: Tonny Bollman, MD;  Location: [facility] OR;  Service: Open Heart Surgery;  Laterality: N/A;  . TRANSCATHETER AORTIC VALVE REPLACEMENT, TRANSFEMORAL N/A 07/04/2016   Procedure: TRANSCATHETER AORTIC VALVE REPLACEMENT, TRANSFEMORAL;  Surgeon: Tonny Bollman, MD;  Location: [facility] OR;  Service: Open Heart Surgery;  Laterality: N/A;   Allergies  Allergen Reactions  . Amiodarone Other (See Comments)    Stops her heart  . Aspirin Other (See Comments)    Gi bleeding  . Codeine Other (See Comments)    GI Lead  . Tramadol Nausea And Vomiting  . Lipitor [Atorvastatin Calcium] Other (See  Comments)    Muscle Ache  . Pravachol Other (See Comments)    Muscle Ache   I have reviewed the patient's current medications Medication Sig  amoxicillin (AMOXIL) 500 MG capsule Take 500 mg by mouth daily as needed (for dental appointments).   calcium carbonate (OS-CAL) 600 MG TABS Take 1,200 mg by mouth 3 (three) times a week.   digoxin (LANOXIN) 0.25 MG tablet Take 1 tablet (0.25 mg total) by mouth daily.  diltiazem (CARDIZEM CD) 360 MG 24 hr capsule Take 1 capsule (360 mg total) by mouth daily.  furosemide (LASIX) 40 MG tablet Take ONE tablet by mouth every other day alternating with TWO tablets every other day. Patient taking differently: Take 40-80 mg by mouth See admin instructions. Takes 40 mg by mouth every other day alternating with 80 mg the other days.  gabapentin (NEURONTIN) 300 MG capsule Take 300-900 mg by mouth See admin instructions. Take 300 mg by mouth in the morning and take 900 mg by mouth in the evening.  levothyroxine (SYNTHROID, LEVOTHROID) 50 MCG tablet Take 50 mcg by mouth daily.    lisinopril (PRINIVIL,ZESTRIL) 2.5 MG tablet Take 2.5 mg by mouth daily.  metFORMIN (GLUCOPHAGE) 500 MG tablet Take 1,000 mg by mouth 2 (two) times daily.   metoprolol (LOPRESSOR) 100 MG tablet Take 1 tablet (100 mg total) by mouth 2 (two) times daily.  Multiple Vitamins-Minerals (VISION-VITE PRESERVE PO) Take 1 capsule by mouth 2 (two) times daily.  pantoprazole (PROTONIX) 40 MG tablet Take 1 tablet (40 mg total) by mouth daily.  potassium chloride SA (K-DUR,KLOR-CON) 20 MEQ tablet Take 1 tablet (20 mEq total) by mouth 2 (two) times daily.  psyllium (METAMUCIL) 58.6 % powder Take 1 packet by mouth daily.  simvastatin (ZOCOR) 10 MG tablet Take 1 tablet (10 mg total) by mouth daily.  warfarin (COUMADIN) 5 MG tablet Take 1 tablet (5 mg total) by mouth daily at 6 PM.    Social History   Occupational History  . Retired    Social History Main Topics  . Smoking status: Former Smoker     Packs/day: 1.00    Years: 10.00    Types: Cigarettes    Quit date: 03/06/1961  . Smokeless tobacco: Never Used  . Alcohol use No  . Drug use: No  . Sexual activity: Not on file    Social History Narrative   Lives alone near [place], [place].    Family Status  Relation Status  . Mother Deceased at age 60   died from childbirth  . Father Deceased at age 67   CHF  . Sister Deceased   child birth  . Sister Deceased   breast cancer and CHF  . Sister Deceased   cerebral hemorrhage due to aneurysm  . Sister   . Sister   . Sister   .  Sister    Family History  Problem Relation Age of Onset  . Heart disease Father   . Emphysema Father   . Heart failure Father   . Coronary artery disease Father   . Stroke Sister     cerebral hemorrhage  . Breast cancer Sister   . Heart disease Sister   . Heart failure Sister      ROS:  She has a peripheral neuropathy with some numbness of her lower extremities.  No GI or GU complaints.  She has had some mild edema involving her left foot.  Other than as noted above the remainder of the review of systems is unremarkable.  Physical Exam: Blood pressure 149/69, pulse 68, temperature 97.8 F (36.6 C), temperature source Oral, resp. rate 17, SpO2 100 %.  General: Well developed, well nourished, female in no acute distress Head: Eyes PERRLA, No xanthomas.   Normocephalic and atraumatic, oropharynx without edema or exudate. Dentition: fair, large hematoma noted on the right for head. Lungs: clear bilaterally Heart: Heart irregular rate and rhythm with S1, S2, 1/6 murmur. pulses are 2+ all 4 extrem.   Neck: No carotid bruits. No lymphadenopathy.  JVD not elevated Abdomen: Bowel sounds present, abdomen soft and non-tender without masses or hernias noted. Msk:  No spine or cva tenderness. No weakness, no joint deformities or effusions. Extremities: No clubbing or cyanosis.  Changes of chronic venous insufficiency are noted left leg greater than right.   Trace edema present in the left leg. Neuro: Alert and oriented X 3. No focal deficits noted. Psych:  Good affect, responds appropriately Skin: No rashes or lesions noted.  Labs:   Lab Results  Component Value Date   WBC 12.0 (H) 07/14/2016   HGB 12.5 07/14/2016   HCT 37.9 07/14/2016   MCV 90.7 07/14/2016   PLT 284 07/14/2016    Recent Labs  07/14/16 1714  INR 2.24     Recent Labs Lab 07/14/16 1714  NA 136  K 4.7  CL 97*  CO2 26  BUN 13  CREATININE 0.76  CALCIUM 9.3  GLUCOSE 129*   Magnesium  Date Value Ref Range Status  07/05/2016 1.9 1.7 - 2.4 mg/dL Final    Recent Labs  40/98/11 1724  TROPIPOC 0.09*   ECG:  Atrial fibrillation with controlled response, left bundle branch block pattern:   Radiology:  Dg Chest 2 View Result Date: 07/14/2016 CLINICAL DATA:  Dizziness.  Fall. EXAM: CHEST  2 VIEW COMPARISON:  July 06, 2016 FINDINGS: Cardiomegaly. No pneumothorax. Single lead pacemaker. No other interval changes or acute abnormalities. No overt edema. IMPRESSION: No active cardiopulmonary disease. Electronically Signed   By: Gerome Sam III M.D   On: 07/14/2016 18:19   Ct Head Wo Contrast Result Date: 07/14/2016   IMPRESSION: 1. No acute intracranial abnormality. 2. No fracture or traumatic malalignment in the cervical spine. Electronically Signed   By: Gerome Sam III M.D   On: 07/14/2016 18:27    ASSESSMENT AND PLAN:     1.  Fall without true syncope associated with dizziness in a patient in the early recovery period following TAVR-the differential diagnosis would include some sort of peripheral vertigo, orthostatic hypotension, arrhythmic syncope such as a tachycardia arrhythmia or the remote possibility would've been since her cerebrovascular event associated with emboli at the time of the TAVR although she is not manifesting any changes at the present time. 2.  Recent TAVR for severe aortic stenosis 3.  Chronic atrial fibrillation 4.  Functioning permanent pacemaker 5.  Hypertensive heart disease 6.  Nonspecific elevation of troponin likely not related to her clinical event. 7.  Long-term use of anticoagulation currently therapeutic 8.  Scalp hematoma needs observation  RECOMMENDATIONS:  Patient looks well but lives alone and I think it would be best to be observed for overnight.  We'll obtain orthostatic vital signs and have her pacemaker interrogated in the morning.  Might be worthwhile to have a physical therapy evaluation in the morning to be sure she is safe to go home.    Darden PalmerW. Spencer Jaison Petraglia, Jr. MD [facility] 07/14/2016 8:10 PM

## 2016-07-14 NOTE — ED Triage Notes (Signed)
[facility]- [facility] here from [facility] after [facility]. Pt has large hematoma noted to the right side of her head. She takes Coumadin. Pt had dizzy spell after having a bowel movement.

## 2016-07-14 NOTE — ED Notes (Signed)
Attempted x 2.  

## 2016-07-15 DIAGNOSIS — I482 Chronic atrial fibrillation: Secondary | ICD-10-CM | POA: Diagnosis not present

## 2016-07-15 DIAGNOSIS — W19XXXA Unspecified fall, initial encounter: Secondary | ICD-10-CM

## 2016-07-15 DIAGNOSIS — Z952 Presence of prosthetic heart valve: Secondary | ICD-10-CM

## 2016-07-15 DIAGNOSIS — Z7901 Long term (current) use of anticoagulants: Secondary | ICD-10-CM

## 2016-07-15 DIAGNOSIS — R55 Syncope and collapse: Secondary | ICD-10-CM | POA: Diagnosis present

## 2016-07-15 LAB — BASIC METABOLIC PANEL
Anion gap: 9 (ref 5–15)
BUN: 13 mg/dL (ref 6–20)
CALCIUM: 8.9 mg/dL (ref 8.9–10.3)
CO2: 28 mmol/L (ref 22–32)
CREATININE: 0.72 mg/dL (ref 0.44–1.00)
Chloride: 100 mmol/L — ABNORMAL LOW (ref 101–111)
GFR calc Af Amer: >60 mL/min (ref >60)
Glucose, Bld: 120 mg/dL — ABNORMAL HIGH (ref 65–99)
Potassium: 3.9 mmol/L (ref 3.5–5.1)
SODIUM: 137 mmol/L (ref 135–145)

## 2016-07-15 LAB — CBC
HCT: 34.2 % — ABNORMAL LOW (ref 36.0–46.0)
Hemoglobin: 11.1 g/dL — ABNORMAL LOW (ref 12.0–15.0)
MCH: 29.1 pg (ref 26.0–34.0)
MCHC: 32.5 g/dL (ref 30.0–36.0)
MCV: 89.8 fL (ref 78.0–100.0)
PLATELETS: 254 10*3/uL (ref 150–400)
RBC: 3.81 MIL/uL — ABNORMAL LOW (ref 3.87–5.11)
RDW: 14.4 % (ref 11.5–15.5)
WBC: 8.2 10*3/uL (ref 4.0–10.5)

## 2016-07-15 LAB — PROTIME-INR
INR: 2.35
PROTHROMBIN TIME: 26.1 s — AB (ref 11.4–15.2)

## 2016-07-15 MED ORDER — WARFARIN SODIUM 2.5 MG PO TABS: 2.5000 mg | ORAL_TABLET | Freq: Every day | ORAL | Status: DC

## 2016-07-15 MED ORDER — FUROSEMIDE 40 MG PO TABS: ORAL_TABLET | ORAL | 3 refills | Status: AC

## 2016-07-15 MED ORDER — DIGOXIN 125 MCG PO TABS: 0.1250 mg | ORAL_TABLET | Freq: Every day | ORAL | 6 refills | Status: AC

## 2016-07-15 MED ORDER — FUROSEMIDE 40 MG PO TABS: 40.0000 mg | ORAL_TABLET | Freq: Every day | ORAL | Status: DC

## 2016-07-15 MED ORDER — WARFARIN - PHARMACIST DOSING INPATIENT: Freq: Every day | Status: DC

## 2016-07-15 MED ORDER — DILTIAZEM HCL ER COATED BEADS 240 MG PO CP24: 240.0000 mg | ORAL_CAPSULE | Freq: Every day | ORAL | 6 refills | Status: AC

## 2016-07-15 NOTE — Evaluation (Signed)
Physical Therapy Evaluation Patient Details Name: [name] MRN: [id number] DOB: January 23, 1927 Today's Date: 07/15/2016   History of Present Illness  Patient is an 82 yo female admitted 07/14/16 following fall.  Patient reports she turned quickly in bathroom, became dizzy and fell.  Patient with Rt forehead hematoma.    PMH:  recent TAVR for severe AS, chronic Afib, pacemaker, HTN, DM, CHF, peripheral neuropathy  Clinical Impression  Patient presents with problems listed below, including decreased balance impacting functional mobility.  Patient to d/c home today.  Recommend f/u HHPT for mobility and balance training.  Patient instructed to use cane for gait at all times for safety.    Follow Up Recommendations Home health PT;Supervision - Intermittent    Equipment Recommendations  None recommended by PT    Recommendations for Other Services       Precautions / Restrictions Precautions Precautions: Fall Restrictions Weight Bearing Restrictions: No      Mobility  Bed Mobility               General bed mobility comments: Patient in chair  Transfers Overall transfer level: Modified independent Equipment used: None             General transfer comment: Increased time.  Good balance in stance.  Ambulation/Gait Ambulation/Gait assistance: Min guard Ambulation Distance (Feet): 160 Feet Assistive device: None Gait Pattern/deviations: Step-through pattern;Staggering left;Staggering right   Gait velocity interpretation: at or above normal speed for age/gender General Gait Details: Patient with quick-paced gait.  Verbal cues to slow to safer speed.  Patient noted to stagger to both sides, especially with head turns.  Able to regain balance.  Encouraged patient to use her cane for gait at d/c.  Stairs            Wheelchair Mobility    Modified Rankin (Stroke Patients Only)       Balance Overall balance assessment: Needs assistance         Standing  balance support: No upper extremity supported Standing balance-Leahy Scale: Good Standing balance comment: Decreased balance with high level activities             High level balance activites: Backward walking;Direction changes;Turns;Sudden stops;Head turns High Level Balance Comments: Decreased balance with high level balance activities.  Able to regain balance.             Pertinent Vitals/Pain Pain Assessment: No/denies pain    Home Living Family/patient expects to be discharged to:: Private residence Living Arrangements: Alone Available Help at Discharge: Friend(s);Available PRN/intermittently Type of Home: House Home Access: Stairs to enter   [employer] of Steps: 1 Home Layout: One level Home Equipment: Cane - single point;Grab bars - tub/shower      Prior Function Level of Independence: Independent         Comments: Active.  Works in yard     Journalist, newspaper        Extremity/Trunk Assessment   Upper Extremity Assessment: Overall WFL for tasks assessed           Lower Extremity Assessment: Overall WFL for tasks assessed         Communication   Communication: No difficulties  Cognition Arousal/Alertness: Awake/alert Behavior During Therapy: WFL for tasks assessed/performed (Very talkative) Overall Cognitive Status: Within Functional Limits for tasks assessed                      General Comments      Exercises  Assessment/Plan    PT Assessment All further PT needs can be met in the next venue of care  PT Problem List Decreased balance;Decreased mobility;Decreased knowledge of use of DME;Impaired sensation          PT Treatment Interventions      PT Goals (Current goals can be found in the Care Plan section)  Acute Rehab PT Goals PT Goal Formulation: All assessment and education complete, DC therapy (Patient to continue with HHPT)    Frequency     Barriers to discharge        Co-evaluation                End of Session Equipment Utilized During Treatment: Gait belt Activity Tolerance: Patient tolerated treatment well Patient left: in chair;with call bell/phone within reach Nurse Communication: Mobility status (Needs HHPT)    Functional Assessment Tool Used: Clinical judgement Functional Limitation: Mobility: Walking and moving around Mobility: Walking and Moving Around Current Status (D4370): At least 1 percent but less than 20 percent impaired, limited or restricted Mobility: Walking and Moving Around Goal Status [phone]): At least 1 percent but less than 20 percent impaired, limited or restricted Mobility: Walking and Moving Around Discharge Status [phone]): At least 1 percent but less than 20 percent impaired, limited or restricted    Time: 1450-1505 PT Time Calculation (min) (ACUTE ONLY): 15 min   Charges:   PT Evaluation $PT Eval Low Complexity: 1 Procedure     PT G Codes:   PT G-Codes **NOT FOR INPATIENT CLASS** Functional Assessment Tool Used: Clinical judgement Functional Limitation: Mobility: Walking and moving around Mobility: Walking and Moving Around Current Status (K2284): At least 1 percent but less than 20 percent impaired, limited or restricted Mobility: Walking and Moving Around Goal Status [phone]): At least 1 percent but less than 20 percent impaired, limited or restricted Mobility: Walking and Moving Around Discharge Status [phone]): At least 1 percent but less than 20 percent impaired, limited or restricted    Despina Pole 07/15/2016, 4:26 PM Carita Pian. Sanjuana Kava, [place] Pager [phone]

## 2016-07-15 NOTE — Progress Notes (Signed)
ANTICOAGULATION CONSULT NOTE - Initial Consult  Pharmacy Consult for Coumadin Indication: atrial fibrillation  Allergies  Allergen Reactions  . Amiodarone Other (See Comments)    Stops her heart  . Aspirin Other (See Comments)    Gi bleeding  . Codeine Other (See Comments)    GI Lead  . Tramadol Nausea And Vomiting  . Lipitor [Atorvastatin Calcium] Other (See Comments)    Muscle Ache  . Pravachol Other (See Comments)    Muscle Ache    Patient Measurements: Weight: 143 lb 11.2 oz (65.2 kg)  Vital Signs: Temp: 97.8 F (36.6 C) (10/14 1259) Temp Source: Oral (10/14 1259) BP: 132/62 (10/14 1259) Pulse Rate: 87 (10/14 1259)  Labs:  Recent Labs  07/14/16 1714 07/15/16 0407  HGB 12.5 11.1*  HCT 37.9 34.2*  PLT 284 254  LABPROT 25.1* 26.1*  INR 2.24 2.35  CREATININE 0.76 0.72    Estimated Creatinine Clearance: 46.4 mL/min (by C-G formula based on SCr of 0.72 mg/dL).   Medical History: Past Medical History:  Diagnosis Date  . Arthritis   . Chronic diastolic congestive heart failure (HCC)   . Diabetes mellitus   . History of peptic ulcer disease   . Hypercholesteremia   . Hyperlipidemia   . Hypertension   . Hypothyroidism   . Peripheral neuropathy (HCC)   . Permanent atrial fibrillation (HCC)   . Presence of permanent cardiac pacemaker   . 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 Severe aortic stenosis   . Tachycardia-bradycardia syndrome (HCC)    a. s/p SJM single chamber PPM implanted by Dr Reyes IvanKersey 2009  . Thrombocytopenia (HCC)   . Thyroid disease     Medications:  Prescriptions Prior to Admission  Medication Sig Dispense Refill Last Dose  . amoxicillin (AMOXIL) 500 MG capsule Take 500 mg by mouth daily as needed (for dental appointments).    prn  . calcium carbonate (OS-CAL) 600 MG TABS Take 1,200 mg by mouth 3 (three) times a  week.    Past Week at Unknown time  . digoxin (LANOXIN) 0.25 MG tablet Take 1 tablet (0.25 mg total) by mouth daily. 120 tablet 0 07/14/2016 at Unknown time  . diltiazem (CARDIZEM CD) 360 MG 24 hr capsule Take 1 capsule (360 mg total) by mouth daily. 30 capsule 12 07/14/2016 at 0830  . furosemide (LASIX) 40 MG tablet Take ONE tablet by mouth every other day alternating with TWO tablets every other day. (Patient taking differently: Take 40-80 mg by mouth See admin instructions. Takes 40 mg by mouth every other day alternating with 80 mg the other days.) 135 tablet 3 07/14/2016 at Unknown time  . gabapentin (NEURONTIN) 300 MG capsule Take 300-900 mg by mouth See admin instructions. Take 300 mg by mouth in the morning and take 900 mg by mouth in the evening.   07/14/2016 at Unknown time  . levothyroxine (SYNTHROID, LEVOTHROID) 50 MCG tablet Take 50 mcg by mouth daily.     07/14/2016 at Unknown time  . lisinopril (PRINIVIL,ZESTRIL) 2.5 MG tablet Take 2.5 mg by mouth daily.   07/14/2016 at Unknown time  . metFORMIN (GLUCOPHAGE) 500 MG tablet Take 1,000 mg by mouth 2 (two) times daily.    07/14/2016 at Unknown time  . metoprolol (LOPRESSOR) 100 MG tablet Take 1 tablet (100 mg total) by mouth 2 (two) times daily. 180 tablet 0 07/14/2016 at Unknown time  .  Multiple Vitamins-Minerals (VISION-VITE PRESERVE PO) Take 1 capsule by mouth 2 (two) times daily.   07/14/2016 at Unknown time  . potassium chloride SA (K-DUR,KLOR-CON) 20 MEQ tablet Take 1 tablet (20 mEq total) by mouth 2 (two) times daily. 60 tablet 12 07/14/2016 at Unknown time  . psyllium (METAMUCIL) 58.6 % powder Take 1 packet by mouth daily.   07/13/2016 at Unknown time  . simvastatin (ZOCOR) 10 MG tablet Take 1 tablet (10 mg total) by mouth daily. 30 tablet 12 07/13/2016 at Unknown time  . warfarin (COUMADIN) 5 MG tablet Take 1 tablet (5 mg total) by mouth daily at 6 PM. (Patient taking differently: Take 2.5 mg by mouth daily at 6 PM. ) 30 tablet 3  07/13/2016 at Unknown time    Assessment: 82 y/o F with chronic afib on Coumadin PTA presents with dizziness resulting in a fall in the bathroom at [facility]. Sustained a hematoma of R forehead.  Anticoag: Afib. S/p TAVR 10/3. R forehead hematoma s/p fall. Admit INR 2.24. INR today 2.35 (no dose 10/13). Hgb 12.5>11.1 overnight. - PTA dose Coumadin 2.5mg  daily  Goal of Therapy:  INR 2-3 Monitor platelets by anticoagulation protocol: Yes   Plan:  Resume Coumadin 2.5mg  po daily (INR may drop with no dose on 10/13) Daily INR   [name] S. [name], PharmD, BCPS Clinical Staff Pharmacist Pager [phone]  [name] 07/15/2016,1:26 PM

## 2016-07-15 NOTE — Progress Notes (Signed)
Pt. States that patient needs home health PT  Paged PA

## 2016-07-15 NOTE — Discharge Summary (Signed)
Discharge Summary    Patient ID: [name],  MRN: [id number], DOB/AGE: May 23, 1927 82 y.o.  Admit date: 07/14/2016 Discharge date: 07/15/2016  Primary Care Provider: Lorenda Peck Primary Cardiologist: Dr. Jens Som  Discharge Diagnoses    Principal Problem:   Near syncope Active Problems:   Hyperlipidemia   Chronic atrial fibrillation (HCC)   Tachycardia-bradycardia syndrome (HCC)   St. Jude pacer   S/P TAVR (transcatheter aortic valve replacement)   Current use of long term anticoagulation   Fall   Allergies Allergies  Allergen Reactions  . Amiodarone Other (See Comments)    Stops her heart  . Aspirin Other (See Comments)    Gi bleeding  . Codeine Other (See Comments)    GI Lead  . Tramadol Nausea And Vomiting  . Lipitor [Atorvastatin Calcium] Other (See Comments)    Muscle Ache  . Pravachol Other (See Comments)    Muscle Ache    Diagnostic Studies/Procedures    None _____________   History of Present Illness     [name] is a 82 y.o. year old female with a history of chronic persistent a fib (on coumadin), tachy-brady syndrome s/p SJC PPM, DM, HTN, and HLD. Also had severe AS.  D/c 10/06 after TAVR was performed on 10/03. She did well after discharge. She has been walking as directed. She has been feeling well. No DOE, weight has been stable. No changes in her meds. She was on a higher dose of Cardizem at discharge, other meds the same. She has not had palpitations.   On 10/13, she felt dizzy at home when she was up and moving around. She did not fall and was not in danger of losing consciousness.  She went to [employer] with friends she had been doing so this week, had been sitting there for a couple of hours had got up and moved around and changed chairs.. She had a couple of sodas and a cup of coffee. She needed to use the restroom, but when she got there, she was extremely dizzy. She tried to sit on the toilet, but fell. She was  more alert then but very weak. She was able to get to the rail, and pull herself up. The dizziness had resolved. It did not come back..  She describes the room as spinning and she was severely dizzy.  But was able to walk back out and motion to her friends.  She was transported here where she has had a CT scan that did not show any acute abnormality.  EKG showed atrial fibrillation.  She currently feels fine except has a large hematoma of her right forehead.  She did not have any chest pain or shortness of breath.  She has actually felt quite well since the TAVR.  Hospital Course     Consultants: None  She was admitted for observation overnight and noted to be slightly orthostatic. Labs were noted stable the following morning. Telemetry was reviewed and noted AF with rate controlled. In talking with the patient, she felt as though she may have turned the wrong way and lost her balance while getting up, suspected vertigo. Given she was mildly orthostatic her lasix was reduced to 40mg  daily. Her digoxin was also reduced from 0.25 to 0.125mg  daily, along with diltiazem from 360mg  to 240mg  daily on admission.   She was seen and assessed by Dr. [place] on 10/14 and determined stable for discharge home. She worked with PT who recommended that she  has home health PT at discharge. I have sent a message to arrange for 2 weeks hospital follow up in the office.  _____________  Discharge Vitals Blood pressure 132/62, pulse 87, temperature 97.8 F (36.6 C), temperature source Oral, resp. rate 19, weight 143 lb 11.2 oz (65.2 kg), SpO2 100 %.  Filed Weights   07/14/16 2236  Weight: 143 lb 11.2 oz (65.2 kg)    Labs & Radiologic Studies    CBC  Recent Labs  07/14/16 1714 07/15/16 0407  WBC 12.0* 8.2  NEUTROABS 9.2*  --   HGB 12.5 11.1*  HCT 37.9 34.2*  MCV 90.7 89.8  PLT 284 254   Basic Metabolic Panel  Recent Labs  07/14/16 1714 07/15/16 0407  NA 136 137  K 4.7 3.9  CL 97* 100*  CO2 26 28    GLUCOSE 129* 120*  BUN 13 13  CREATININE 0.76 0.72  CALCIUM 9.3 8.9   Liver Function Tests No results for input(s): AST, ALT, ALKPHOS, BILITOT, PROT, ALBUMIN in the last 72 hours. No results for input(s): LIPASE, AMYLASE in the last 72 hours. Cardiac Enzymes No results for input(s): CKTOTAL, CKMB, CKMBINDEX, TROPONINI in the last 72 hours. BNP Invalid input(s): POCBNP D-Dimer No results for input(s): DDIMER in the last 72 hours. Hemoglobin A1C No results for input(s): HGBA1C in the last 72 hours. Fasting Lipid Panel No results for input(s): CHOL, HDL, LDLCALC, TRIG, CHOLHDL, LDLDIRECT in the last 72 hours. Thyroid Function Tests No results for input(s): TSH, T4TOTAL, T3FREE, THYROIDAB in the last 72 hours.  Invalid input(s): FREET3 _____________  Dg Chest 2 View  Result Date: 07/14/2016 CLINICAL DATA:  Dizziness.  Fall. EXAM: CHEST  2 VIEW COMPARISON:  July 06, 2016 FINDINGS: Cardiomegaly. No pneumothorax. Single lead pacemaker. No other interval changes or acute abnormalities. No overt edema. IMPRESSION: No active cardiopulmonary disease. Electronically Signed   By: Gerome Sam III M.D   On: 07/14/2016 18:19   Ct Head Wo Contrast  Result Date: 07/14/2016 CLINICAL DATA:  Pain after fall. EXAM: CT HEAD WITHOUT CONTRAST CT CERVICAL SPINE WITHOUT CONTRAST TECHNIQUE: Multidetector CT imaging of the head and cervical spine was performed following the standard protocol without intravenous contrast. Multiplanar CT image reconstructions of the cervical spine were also generated. COMPARISON:  None. FINDINGS: CT HEAD FINDINGS Brain: No subdural, epidural, or subarachnoid hemorrhage. Ventricles and sulci are normal for age. Cerebellum, brainstem, and basal cisterns are normal. Scattered mild white matter changes. No evidence of acute cortical ischemia or infarct. No mass, mass effect, or midline shift. Vascular: Calcified atherosclerosis in the intracranial carotid arteries. Skull:  Normal. Negative for fracture or focal lesion. Sinuses/Orbits: No acute finding. Other: There is a hematoma over the right scalp. Extracranial soft tissues otherwise normal. CT CERVICAL SPINE FINDINGS Alignment: Normal. Skull base and vertebrae: No acute fracture. No primary bone lesion or focal pathologic process. Soft tissues and spinal canal: No prevertebral fluid or swelling. No visible canal hematoma. Disc levels:  Multilevel degenerative changes. Upper chest: Negative. Other: No other abnormalities. IMPRESSION: 1. No acute intracranial abnormality. 2. No fracture or traumatic malalignment in the cervical spine. Electronically Signed   By: Gerome Sam III M.D   On: 07/14/2016 18:27   Ct Cervical Spine Wo Contrast  Result Date: 07/14/2016 CLINICAL DATA:  Pain after fall. EXAM: CT HEAD WITHOUT CONTRAST CT CERVICAL SPINE WITHOUT CONTRAST TECHNIQUE: Multidetector CT imaging of the head and cervical spine was performed following the standard protocol without intravenous contrast. Multiplanar CT  image reconstructions of the cervical spine were also generated. COMPARISON:  None. FINDINGS: CT HEAD FINDINGS Brain: No subdural, epidural, or subarachnoid hemorrhage. Ventricles and sulci are normal for age. Cerebellum, brainstem, and basal cisterns are normal. Scattered mild white matter changes. No evidence of acute cortical ischemia or infarct. No mass, mass effect, or midline shift. Vascular: Calcified atherosclerosis in the intracranial carotid arteries. Skull: Normal. Negative for fracture or focal lesion. Sinuses/Orbits: No acute finding. Other: There is a hematoma over the right scalp. Extracranial soft tissues otherwise normal. CT CERVICAL SPINE FINDINGS Alignment: Normal. Skull base and vertebrae: No acute fracture. No primary bone lesion or focal pathologic process. Soft tissues and spinal canal: No prevertebral fluid or swelling. No visible canal hematoma. Disc levels:  Multilevel degenerative changes.  Upper chest: Negative. Other: No other abnormalities. IMPRESSION: 1. No acute intracranial abnormality. 2. No fracture or traumatic malalignment in the cervical spine. Electronically Signed   By: Gerome Sam III M.D   On: 07/14/2016 18:27      Disposition   Pt is being discharged home today in good condition.  Follow-up Plans & Appointments    Follow-up Information    [name], MD .   Specialty:  Cardiology Why:  The office will call with a follow up appt in 2-3 days. Please give [place] a call if you have not received a call within that time frame.  Contact information: [address] [place] [address] [phone]          Discharge Instructions    Diet - low sodium heart healthy    Complete by:  As directed    Face-to-face encounter (required for Medicare/Medicaid patients)    Complete by:  As directed    I Laverda Page certify that this patient is under my care and that I, or a nurse practitioner or physician's assistant working with me, had a face-to-face encounter that meets the physician face-to-face encounter requirements with this patient on 07/15/2016. The encounter with the patient was in whole, or in part for the following medical condition(s) which is the primary reason for home health care (List medical condition): Weakness   The encounter with the patient was in whole, or in part, for the following medical condition, which is the primary reason for home health care:  Weakness   I certify that, based on my findings, the following services are medically necessary home health services:  Physical therapy   Reason for Medically Necessary Home Health Services:  Therapy- Investment banker, operational, Patent examiner   My clinical findings support the need for the above services:  Unsafe ambulation due to balance issues   Further, I certify that my clinical findings support that this patient is homebound due to:  Unsafe ambulation due to balance  issues   Home Health    Complete by:  As directed    To provide the following care/treatments:  PT   Increase activity slowly    Complete by:  As directed       Discharge Medications   Current Discharge Medication List    CONTINUE these medications which have CHANGED   Details  digoxin (LANOXIN) 0.125 MG tablet Take 1 tablet (0.125 mg total) by mouth daily. Qty: 30 tablet, Refills: 6    diltiazem (CARDIZEM CD) 240 MG 24 hr capsule Take 1 capsule (240 mg total) by mouth daily. Qty: 30 capsule, Refills: 6    furosemide (LASIX) 40 MG tablet Begin taking 40mg  daily Qty:  135 tablet, Refills: 3      CONTINUE these medications which have NOT CHANGED   Details  amoxicillin (AMOXIL) 500 MG capsule Take 500 mg by mouth daily as needed (for dental appointments).     calcium carbonate (OS-CAL) 600 MG TABS Take 1,200 mg by mouth 3 (three) times a week.     gabapentin (NEURONTIN) 300 MG capsule Take 300-900 mg by mouth See admin instructions. Take 300 mg by mouth in the morning and take 900 mg by mouth in the evening.    levothyroxine (SYNTHROID, LEVOTHROID) 50 MCG tablet Take 50 mcg by mouth daily.      lisinopril (PRINIVIL,ZESTRIL) 2.5 MG tablet Take 2.5 mg by mouth daily.    metFORMIN (GLUCOPHAGE) 500 MG tablet Take 1,000 mg by mouth 2 (two) times daily.     metoprolol (LOPRESSOR) 100 MG tablet Take 1 tablet (100 mg total) by mouth 2 (two) times daily. Qty: 180 tablet, Refills: 0    Multiple Vitamins-Minerals (VISION-VITE PRESERVE PO) Take 1 capsule by mouth 2 (two) times daily.    potassium chloride SA (K-DUR,KLOR-CON) 20 MEQ tablet Take 1 tablet (20 mEq total) by mouth 2 (two) times daily. Qty: 60 tablet, Refills: 12    psyllium (METAMUCIL) 58.6 % powder Take 1 packet by mouth daily.    simvastatin (ZOCOR) 10 MG tablet Take 1 tablet (10 mg total) by mouth daily. Qty: 30 tablet, Refills: 12    warfarin (COUMADIN) 5 MG tablet Take 1 tablet (5 mg total) by mouth daily at 6  PM. Qty: 30 tablet, Refills: 3      STOP taking these medications     pantoprazole (PROTONIX) 40 MG tablet          Outstanding Labs/Studies   None  Duration of Discharge Encounter   Greater than 30 minutes including physician time.  Signed, Laverda PageLindsay Roberts NP-C 07/15/2016, 3:28 PM

## 2016-07-15 NOTE — Progress Notes (Signed)
CM spoke with pt to offer choice of home health agency.  Pt chooses [facility] to render HHPT.  [facility]  Pt denies need for any DMe. No other CM needs were communicated.

## 2016-07-15 NOTE — Progress Notes (Signed)
Pt. Discharged to home  Pt. D/C'd via wheelchair with me Discharge information reviewed and given All personal belongings given to Pt.  Education discussed and reviewed with teach back  IV was d/c Tele d/c

## 2016-07-15 NOTE — Progress Notes (Signed)
TELEMETRY: Reviewed telemetry pt in atrial fibrillation with controlled rate: Vitals:   07/14/16 2130 07/14/16 2145 07/14/16 2236 07/15/16 0300  BP:  (!) 137/54 (!) 157/64 125/88  Pulse: 75 91 92 76  Resp: 17 13 16 16   Temp:   97.8 F (36.6 C) 98.5 F (36.9 C)  TempSrc:   Oral Oral  SpO2: 97% 99% 96% 98%  Weight:   143 lb 11.2 oz (65.2 kg)     Intake/Output Summary (Last 24 hours) at 07/15/16 1049 Last data filed at 07/15/16 [id number]  Gross per 24 hour  Intake                0 ml  Output                0 ml  Net                0 ml   Filed Weights   07/14/16 2236  Weight: 143 lb 11.2 oz (65.2 kg)    Subjective  Feels very well this am. States she really has felt great since TAVR. Thinks this episode yesterday occurred when she tried to turn too quickly getting on the toilet. Today no dizziness, lightheadedness, chest pain, SOB, HA.  Marland Kitchen. aspirin EC  81 mg Oral Daily  . [START ON 07/17/2016] calcium carbonate  1,250 mg Oral Once per day on Mon Wed Fri  . digoxin  0.125 mg Oral Daily  . diltiazem  240 mg Oral Daily  . [START ON 07/16/2016] furosemide  40 mg Oral Daily  . gabapentin  300 mg Oral Daily   And  . gabapentin  900 mg Oral QHS  . levothyroxine  50 mcg Oral QAC breakfast  . lisinopril  2.5 mg Oral Daily  . metFORMIN  1,000 mg Oral BID WC  . metoprolol  100 mg Oral BID  . pantoprazole  40 mg Oral Daily  . potassium chloride SA  20 mEq Oral BID  . simvastatin  10 mg Oral Daily      LABS: Basic Metabolic Panel:  Recent Labs  86/57/8410/13/17 1714 07/15/16 0407  NA 136 137  K 4.7 3.9  CL 97* 100*  CO2 26 28  GLUCOSE 129* 120*  BUN 13 13  CREATININE 0.76 0.72  CALCIUM 9.3 8.9   Liver Function Tests: No results for input(s): AST, ALT, ALKPHOS, BILITOT, PROT, ALBUMIN in the last 72 hours. No results for input(s): LIPASE, AMYLASE in the last 72 hours. CBC:  Recent Labs  07/14/16 1714 07/15/16 0407  WBC 12.0* 8.2  NEUTROABS 9.2*  --   HGB 12.5 11.1*  HCT  37.9 34.2*  MCV 90.7 89.8  PLT 284 254   Cardiac Enzymes: No results for input(s): CKTOTAL, CKMB, CKMBINDEX, TROPONINI in the last 72 hours. BNP: No results for input(s): PROBNP in the last 72 hours. D-Dimer: No results for input(s): DDIMER in the last 72 hours. Hemoglobin A1C: No results for input(s): HGBA1C in the last 72 hours. Fasting Lipid Panel: No results for input(s): CHOL, HDL, LDLCALC, TRIG, CHOLHDL, LDLDIRECT in the last 72 hours. Thyroid Function Tests: No results for input(s): TSH, T4TOTAL, T3FREE, THYROIDAB in the last 72 hours.  Invalid input(s): FREET3   Radiology/Studies:  Dg Chest 2 View  Result Date: 07/14/2016 CLINICAL DATA:  Dizziness.  Fall. EXAM: CHEST  2 VIEW COMPARISON:  July 06, 2016 FINDINGS: Cardiomegaly. No pneumothorax. Single lead pacemaker. No other interval changes or acute abnormalities. No overt edema. IMPRESSION: No active  cardiopulmonary disease. Electronically Signed   By: Gerome Sam III M.D   On: 07/14/2016 18:19   Ct Head Wo Contrast  Result Date: 07/14/2016 CLINICAL DATA:  Pain after fall. EXAM: CT HEAD WITHOUT CONTRAST CT CERVICAL SPINE WITHOUT CONTRAST TECHNIQUE: Multidetector CT imaging of the head and cervical spine was performed following the standard protocol without intravenous contrast. Multiplanar CT image reconstructions of the cervical spine were also generated. COMPARISON:  None. FINDINGS: CT HEAD FINDINGS Brain: No subdural, epidural, or subarachnoid hemorrhage. Ventricles and sulci are normal for age. Cerebellum, brainstem, and basal cisterns are normal. Scattered mild white matter changes. No evidence of acute cortical ischemia or infarct. No mass, mass effect, or midline shift. Vascular: Calcified atherosclerosis in the intracranial carotid arteries. Skull: Normal. Negative for fracture or focal lesion. Sinuses/Orbits: No acute finding. Other: There is a hematoma over the right scalp. Extracranial soft tissues otherwise  normal. CT CERVICAL SPINE FINDINGS Alignment: Normal. Skull base and vertebrae: No acute fracture. No primary bone lesion or focal pathologic process. Soft tissues and spinal canal: No prevertebral fluid or swelling. No visible canal hematoma. Disc levels:  Multilevel degenerative changes. Upper chest: Negative. Other: No other abnormalities. IMPRESSION: 1. No acute intracranial abnormality. 2. No fracture or traumatic malalignment in the cervical spine. Electronically Signed   By: Gerome Sam III M.D   On: 07/14/2016 18:27   Ct Cervical Spine Wo Contrast  Result Date: 07/14/2016 CLINICAL DATA:  Pain after fall. EXAM: CT HEAD WITHOUT CONTRAST CT CERVICAL SPINE WITHOUT CONTRAST TECHNIQUE: Multidetector CT imaging of the head and cervical spine was performed following the standard protocol without intravenous contrast. Multiplanar CT image reconstructions of the cervical spine were also generated. COMPARISON:  None. FINDINGS: CT HEAD FINDINGS Brain: No subdural, epidural, or subarachnoid hemorrhage. Ventricles and sulci are normal for age. Cerebellum, brainstem, and basal cisterns are normal. Scattered mild white matter changes. No evidence of acute cortical ischemia or infarct. No mass, mass effect, or midline shift. Vascular: Calcified atherosclerosis in the intracranial carotid arteries. Skull: Normal. Negative for fracture or focal lesion. Sinuses/Orbits: No acute finding. Other: There is a hematoma over the right scalp. Extracranial soft tissues otherwise normal. CT CERVICAL SPINE FINDINGS Alignment: Normal. Skull base and vertebrae: No acute fracture. No primary bone lesion or focal pathologic process. Soft tissues and spinal canal: No prevertebral fluid or swelling. No visible canal hematoma. Disc levels:  Multilevel degenerative changes. Upper chest: Negative. Other: No other abnormalities. IMPRESSION: 1. No acute intracranial abnormality. 2. No fracture or traumatic malalignment in the cervical  spine. Electronically Signed   By: Gerome Sam III M.D   On: 07/14/2016 18:27    PHYSICAL EXAM General: Well developed, elderly but very spry, in no acute distress. Head: Normocephalic, hematoma right forehead., sclera non-icteric, oropharynx is clear Neck: Negative for carotid bruits. JVD not elevated. No adenopathy Lungs: Clear bilaterally to auscultation without wheezes, rales, or rhonchi. Breathing is unlabored. Heart: IRRR S1 S2 with 1/6 systolic murmur. no rubs or gallops.  Abdomen: Soft, non-tender, non-distended with normoactive bowel sounds. No hepatomegaly. No rebound/guarding. No obvious abdominal masses. Msk:  Strength and tone appears normal for age. Extremities: No clubbing, cyanosis or edema.  Distal pedal pulses are 2+ and equal bilaterally. Neuro: Alert and oriented X 3. Moves all extremities spontaneously. Psych:  Responds to questions appropriately with a normal affect.  ASSESSMENT AND PLAN: 1. Fall without true syncope. I suspect more related to some vertigo. No arrhythmia noted on monitor. She is mildly  orthostatic so will reduce lasix to 40 mg daily. Neurologically intact and CT negative.  2. Recent TAVR for severe AS 3. Chronic Afib. Rate controlled. 4. S/p pacemaker. 5. HTN 6. Long term coumadin therapy 7. Right forehead hematoma. Stable.  Plan: will reduce lasix as noted. Plan PT/ambulation today. If steady on feet I think she can be DC later today. She has close follow up scheduled on Monday.  Present on Admission: . Chronic atrial fibrillation (HCC) . Tachycardia-bradycardia syndrome (HCC) . Hyperlipidemia . St. Jude pacer . Near syncope   Signed, Jaeleen Inzunza [place], MDFACC 07/15/2016 10:49 AM

## 2016-07-17 ENCOUNTER — Encounter: Payer: Self-pay | Admitting: Physician Assistant

## 2016-07-17 ENCOUNTER — Ambulatory Visit (INDEPENDENT_AMBULATORY_CARE_PROVIDER_SITE_OTHER): Payer: Medicare Other | Admitting: Pharmacist

## 2016-07-17 ENCOUNTER — Ambulatory Visit (INDEPENDENT_AMBULATORY_CARE_PROVIDER_SITE_OTHER): Payer: Medicare Other | Admitting: Physician Assistant

## 2016-07-17 ENCOUNTER — Telehealth: Payer: Self-pay | Admitting: Cardiology

## 2016-07-17 VITALS — BP 128/60 | HR 65 | Ht 66.0 in | Wt 143.4 lb

## 2016-07-17 DIAGNOSIS — R42 Dizziness and giddiness: Secondary | ICD-10-CM | POA: Diagnosis not present

## 2016-07-17 DIAGNOSIS — I482 Chronic atrial fibrillation, unspecified: Secondary | ICD-10-CM

## 2016-07-17 DIAGNOSIS — R55 Syncope and collapse: Secondary | ICD-10-CM

## 2016-07-17 DIAGNOSIS — Z5181 Encounter for therapeutic drug level monitoring: Secondary | ICD-10-CM | POA: Diagnosis not present

## 2016-07-17 DIAGNOSIS — Z7901 Long term (current) use of anticoagulants: Secondary | ICD-10-CM

## 2016-07-17 LAB — POCT INR: INR: 2

## 2016-07-17 NOTE — Telephone Encounter (Signed)
New message   RN is calling to a level 1 drug intraction ( Simvastatin & CartiaXT)   Order: for skilled rn evaluation and to treat  And continue home health PT  Can call with verbal order

## 2016-07-17 NOTE — Patient Instructions (Signed)
Medication Instructions:   NO CHANGE  Follow-Up:  Your physician recommends that you schedule a follow-up appointment in: 3 MONTHS WITH DR CRENSHAW   TAKE YOUR BLOOD PRESSURE WHEN YOU GET DIZZY  NO DRIVING UNTIL CLEARED BY DOCTOR

## 2016-07-17 NOTE — Progress Notes (Signed)
Cardiology Office Note   Date:  07/17/2016   ID:  [name], DOB [birth year], MRN [id number]  PCP:  Lorenda PeckOBERTS, RONALD WAYNE, MD  TAVR: Dr Excell Seltzerooper Cardiologist:  Dr Rosemarie Beathrenshaw  Cydnie Deason, PA-C   Chief Complaint  Patient presents with  . Follow-up    OFFICE VISIT    History of Present Illness: [name] is a 82 y.o. female with a history of chronic persistent a fib (on coumadin), tachy-brady syndrome s/p SJC PPM, DM, HTN, HLD, and severe AS. S/p TAVR 10/03, d/c 10/06  Admitted 10/13-10/14 after near-syncope, observed overnight, slightly orthostatic, possible vertigo and deconditioning, home PT ordered.  [name] presents for Post hospital follow-up.  Since discharge from the hospital she has done fairly well. She has not had any other episodes of near syncope. She will get dizzy when she bends over, or turns too quickly. When she changes position, she will stand for a brief time to make sure that she is steady on her feet, but does not experience significant dizziness.  She has an appointment with physical therapy today. She admits that she is not that strong since the surgery and is trying to get her strength back. Her usual activity level is quite good, she manages a large yard by herself, doing all the mowing, mulching, and other tasks.  She is concerned about when she can drive.  She has not had chest pain or palpitations. She feels much better than she did before the TAVR surgery.   Past Medical History:  Diagnosis Date  . Arthritis   . Chronic diastolic congestive heart failure (HCC)   . Diabetes mellitus   . History of peptic ulcer disease   . Hypercholesteremia   . Hyperlipidemia   . Hypertension   . Hypothyroidism   . Peripheral neuropathy (HCC)   . Permanent atrial fibrillation (HCC)   . Presence of permanent cardiac pacemaker   . 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 Severe aortic stenosis   . Tachycardia-bradycardia syndrome (HCC)    a. s/p SJM single chamber PPM implanted by Dr Reyes IvanKersey 2009  . Thrombocytopenia (HCC)   . Thyroid disease     Past Surgical History:  Procedure Laterality Date  . ABDOMINAL HYSTERECTOMY    . APPENDECTOMY    . BREAST BIOPSY  02/22/2007    left  . CARDIAC CATHETERIZATION N/A 06/14/2016   Procedure: Left Heart Cath and Coronary Angiography;  Surgeon: Tonny BollmanMichael Cooper, MD;  [facility];  Service: Cardiovascular;  Laterality: N/A;  . CARDIOVERSION  05/16/2005   Electrical cardioversion   . ESOPHAGOGASTRODUODENOSCOPY  03/12/2007    Esophagogastroduodenoscopy with control of bleeding  . PACEMAKER INSERTION  06/18/2008   SJM Zephyr XL SR implanted by Dr Reyes IvanKersey  . SHOULDER SURGERY  12/07/2004  . TEE WITHOUT CARDIOVERSION N/A 07/04/2016   Procedure: TRANSESOPHAGEAL ECHOCARDIOGRAM (TEE);  Surgeon: Tonny BollmanMichael Cooper, MD;  [facility];  Service: Open Heart Surgery;  Laterality: N/A;  . TRANSCATHETER AORTIC VALVE REPLACEMENT, TRANSFEMORAL N/A 07/04/2016   Procedure: TRANSCATHETER AORTIC VALVE REPLACEMENT, TRANSFEMORAL;  Surgeon: Tonny BollmanMichael Cooper, MD;  [facility];  Service: Open Heart Surgery;  Laterality: N/A;    Current Outpatient Prescriptions  Medication Sig Dispense Refill  . amoxicillin (AMOXIL) 500 MG capsule Take 500 mg by mouth daily as needed (for dental appointments).     . calcium  carbonate (OS-CAL) 600 MG TABS Take 1,200 mg by mouth 3 (three) times a week.     . digoxin (LANOXIN) 0.125 MG tablet Take 1 tablet (0.125 mg total) by mouth daily. 30 tablet 6  . diltiazem (CARDIZEM CD) 240 MG 24 hr capsule Take 1 capsule (240 mg total) by mouth daily. 30 capsule 6  . furosemide (LASIX) 40 MG tablet Begin taking 40mg  daily 135 tablet 3  . gabapentin (NEURONTIN) 300 MG capsule Take 300-900 mg by mouth See admin instructions. Take 300 mg by mouth in the  morning and take 900 mg by mouth in the evening.    Marland Kitchen levothyroxine (SYNTHROID, LEVOTHROID) 50 MCG tablet Take 50 mcg by mouth daily.      Marland Kitchen lisinopril (PRINIVIL,ZESTRIL) 2.5 MG tablet Take 2.5 mg by mouth daily.    . metFORMIN (GLUCOPHAGE) 500 MG tablet Take 1,000 mg by mouth 2 (two) times daily.     . metoprolol (LOPRESSOR) 100 MG tablet Take 1 tablet (100 mg total) by mouth 2 (two) times daily. 180 tablet 0  . Multiple Vitamins-Minerals (VISION-VITE PRESERVE PO) Take 1 capsule by mouth 2 (two) times daily.    . potassium chloride SA (K-DUR,KLOR-CON) 20 MEQ tablet Take 1 tablet (20 mEq total) by mouth 2 (two) times daily. 60 tablet 12  . psyllium (METAMUCIL) 58.6 % powder Take 1 packet by mouth daily.    . simvastatin (ZOCOR) 10 MG tablet Take 1 tablet (10 mg total) by mouth daily. 30 tablet 12  . warfarin (COUMADIN) 5 MG tablet Take 1 tablet (5 mg total) by mouth daily at 6 PM. (Patient taking differently: Take 2.5 mg by mouth daily at 6 PM. ) 30 tablet 3   No current facility-administered medications for this visit.     Allergies:   Amiodarone; Aspirin; Codeine; Tramadol; Lipitor [atorvastatin calcium]; and Pravachol    Social History:  The patient  reports that she quit smoking about 55 years ago. Her smoking use included Cigarettes. She has a 10.00 pack-year smoking history. She has never used smokeless tobacco. She reports that she does not drink alcohol or use drugs.   Family History:  The patient's family history includes Breast cancer in her sister; Coronary artery disease in her father; Emphysema in her father; Heart disease in her father and sister; Heart failure in her father and sister; Stroke in her sister.    ROS:  Please see the history of present illness. All other systems are reviewed and negative.    PHYSICAL EXAM: VS:  BP 128/60   Pulse 65   Ht 5\' 6"  (1.676 m)   Wt 143 lb 6.4 oz (65 kg)   SpO2 99%   BMI 23.15 kg/m  , BMI Body mass index is 23.15 kg/m. GEN: Well  nourished, well developed, female in no acute distress  HEENT: normal for age  Neck: no JVD, no carotid bruit, no masses Cardiac: RRR; 2/6 murmur, no rubs, or gallops Respiratory:  clear to auscultation bilaterally, normal work of breathing GI: soft, nontender, nondistended, + BS MS: no deformity or atrophy; trace left lower extremity edema; distal pulses are 2+ in all 4 extremities; varicose veins are much more noticeable in her left leg, that one is more warm and discolored than her right leg. Patient states this is normal Skin: warm and dry, no rash Neuro:  Strength and sensation are intact Psych: euthymic mood, full affect   EKG:  EKG is not ordered today.  Recent Labs: 06/01/2016: TSH 3.25  06/29/2016: ALT 19 07/05/2016: Magnesium 1.9 07/15/2016: BUN 13; Creatinine, Ser 0.72; Hemoglobin 11.1; Platelets 254; Potassium 3.9; Sodium 137    Lipid Panel No results found for: CHOL, TRIG, HDL, CHOLHDL, VLDL, LDLCALC, LDLDIRECT   Wt Readings from Last 3 Encounters:  07/17/16 143 lb 6.4 oz (65 kg)  07/14/16 143 lb 11.2 oz (65.2 kg)  07/07/16 147 lb 11.2 oz (67 kg)     Other studies Reviewed: Additional studies/ records that were reviewed today include: Hospital records and testing.  ASSESSMENT AND PLAN:  1.  Near syncope: She is clear that she did not completely lose consciousness. Most of her dizziness seems associated with position change in turning or bending over. Her symptoms are not clearly orthostatic in nature. She has an appointment with her primary care physician, and will follow-up with him on this.  2. Driving: I advised that I was not comfortable with her driving right now because she is still a bit weak from the surgery, and still having significant dizziness. From a cardiac standpoint, I don't have a reason for her not to drive but I don't feel that she is ready from a medical standpoint. She will follow-up with Dr. Su Hilt on this. She will see him later this week, after  she has been evaluated by physical therapy.  3. Atrial fibrillation: During her last hospitalization, her digoxin level was 1.5 and her dig dose was reduced from 0.25 mg daily down to 0.125 mg daily. Her heart rate is normal and she is not having any palpitations.  4. Chronic anticoagulation: She is compliant with Coumadin. Her injuries after her recent fall are slowly resolving. Management is per the pharmacy here. CHA2DS2VASc=6 (age x 2, female, HTN, CHF, DM).   Current medicines are reviewed at length with the patient today.  The patient does not have concerns regarding medicines.  The following changes have been made:  no change  Labs/ tests ordered today include:  No orders of the defined types were placed in this encounter.    Disposition:   FU with Dr. Jens Som, Dr. Excell Seltzer, and Dr. Su Hilt.  Tawny Asal  07/17/2016 9:03 AM    [facility] Phone: [phone]; Fax: [phone]  This note was written with the assistance of speech recognition software. Please excuse any transcriptional errors.

## 2016-08-03 ENCOUNTER — Ambulatory Visit (INDEPENDENT_AMBULATORY_CARE_PROVIDER_SITE_OTHER): Payer: Medicare Other | Admitting: *Deleted

## 2016-08-03 ENCOUNTER — Other Ambulatory Visit: Payer: Self-pay

## 2016-08-03 ENCOUNTER — Encounter: Payer: Self-pay | Admitting: Cardiovascular Disease

## 2016-08-03 ENCOUNTER — Ambulatory Visit (HOSPITAL_COMMUNITY): Payer: Medicare Other | Attending: Cardiology

## 2016-08-03 ENCOUNTER — Ambulatory Visit (INDEPENDENT_AMBULATORY_CARE_PROVIDER_SITE_OTHER): Payer: Medicare Other | Admitting: Cardiovascular Disease

## 2016-08-03 VITALS — BP 132/68 | HR 88 | Ht 66.0 in | Wt 146.6 lb

## 2016-08-03 DIAGNOSIS — I272 Pulmonary hypertension, unspecified: Secondary | ICD-10-CM | POA: Diagnosis not present

## 2016-08-03 DIAGNOSIS — Z952 Presence of prosthetic heart valve: Secondary | ICD-10-CM

## 2016-08-03 DIAGNOSIS — I350 Nonrheumatic aortic (valve) stenosis: Secondary | ICD-10-CM

## 2016-08-03 DIAGNOSIS — I4891 Unspecified atrial fibrillation: Secondary | ICD-10-CM | POA: Insufficient documentation

## 2016-08-03 DIAGNOSIS — I371 Nonrheumatic pulmonary valve insufficiency: Secondary | ICD-10-CM | POA: Insufficient documentation

## 2016-08-03 DIAGNOSIS — Z5181 Encounter for therapeutic drug level monitoring: Secondary | ICD-10-CM

## 2016-08-03 DIAGNOSIS — I6523 Occlusion and stenosis of bilateral carotid arteries: Secondary | ICD-10-CM

## 2016-08-03 DIAGNOSIS — I482 Chronic atrial fibrillation, unspecified: Secondary | ICD-10-CM

## 2016-08-03 DIAGNOSIS — I340 Nonrheumatic mitral (valve) insufficiency: Secondary | ICD-10-CM | POA: Insufficient documentation

## 2016-08-03 LAB — POCT INR: INR: 3.8

## 2016-08-03 NOTE — Patient Instructions (Signed)
Medication Instructions:  Your physician recommends that you continue on your current medications as directed. Please refer to the Current Medication list given to you today.  Labwork: No new orders.   Testing/Procedures: Your physician has requested that you have an echocardiogram in 1 YEAR. Echocardiography is a painless test that uses sound waves to create images of your heart. It provides your doctor with information about the size and shape of your heart and how well your heart's chambers and valves are working. This procedure takes approximately one hour. There are no restrictions for this procedure.  Follow-Up: Your physician recommends that you schedule a follow-up appointment in: 3-4 MONTHS with Dr Jens Somrenshaw  Your physician wants you to follow-up in: 1 YEAR with Dr Excell Seltzerooper.  You will receive a reminder letter in the mail two months in advance. If you don't receive a letter, please call our office to schedule the follow-up appointment.   Any Other Special Instructions Will Be Listed Below (If Applicable).     If you need a refill on your cardiac medications before your next appointment, please call your pharmacy.

## 2016-08-03 NOTE — Progress Notes (Signed)
Cardiology Office Note Date:  08/03/2016   ID:  Saul FordyceMillie H Revuelta, DOB [birth year], MRN [id number]  PCP:  Lorenda PeckOBERTS, RONALD WAYNE, MD  Cardiologist:  Tonny Bollmanooper, Alysah Carton, MD    Chief Complaint  Patient presents with  . Aortic Stenosis  . Atrial Fibrillation     History of Present Illness: [name] is a 82 y.o. female who presents for 30-day TAVR follow-up.   She underwent TAVR with a 23 mm Edward Sapien 3 valve 07/04/2016 via a percutaneous transfemoral approach.  The patient initially did well without any immediate postoperative complications. She developed dizziness and was hospitalized for overnight observation 07/14/2016. Medications were adjusted and she has done well since that time. She reports no recurrent dizziness. She has no shortness of breath and actually is feeling quite well. She denies chest pain or pressure. No palpitations. She has chronic leg swelling which is unchanged. Denies orthopnea or PND.   Past Medical History:  Diagnosis Date  . Arthritis   . Chronic diastolic congestive heart failure (HCC)   . Diabetes mellitus   . History of peptic ulcer disease   . Hypercholesteremia   . Hyperlipidemia   . Hypertension   . Hypothyroidism   . Peripheral neuropathy (HCC)   . Permanent atrial fibrillation (HCC)   . Presence of permanent cardiac pacemaker   . 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 Severe aortic stenosis   . Tachycardia-bradycardia syndrome (HCC)    a. s/p SJM single chamber PPM implanted by Dr Reyes IvanKersey 2009  . Thrombocytopenia (HCC)   . Thyroid disease     Past Surgical History:  Procedure Laterality Date  . ABDOMINAL HYSTERECTOMY    . APPENDECTOMY    . BREAST BIOPSY  02/22/2007    left  . CARDIAC CATHETERIZATION N/A 06/14/2016   Procedure: Left Heart Cath and Coronary Angiography;  Surgeon: Tonny BollmanMichael Beanca Kiester, MD;   [facility];  Service: Cardiovascular;  Laterality: N/A;  . CARDIOVERSION  05/16/2005   Electrical cardioversion   . ESOPHAGOGASTRODUODENOSCOPY  03/12/2007    Esophagogastroduodenoscopy with control of bleeding  . PACEMAKER INSERTION  06/18/2008   SJM Zephyr XL SR implanted by Dr Reyes IvanKersey  . SHOULDER SURGERY  12/07/2004  . TEE WITHOUT CARDIOVERSION N/A 07/04/2016   Procedure: TRANSESOPHAGEAL ECHOCARDIOGRAM (TEE);  Surgeon: Tonny BollmanMichael Chevonne Bostrom, MD;  [facility];  Service: Open Heart Surgery;  Laterality: N/A;  . TRANSCATHETER AORTIC VALVE REPLACEMENT, TRANSFEMORAL N/A 07/04/2016   Procedure: TRANSCATHETER AORTIC VALVE REPLACEMENT, TRANSFEMORAL;  Surgeon: Tonny BollmanMichael Tevita Gomer, MD;  [facility];  Service: Open Heart Surgery;  Laterality: N/A;    Current Outpatient Prescriptions  Medication Sig Dispense Refill  . amoxicillin (AMOXIL) 500 MG capsule Take 500 mg by mouth daily as needed (for dental appointments).     . calcium carbonate (OS-CAL) 600 MG TABS Take 1,200 mg by mouth 3 (three) times a week.     . digoxin (LANOXIN) 0.125 MG tablet Take 1 tablet (0.125 mg total) by mouth daily. 30 tablet 6  . diltiazem (CARDIZEM CD) 240 MG 24 hr capsule Take 1 capsule (240 mg total) by mouth daily. 30 capsule 6  . furosemide (LASIX) 40 MG tablet Begin taking 40mg  daily 135 tablet 3  . gabapentin (NEURONTIN) 300 MG capsule Take 300-900 mg by mouth See admin instructions. Take 300 mg by mouth in the morning and take 900 mg by mouth in  the evening.    Marland Kitchen levothyroxine (SYNTHROID, LEVOTHROID) 50 MCG tablet Take 50 mcg by mouth daily.      Marland Kitchen lisinopril (PRINIVIL,ZESTRIL) 2.5 MG tablet Take 2.5 mg by mouth daily.    . metFORMIN (GLUCOPHAGE) 500 MG tablet Take 1,000 mg by mouth 2 (two) times daily.     . metoprolol (LOPRESSOR) 100 MG tablet Take 1 tablet (100 mg total) by mouth 2 (two) times daily. 180 tablet 0  . Multiple Vitamins-Minerals (VISION-VITE PRESERVE PO) Take 1 capsule by mouth 2 (two)  times daily.    . potassium chloride SA (K-DUR,KLOR-CON) 20 MEQ tablet Take 1 tablet (20 mEq total) by mouth 2 (two) times daily. 60 tablet 12  . psyllium (METAMUCIL) 58.6 % powder Take 1 packet by mouth daily.    . simvastatin (ZOCOR) 10 MG tablet Take 1 tablet (10 mg total) by mouth daily. 30 tablet 12  . warfarin (COUMADIN) 5 MG tablet Take 1 tablet (5 mg total) by mouth daily at 6 PM. (Patient taking differently: Take 2.5 mg by mouth daily at 6 PM. ) 30 tablet 3   No current facility-administered medications for this visit.     Allergies:   Amiodarone; Aspirin; Codeine; Tramadol; Lipitor [atorvastatin calcium]; and Pravachol   Social History:  The patient  reports that she quit smoking about 55 years ago. Her smoking use included Cigarettes. She has a 10.00 pack-year smoking history. She has never used smokeless tobacco. She reports that she does not drink alcohol or use drugs.   Family History:  The patient's  family history includes Breast cancer in her sister; Coronary artery disease in her father; Emphysema in her father; Heart disease in her father and sister; Heart failure in her father and sister; Stroke in her sister.    ROS:  Please see the history of present illness.  All other systems are reviewed and negative.    PHYSICAL EXAM: VS:  BP 132/68   Pulse 88   Ht 5\' 6"  (1.676 m)   Wt 66.5 kg (146 lb 9.6 oz)   BMI 23.66 kg/m  , BMI Body mass index is 23.66 kg/m. GEN: Well nourished, well developed, in no acute distress  HEENT: normal  Neck: no JVD, no masses. No carotid bruits Cardiac: irregularly irregular with a soft 1/6 ejection murmur at the RUSB, no diastolic murmur             Respiratory:  clear to auscultation bilaterally, normal work of breathing GI: soft, nontender, nondistended, + BS MS: no deformity or atrophy  Ext: 1+ bilateral pretibial edema L > R, pedal pulses 2+= bilaterally Skin: warm and dry, no rash Neuro:  Strength and sensation are intact Psych:  euthymic mood, full affect  EKG:  EKG is ordered today. The ekg ordered today shows Atrial fibrillation with demand ventricular pacing, nonspecific IVCD, HR 77 bpm  Recent Labs: 06/01/2016: TSH 3.25 06/29/2016: ALT 19 07/05/2016: Magnesium 1.9 07/15/2016: BUN 13; Creatinine, Ser 0.72; Hemoglobin 11.1; Platelets 254; Potassium 3.9; Sodium 137   Lipid Panel  No results found for: CHOL, TRIG, HDL, CHOLHDL, VLDL, LDLCALC, LDLDIRECT    Wt Readings from Last 3 Encounters:  08/03/16 66.5 kg (146 lb 9.6 oz)  07/17/16 65 kg (143 lb 6.4 oz)  07/14/16 65.2 kg (143 lb 11.2 oz)     Cardiac Studies Reviewed: 2-D echocardiogram performed just before the visit is personally reviewed. Formal interpretation is pending. The patient appears to have normal LV systolic function. Peak and mean  transaortic valve gradients are 20 and 11 mmHg respectively. There is mild perivalvular regurgitation.  ASSESSMENT AND PLAN: 1.  Severe aortic stenosis now one month out from TAVR with NYHA functional class I symptoms. The patient has had an excellent recovery. Her symptoms of dizziness have resolved after medication adjustment. She can resume driving. She understands the need for SBE prophylaxis. No medication changes are made. She continues on warfarin in the setting of chronic atrial fibrillation. Her echo images are reviewed and are transcatheter aortic valve function appears normal with mild paravalvular regurgitation. She will have a one-year follow-up echo per protocol.  2. Chronic atrial fibrillation: Anticoagulated with warfarin. Heart rate is well controlled on a combination of low-dose digoxin, metoprolol, and diltiazem.  Current medicines are reviewed with the patient today.  The patient does not have concerns regarding medicines.  Labs/ tests ordered today include:  No orders of the defined types were placed in this encounter.   Disposition:   FU Dr Jens Somrenshaw 3-4 months, Valve Clinic appt one year with an  echo per protocol.  Enzo BiSigned, Hermenia Fritcher, MD  08/03/2016 4:29 PM    [facility] [address], [place], [place]  [address] Phone: [phone]; Fax: [phone]

## 2016-08-14 ENCOUNTER — Other Ambulatory Visit: Payer: Self-pay | Admitting: Cardiology

## 2016-08-16 ENCOUNTER — Ambulatory Visit (INDEPENDENT_AMBULATORY_CARE_PROVIDER_SITE_OTHER): Payer: Medicare Other | Admitting: Pharmacist

## 2016-08-16 DIAGNOSIS — I482 Chronic atrial fibrillation, unspecified: Secondary | ICD-10-CM

## 2016-08-16 DIAGNOSIS — I6523 Occlusion and stenosis of bilateral carotid arteries: Secondary | ICD-10-CM

## 2016-08-16 DIAGNOSIS — Z5181 Encounter for therapeutic drug level monitoring: Secondary | ICD-10-CM | POA: Diagnosis not present

## 2016-08-16 LAB — POCT INR: INR: 3.7

## 2016-08-17 ENCOUNTER — Telehealth: Payer: Self-pay | Admitting: Cardiology

## 2016-08-17 MED ORDER — WARFARIN SODIUM 2.5 MG PO TABS: ORAL_TABLET | ORAL | 3 refills | Status: AC

## 2016-08-17 NOTE — Telephone Encounter (Signed)
RX sent to pharmacy of choice. LM to make pt aware.

## 2016-08-17 NOTE — Telephone Encounter (Signed)
New message  Pt is calling  Needs RX Warfain 2.5mg   Please call in [facility]Pharmacy

## 2016-08-20 ENCOUNTER — Encounter (HOSPITAL_COMMUNITY): Payer: Self-pay

## 2016-08-20 ENCOUNTER — Inpatient Hospital Stay (HOSPITAL_COMMUNITY)
Admission: EM | Admit: 2016-08-20 | Discharge: 2016-09-01 | DRG: 291 | Disposition: E | Payer: Medicare Other | Attending: Internal Medicine | Admitting: Internal Medicine

## 2016-08-20 DIAGNOSIS — E038 Other specified hypothyroidism: Secondary | ICD-10-CM | POA: Diagnosis present

## 2016-08-20 DIAGNOSIS — T45515A Adverse effect of anticoagulants, initial encounter: Secondary | ICD-10-CM | POA: Diagnosis present

## 2016-08-20 DIAGNOSIS — J90 Pleural effusion, not elsewhere classified: Secondary | ICD-10-CM

## 2016-08-20 DIAGNOSIS — I110 Hypertensive heart disease with heart failure: Secondary | ICD-10-CM | POA: Diagnosis not present

## 2016-08-20 DIAGNOSIS — J9601 Acute respiratory failure with hypoxia: Secondary | ICD-10-CM | POA: Diagnosis present

## 2016-08-20 DIAGNOSIS — D72829 Elevated white blood cell count, unspecified: Secondary | ICD-10-CM | POA: Diagnosis present

## 2016-08-20 DIAGNOSIS — I4891 Unspecified atrial fibrillation: Secondary | ICD-10-CM | POA: Diagnosis present

## 2016-08-20 DIAGNOSIS — G931 Anoxic brain damage, not elsewhere classified: Secondary | ICD-10-CM | POA: Diagnosis not present

## 2016-08-20 DIAGNOSIS — Z79899 Other long term (current) drug therapy: Secondary | ICD-10-CM

## 2016-08-20 DIAGNOSIS — E785 Hyperlipidemia, unspecified: Secondary | ICD-10-CM | POA: Diagnosis present

## 2016-08-20 DIAGNOSIS — IMO0002 Reserved for concepts with insufficient information to code with codable children: Secondary | ICD-10-CM | POA: Diagnosis present

## 2016-08-20 DIAGNOSIS — Z952 Presence of prosthetic heart valve: Secondary | ICD-10-CM

## 2016-08-20 DIAGNOSIS — Z7901 Long term (current) use of anticoagulants: Secondary | ICD-10-CM

## 2016-08-20 DIAGNOSIS — I509 Heart failure, unspecified: Secondary | ICD-10-CM

## 2016-08-20 DIAGNOSIS — I429 Cardiomyopathy, unspecified: Secondary | ICD-10-CM | POA: Diagnosis present

## 2016-08-20 DIAGNOSIS — S270XXA Traumatic pneumothorax, initial encounter: Secondary | ICD-10-CM | POA: Diagnosis not present

## 2016-08-20 DIAGNOSIS — I5032 Chronic diastolic (congestive) heart failure: Secondary | ICD-10-CM

## 2016-08-20 DIAGNOSIS — Z953 Presence of xenogenic heart valve: Secondary | ICD-10-CM

## 2016-08-20 DIAGNOSIS — R0602 Shortness of breath: Secondary | ICD-10-CM | POA: Diagnosis not present

## 2016-08-20 DIAGNOSIS — I469 Cardiac arrest, cause unspecified: Secondary | ICD-10-CM | POA: Diagnosis not present

## 2016-08-20 DIAGNOSIS — Z7984 Long term (current) use of oral hypoglycemic drugs: Secondary | ICD-10-CM

## 2016-08-20 DIAGNOSIS — E118 Type 2 diabetes mellitus with unspecified complications: Secondary | ICD-10-CM

## 2016-08-20 DIAGNOSIS — N179 Acute kidney failure, unspecified: Secondary | ICD-10-CM | POA: Diagnosis not present

## 2016-08-20 DIAGNOSIS — R570 Cardiogenic shock: Secondary | ICD-10-CM | POA: Diagnosis not present

## 2016-08-20 DIAGNOSIS — Z515 Encounter for palliative care: Secondary | ICD-10-CM | POA: Diagnosis not present

## 2016-08-20 DIAGNOSIS — I482 Chronic atrial fibrillation: Secondary | ICD-10-CM | POA: Diagnosis present

## 2016-08-20 DIAGNOSIS — E876 Hypokalemia: Secondary | ICD-10-CM | POA: Diagnosis not present

## 2016-08-20 DIAGNOSIS — Z87891 Personal history of nicotine dependence: Secondary | ICD-10-CM

## 2016-08-20 DIAGNOSIS — E1165 Type 2 diabetes mellitus with hyperglycemia: Secondary | ICD-10-CM | POA: Diagnosis present

## 2016-08-20 DIAGNOSIS — R791 Abnormal coagulation profile: Secondary | ICD-10-CM | POA: Diagnosis present

## 2016-08-20 DIAGNOSIS — Z950 Presence of cardiac pacemaker: Secondary | ICD-10-CM

## 2016-08-20 DIAGNOSIS — E872 Acidosis: Secondary | ICD-10-CM | POA: Diagnosis not present

## 2016-08-20 DIAGNOSIS — I4901 Ventricular fibrillation: Secondary | ICD-10-CM | POA: Diagnosis not present

## 2016-08-20 DIAGNOSIS — E1142 Type 2 diabetes mellitus with diabetic polyneuropathy: Secondary | ICD-10-CM | POA: Diagnosis present

## 2016-08-20 DIAGNOSIS — I5043 Acute on chronic combined systolic (congestive) and diastolic (congestive) heart failure: Secondary | ICD-10-CM | POA: Diagnosis present

## 2016-08-20 DIAGNOSIS — D649 Anemia, unspecified: Secondary | ICD-10-CM | POA: Diagnosis present

## 2016-08-20 DIAGNOSIS — I5031 Acute diastolic (congestive) heart failure: Secondary | ICD-10-CM | POA: Diagnosis present

## 2016-08-20 DIAGNOSIS — E875 Hyperkalemia: Secondary | ICD-10-CM | POA: Diagnosis not present

## 2016-08-20 MED ORDER — DILTIAZEM HCL 100 MG IV SOLR
5.0000 mg/h | INTRAVENOUS | Status: DC
  Administered 2016-08-21: 5 mg/h via INTRAVENOUS
  Administered 2016-08-21: 15 mg/h via INTRAVENOUS
  Filled 2016-08-20 (×3): qty 100

## 2016-08-20 MED ORDER — SODIUM CHLORIDE 0.9 % IV BOLUS (SEPSIS)
500.0000 mL | Freq: Once | INTRAVENOUS | Status: AC
  Administered 2016-08-21: 500 mL via INTRAVENOUS

## 2016-08-20 MED ORDER — DILTIAZEM LOAD VIA INFUSION
20.0000 mg | Freq: Once | INTRAVENOUS | Status: AC
  Administered 2016-08-21: 20 mg via INTRAVENOUS
  Filled 2016-08-20: qty 20

## 2016-08-20 NOTE — ED Provider Notes (Signed)
[facility]-EMERGENCY DEPT Provider Note   CSN: [id number] Arrival date & time: 2016/09/07  2333   By signing my name below, I, [name], attest that this documentation has been prepared under the direction and in the presence of Derwood Kaplan, MD . Electronically Signed: Nelwyn Benitez, Scribe. 09-07-2016. 11:47 PM.   History   Chief Complaint Chief Complaint  Patient presents with  . Atrial Fibrillation  . Shortness of Breath   The history is provided by the patient. No language interpreter was used.    HPI Comments:  [name] is a 82 y.o. female with pmhx of afib, DM, HTN and HLD brought in by EMS who presents to the Emergency Department complaining of sudden-onset constant shortness of breath and chest pain beginning 4 hours ago. Pt states that she was sitting watching TV when she began coughing and became short of breath. She reports associated central chest pain, exacerbated by inspiration, palpitations, rhinorrhea, congestion and nausea. Pt denies any bloody phlegm, fevers, chills, abdominal pain or vomiting. She also denies and hx of MI, CA, or Stroke. She also has a recent PSHx of aortic valve replacement, taking place 16 days ago.   Past Medical History:  Diagnosis Date  . Arthritis   . Chronic diastolic congestive heart failure (HCC)   . Diabetes mellitus   . History of peptic ulcer disease   . Hypercholesteremia   . Hyperlipidemia   . Hypertension   . Hypothyroidism   . Peripheral neuropathy (HCC)   . Permanent atrial fibrillation (HCC)   . Presence of permanent cardiac pacemaker   . 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 Severe aortic stenosis   . Tachycardia-bradycardia syndrome (HCC)    a. s/p SJM single chamber PPM implanted by Dr Reyes Ivan 2009  . Thrombocytopenia (HCC)   . Thyroid disease     Patient Active Problem List   Diagnosis Date Noted  . Atrial fibrillation with RVR (HCC) 08/21/2016  . Near syncope 07/15/2016  . Fall   . Syncope 07/14/2016  . Current use of long term anticoagulation   . 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 Chronic diastolic congestive heart failure (HCC)   . Cerebrovascular disease 12/21/2015  . St. Jude pacer 12/21/2015  . Osteoarthritis 11/24/2013  . Left carotid bruit 06/11/2013  . Type II or unspecified type diabetes mellitus without mention of complication, uncontrolled 03/08/2011  . Hypertensive heart disease   . Hyperlipidemia 10/28/2010  . Chronic atrial fibrillation (HCC)   . Tachycardia-bradycardia syndrome [facility])     Past Surgical History:  Procedure Laterality Date  . ABDOMINAL HYSTERECTOMY    . APPENDECTOMY    . BREAST BIOPSY  02/22/2007    left  . CARDIAC CATHETERIZATION N/A 06/14/2016   Procedure: Left Heart Cath and Coronary Angiography;  Surgeon: Tonny Bollman, MD;  Location[facility] CV LAB;  Service: Cardiovascular;  Laterality: N/A;  . CARDIOVERSION  05/16/2005   Electrical cardioversion   . ESOPHAGOGASTRODUODENOSCOPY  03/12/2007    Esophagogastroduodenoscopy with control of bleeding  . PACEMAKER INSERTION  06/18/2008   SJM Zephyr XL SR implanted by Dr Reyes Ivan  . SHOULDER SURGERY  12/07/2004  . TEE WITHOUT CARDIOVERSION N/A 07/04/2016   Procedure: TRANSESOPHAGEAL ECHOCARDIOGRAM (TEE);  Surgeon: Tonny Bollman, MD;  Location: [facility] OR;  Service: Open Heart Surgery;  Laterality: N/A;  . TRANSCATHETER AORTIC VALVE REPLACEMENT, TRANSFEMORAL N/A 07/04/2016  Procedure: TRANSCATHETER AORTIC VALVE REPLACEMENT, TRANSFEMORAL;  Surgeon: Tonny BollmanMichael Cooper, MD;  [facility];  Service: Open Heart Surgery;  Laterality: N/A;    OB History    No data available       Home Medications    Prior to Admission medications   Medication Sig Start Date End Date Taking? Authorizing Provider  amoxicillin (AMOXIL) 500 MG capsule Take 500 mg by  mouth daily as needed (for dental appointments).  09/13/11  Yes Historical Provider, MD  calcium carbonate (OS-CAL) 600 MG TABS Take 1,200 mg by mouth 3 (three) times a week.    Yes Historical Provider, MD  digoxin (LANOXIN) 0.125 MG tablet Take 1 tablet (0.125 mg total) by mouth daily. 07/16/16  Yes Arty BaumgartnerLindsay B Roberts, NP  diltiazem (CARDIZEM CD) 240 MG 24 hr capsule Take 1 capsule (240 mg total) by mouth daily. 07/16/16  Yes Arty BaumgartnerLindsay B Roberts, NP  furosemide (LASIX) 40 MG tablet Begin taking 40mg  daily Patient taking differently: Take 40 mg by mouth daily.  07/15/16  Yes Arty BaumgartnerLindsay B Roberts, NP  gabapentin (NEURONTIN) 300 MG capsule Take 300-900 mg by mouth See admin instructions. Take 300 mg by mouth in the morning and take 900 mg by mouth in the evening. 01/23/11  Yes Historical Provider, MD  levothyroxine (SYNTHROID, LEVOTHROID) 50 MCG tablet Take 50 mcg by mouth daily.     Yes Historical Provider, MD  lisinopril (PRINIVIL,ZESTRIL) 2.5 MG tablet Take 2.5 mg by mouth daily. 08/14/12  Yes Historical Provider, MD  metFORMIN (GLUCOPHAGE) 500 MG tablet Take 1,000 mg by mouth 2 (two) times daily.  11/25/14  Yes Historical Provider, MD  metoprolol (LOPRESSOR) 100 MG tablet TAKE ONE TABLET BY MOUTH TWICE DAILY 08/14/16  Yes Tonny BollmanMichael Cooper, MD  Multiple Vitamins-Minerals (VISION-VITE PRESERVE PO) Take 1 capsule by mouth 2 (two) times daily.   Yes Historical Provider, MD  potassium chloride SA (K-DUR,KLOR-CON) 20 MEQ tablet Take 1 tablet (20 mEq total) by mouth 2 (two) times daily. 07/07/16  Yes Little IshikawaErin E Smith, NP  psyllium (METAMUCIL) 58.6 % powder Take 1 packet by mouth daily.   Yes Historical Provider, MD  simvastatin (ZOCOR) 10 MG tablet Take 1 tablet (10 mg total) by mouth daily. 07/08/16  Yes Little IshikawaErin E Smith, NP  warfarin (COUMADIN) 2.5 MG tablet Take 0.5 to 1 tablet daily as directed by Coumadin clinic. Patient taking differently: Take 1.25-2.5 mg by mouth See admin instructions. Take 1/2 tablet on Wednesday  then take 1 tablet all there other days 08/17/16   Lewayne BuntingBrian S Crenshaw, MD    Family History Family History  Problem Relation Age of Onset  . Heart disease Father   . Emphysema Father   . Heart failure Father   . Coronary artery disease Father   . Stroke Sister     cerebral hemorrhage  . Breast cancer Sister   . Heart disease Sister   . Heart failure Sister   . Heart attack Neg Hx     Social History Social History  Substance Use Topics  . Smoking status: Former Smoker    Packs/day: 1.00    Years: 10.00    Types: Cigarettes    Quit date: 03/06/1961  . Smokeless tobacco: Never Used  . Alcohol use No     Allergies   Amiodarone; Aspirin; Codeine; Tramadol; Lipitor [atorvastatin calcium]; and Pravachol   Review of Systems Review of Systems 10 Systems reviewed and are negative for acute change except as noted in the HPI.  Physical Exam  Updated Vital Signs BP 139/76   Pulse 106   Temp 98.4 F (36.9 C) (Oral)   Resp 19   SpO2 94%   Physical Exam  Constitutional: She is oriented to person, place, and time. She appears well-developed and well-nourished. No distress.  HENT:  Head: Normocephalic and atraumatic.  Eyes: EOM are normal.  Neck: Normal range of motion.  Cardiovascular: Normal heart sounds.  An irregularly irregular rhythm present. Tachycardia present.   Unilateral leg swelling on left side (baseline). No pitting edema.   Pulmonary/Chest: Effort normal.  Crackles at base of lungs. Lungs otherwise clear  Abdominal: Soft. She exhibits no distension and no mass. There is no tenderness.  Musculoskeletal: Normal range of motion.  No reproducible substernal pain.  Neurological: She is alert and oriented to person, place, and time.  Skin: Skin is warm and dry.  Psychiatric: She has a normal mood and affect. Judgment normal.  Nursing note and vitals reviewed.    ED Treatments / Results  DIAGNOSTIC STUDIES:  Oxygen Saturation is 100% on RA, normal by my  interpretation.    COORDINATION OF CARE:  2:51 AM Discussed treatment plan with pt at bedside and pt agreed to plan.  Labs (all labs ordered are listed, but only abnormal results are displayed) Labs Reviewed  BASIC METABOLIC PANEL - Abnormal; Notable for the following:       Result Value   Glucose, Bld 201 (*)    BUN 22 (*)    GFR calc non Af Amer 59 (*)    All other components within normal limits  APTT - Abnormal; Notable for the following:    aPTT 55 (*)    All other components within normal limits  PROTIME-INR - Abnormal; Notable for the following:    Prothrombin Time 41.3 (*)    INR 4.31 (*)    All other components within normal limits  BRAIN NATRIURETIC PEPTIDE - Abnormal; Notable for the following:    B Natriuretic Peptide 273.3 (*)    All other components within normal limits  CBC WITH DIFFERENTIAL/PLATELET - Abnormal; Notable for the following:    WBC 16.5 (*)    Hemoglobin 11.8 (*)    Neutro Abs 14.2 (*)    All other components within normal limits  URINALYSIS, ROUTINE W REFLEX MICROSCOPIC (NOT AT [facility]) - Abnormal; Notable for the following:    Glucose, UA 100 (*)    Ketones, ur 15 (*)    Leukocytes, UA SMALL (*)    All other components within normal limits  DIGOXIN LEVEL - Abnormal; Notable for the following:    Digoxin Level 0.7 (*)    All other components within normal limits  URINE MICROSCOPIC-ADD ON - Abnormal; Notable for the following:    Squamous Epithelial / LPF 6-30 (*)    Bacteria, UA FEW (*)    All other components within normal limits  TROPONIN I  MAGNESIUM    EKG  EKG Interpretation  Date/Time:  Sunday August 20 2016 23:49:15 EST Ventricular Rate:  143 PR Interval:    QRS Duration: 145 QT Interval:  318 QTC Calculation: 491 R Axis:   -69 Text Interpretation:  Atrial fibrillation afib with RVR Nonspecific ST and T wave abnormality No acute changes Confirmed by Rhunette Croft, MD, Janey Genta [phone]) on 08/21/2016 12:20:38 AM        Radiology Dg Chest 2 View  Result Date: 08/21/2016 CLINICAL DATA:  Shortness of breath starting at 8 p.m. and gradually worsening. History of atrial fibrillation and  hypertension. EXAM: CHEST  2 VIEW COMPARISON:  07/14/2016 FINDINGS: Postoperative changes in the mediastinum. Cardiac pacemaker. Mild cardiac enlargement. Increasing interstitial pattern to the lungs mostly in the periphery and lung bases, likely representing developing edema. Small bilateral pleural effusions. No focal consolidation. No pneumothorax. Calcified and tortuous aorta. Degenerative changes in the spine and shoulders. IMPRESSION: Cardiac enlargement with increasing interstitial pattern to the lungs suggesting developing interstitial edema. Small bilateral pleural effusions. Electronically Signed   By: Burman NievesWilliam  Stevens M.D.   On: 08/21/2016 01:18    Procedures .Critical Care Performed by: Derwood KaplanNANAVATI, Dannell Gortney Authorized by: Derwood KaplanNANAVATI, Brendaliz Kuk     (including critical care time)  Medications Ordered in ED Medications  diltiazem (CARDIZEM) 1 mg/mL load via infusion 20 mg (20 mg Intravenous Bolus from Bag 08/21/16 0021)    And  diltiazem (CARDIZEM) 100 mg in dextrose 5 % 100 mL (1 mg/mL) infusion (10 mg/hr Intravenous Rate/Dose Change 08/21/16 0137)  sodium chloride 0.9 % bolus 500 mL (500 mLs Intravenous New Bag/Given 08/21/16 0015)     Initial Impression / Assessment and Plan / ED Course  I have reviewed the triage vital signs and the nursing notes.  Pertinent labs & imaging results that were available during my care of the patient were reviewed by me and considered in my medical decision making (see chart for details).  Clinical Course as of Aug 21 250  Mon Aug 21, 2016  [id number] Dr. Mayford Knifeurner, Cardiology recommends medicine admission for now, and she can be seen by Cards in consultation. Pt on reassessment has her HR in the 100-120s. She is comfortable. Dilt at 410mh/hr.   [AN]    Clinical Course User Index [AN]  Derwood KaplanAnkit Jakyron Fabro, MD   I personally performed the services described in this documentation, which was scribed in my presence. The recorded information has been reviewed and is accurate.   Pt comes in with cc of dib. She is in afib with RVR. She has hx of permanent afib and has been cardioverted in the past - but she returned to afib. Also has valvular afib, so not the best candidate for ER cardioversion. We will start rate control. Pt has no PE risk factors, no recent infections, no dehydrations, no chest pain/ACS - so we are not sure why she is in RVR.   CHADVASC IS 6  CHA2DS2/VAS Stroke Risk Points      6 >= 2 Points: High Risk  1 - 1.99 Points: Medium Risk  0 Points: Low Risk    The previous score was 5 on 05/30/2016.:  Change:         Details    Note: External data might be a factor in metrics not marked with    Points Metrics   This score determines the patient's risk of having a stroke if the  patient has atrial fibrillation.       1 Has Congestive Heart Failure:  Yes   0 Has Vascular Disease:  No   1 Has Hypertension:  Yes   2 Age:  [age]   1 Has Diabetes:  Yes   0 Had Stroke:  No Had TIA:  No Had thromboembolism:  No   1 Female:  Yes           Final Clinical Impressions(s) / ED Diagnoses   Final diagnoses:  Atrial fibrillation with RVR (HCC)    New Prescriptions New Prescriptions   No medications on file     Derwood KaplanAnkit Ellee Wawrzyniak, MD 08/21/16 [phone]

## 2016-08-20 NOTE — ED Triage Notes (Signed)
Pt from home with SOB starting at 8pm and gradually getting worse. Pt was given 10mg  of cartizem with EMS for A-fib that decreased the heart rate to 80-130 from 180. Pt states she feels better after the cartizem

## 2016-08-21 ENCOUNTER — Emergency Department (HOSPITAL_COMMUNITY): Payer: Medicare Other

## 2016-08-21 ENCOUNTER — Encounter (HOSPITAL_COMMUNITY): Payer: Self-pay | Admitting: Internal Medicine

## 2016-08-21 DIAGNOSIS — Z952 Presence of prosthetic heart valve: Secondary | ICD-10-CM | POA: Diagnosis not present

## 2016-08-21 DIAGNOSIS — Z953 Presence of xenogenic heart valve: Secondary | ICD-10-CM | POA: Diagnosis not present

## 2016-08-21 DIAGNOSIS — D72829 Elevated white blood cell count, unspecified: Secondary | ICD-10-CM | POA: Diagnosis present

## 2016-08-21 DIAGNOSIS — I5031 Acute diastolic (congestive) heart failure: Secondary | ICD-10-CM | POA: Diagnosis not present

## 2016-08-21 DIAGNOSIS — E875 Hyperkalemia: Secondary | ICD-10-CM | POA: Diagnosis not present

## 2016-08-21 DIAGNOSIS — I110 Hypertensive heart disease with heart failure: Secondary | ICD-10-CM | POA: Diagnosis present

## 2016-08-21 DIAGNOSIS — S270XXA Traumatic pneumothorax, initial encounter: Secondary | ICD-10-CM | POA: Diagnosis not present

## 2016-08-21 DIAGNOSIS — E872 Acidosis: Secondary | ICD-10-CM | POA: Diagnosis not present

## 2016-08-21 DIAGNOSIS — I429 Cardiomyopathy, unspecified: Secondary | ICD-10-CM | POA: Diagnosis present

## 2016-08-21 DIAGNOSIS — Z950 Presence of cardiac pacemaker: Secondary | ICD-10-CM | POA: Diagnosis not present

## 2016-08-21 DIAGNOSIS — I4891 Unspecified atrial fibrillation: Secondary | ICD-10-CM

## 2016-08-21 DIAGNOSIS — I509 Heart failure, unspecified: Secondary | ICD-10-CM

## 2016-08-21 DIAGNOSIS — Z7189 Other specified counseling: Secondary | ICD-10-CM | POA: Diagnosis not present

## 2016-08-21 DIAGNOSIS — I4901 Ventricular fibrillation: Secondary | ICD-10-CM | POA: Diagnosis not present

## 2016-08-21 DIAGNOSIS — I5032 Chronic diastolic (congestive) heart failure: Secondary | ICD-10-CM | POA: Diagnosis not present

## 2016-08-21 DIAGNOSIS — Z79899 Other long term (current) drug therapy: Secondary | ICD-10-CM | POA: Diagnosis not present

## 2016-08-21 DIAGNOSIS — G931 Anoxic brain damage, not elsewhere classified: Secondary | ICD-10-CM | POA: Diagnosis not present

## 2016-08-21 DIAGNOSIS — E1165 Type 2 diabetes mellitus with hyperglycemia: Secondary | ICD-10-CM | POA: Diagnosis present

## 2016-08-21 DIAGNOSIS — R0602 Shortness of breath: Secondary | ICD-10-CM | POA: Diagnosis present

## 2016-08-21 DIAGNOSIS — E118 Type 2 diabetes mellitus with unspecified complications: Secondary | ICD-10-CM | POA: Diagnosis not present

## 2016-08-21 DIAGNOSIS — Z515 Encounter for palliative care: Secondary | ICD-10-CM | POA: Diagnosis not present

## 2016-08-21 DIAGNOSIS — I5043 Acute on chronic combined systolic (congestive) and diastolic (congestive) heart failure: Secondary | ICD-10-CM | POA: Diagnosis present

## 2016-08-21 DIAGNOSIS — J9601 Acute respiratory failure with hypoxia: Secondary | ICD-10-CM

## 2016-08-21 DIAGNOSIS — I469 Cardiac arrest, cause unspecified: Secondary | ICD-10-CM | POA: Diagnosis not present

## 2016-08-21 DIAGNOSIS — Z87891 Personal history of nicotine dependence: Secondary | ICD-10-CM | POA: Diagnosis not present

## 2016-08-21 DIAGNOSIS — N179 Acute kidney failure, unspecified: Secondary | ICD-10-CM | POA: Diagnosis not present

## 2016-08-21 DIAGNOSIS — I482 Chronic atrial fibrillation: Secondary | ICD-10-CM | POA: Diagnosis present

## 2016-08-21 DIAGNOSIS — G9340 Encephalopathy, unspecified: Secondary | ICD-10-CM | POA: Diagnosis not present

## 2016-08-21 DIAGNOSIS — Z7984 Long term (current) use of oral hypoglycemic drugs: Secondary | ICD-10-CM | POA: Diagnosis not present

## 2016-08-21 DIAGNOSIS — E1142 Type 2 diabetes mellitus with diabetic polyneuropathy: Secondary | ICD-10-CM | POA: Diagnosis present

## 2016-08-21 DIAGNOSIS — R570 Cardiogenic shock: Secondary | ICD-10-CM | POA: Diagnosis not present

## 2016-08-21 DIAGNOSIS — D649 Anemia, unspecified: Secondary | ICD-10-CM | POA: Diagnosis present

## 2016-08-21 DIAGNOSIS — E785 Hyperlipidemia, unspecified: Secondary | ICD-10-CM | POA: Diagnosis present

## 2016-08-21 DIAGNOSIS — J939 Pneumothorax, unspecified: Secondary | ICD-10-CM | POA: Diagnosis not present

## 2016-08-21 DIAGNOSIS — Z7901 Long term (current) use of anticoagulants: Secondary | ICD-10-CM | POA: Diagnosis not present

## 2016-08-21 LAB — BASIC METABOLIC PANEL
ANION GAP: 13 (ref 5–15)
Anion gap: 11 (ref 5–15)
BUN: 16 mg/dL (ref 6–20)
BUN: 22 mg/dL — AB (ref 6–20)
CALCIUM: 9.1 mg/dL (ref 8.9–10.3)
CO2: 22 mmol/L (ref 22–32)
CO2: 23 mmol/L (ref 22–32)
CREATININE: 0.86 mg/dL (ref 0.44–1.00)
Calcium: 9.2 mg/dL (ref 8.9–10.3)
Chloride: 102 mmol/L (ref 101–111)
Chloride: 102 mmol/L (ref 101–111)
Creatinine, Ser: 0.7 mg/dL (ref 0.44–1.00)
GFR calc Af Amer: >60 mL/min (ref >60)
GFR calc Af Amer: >60 mL/min (ref >60)
GFR calc non Af Amer: >60 mL/min (ref >60)
GFR, EST NON AFRICAN AMERICAN: 59 mL/min — AB (ref >60)
GLUCOSE: 166 mg/dL — AB (ref 65–99)
Glucose, Bld: 201 mg/dL — ABNORMAL HIGH (ref 65–99)
Potassium: 4 mmol/L (ref 3.5–5.1)
Potassium: 4.3 mmol/L (ref 3.5–5.1)
SODIUM: 136 mmol/L (ref 135–145)
Sodium: 137 mmol/L (ref 135–145)

## 2016-08-21 LAB — GLUCOSE, CAPILLARY
GLUCOSE-CAPILLARY: 143 mg/dL — AB (ref 65–99)
GLUCOSE-CAPILLARY: 208 mg/dL — AB (ref 65–99)
GLUCOSE-CAPILLARY: 152 mg/dL — AB (ref 65–99)
Glucose-Capillary: 185 mg/dL — ABNORMAL HIGH (ref 65–99)

## 2016-08-21 LAB — CBC WITH DIFFERENTIAL/PLATELET
BASOS ABS: 0 10*3/uL (ref 0.0–0.1)
BASOS PCT: 0 %
Basophils Absolute: 0 10*3/uL (ref 0.0–0.1)
Basophils Relative: 0 %
EOS ABS: 0.1 10*3/uL (ref 0.0–0.7)
EOS PCT: 1 %
Eosinophils Absolute: 0.1 10*3/uL (ref 0.0–0.7)
Eosinophils Relative: 0 %
HCT: 36.3 % (ref 36.0–46.0)
HEMATOCRIT: 35.3 % — AB (ref 36.0–46.0)
Hemoglobin: 11.5 g/dL — ABNORMAL LOW (ref 12.0–15.0)
Hemoglobin: 11.8 g/dL — ABNORMAL LOW (ref 12.0–15.0)
LYMPHS ABS: 1.6 10*3/uL (ref 0.7–4.0)
LYMPHS PCT: 12 %
LYMPHS PCT: 8 %
Lymphs Abs: 1.3 10*3/uL (ref 0.7–4.0)
MCH: 29.1 pg (ref 26.0–34.0)
MCH: 29.2 pg (ref 26.0–34.0)
MCHC: 32.5 g/dL (ref 30.0–36.0)
MCHC: 32.6 g/dL (ref 30.0–36.0)
MCV: 89.4 fL (ref 78.0–100.0)
MCV: 89.9 fL (ref 78.0–100.0)
MONO ABS: 0.9 10*3/uL (ref 0.1–1.0)
MONO ABS: 1 10*3/uL (ref 0.1–1.0)
MONOS PCT: 8 %
Monocytes Relative: 5 %
NEUTROS ABS: 10.4 10*3/uL — AB (ref 1.7–7.7)
Neutro Abs: 14.2 10*3/uL — ABNORMAL HIGH (ref 1.7–7.7)
Neutrophils Relative %: 80 %
Neutrophils Relative %: 86 %
Platelets: 250 10*3/uL (ref 150–400)
Platelets: 257 10*3/uL (ref 150–400)
RBC: 3.95 MIL/uL (ref 3.87–5.11)
RBC: 4.04 MIL/uL (ref 3.87–5.11)
RDW: 14.4 % (ref 11.5–15.5)
RDW: 14.6 % (ref 11.5–15.5)
WBC: 13.1 10*3/uL — ABNORMAL HIGH (ref 4.0–10.5)
WBC: 16.5 10*3/uL — ABNORMAL HIGH (ref 4.0–10.5)

## 2016-08-21 LAB — URINE MICROSCOPIC-ADD ON

## 2016-08-21 LAB — TROPONIN I
TROPONIN I: 0.05 ng/mL — AB (ref <0.03)
Troponin I: 0.05 ng/mL (ref <0.03)
Troponin I: 0.06 ng/mL (ref <0.03)

## 2016-08-21 LAB — URINALYSIS, ROUTINE W REFLEX MICROSCOPIC
BILIRUBIN URINE: NEGATIVE
Glucose, UA: 100 mg/dL — AB
Hgb urine dipstick: NEGATIVE
Ketones, ur: 15 mg/dL — AB
NITRITE: NEGATIVE
PH: 5 (ref 5.0–8.0)
Protein, ur: NEGATIVE mg/dL
SPECIFIC GRAVITY, URINE: 1.021 (ref 1.005–1.030)

## 2016-08-21 LAB — MAGNESIUM: MAGNESIUM: 2 mg/dL (ref 1.7–2.4)

## 2016-08-21 LAB — HEPATIC FUNCTION PANEL
ALT: 16 U/L (ref 14–54)
AST: 31 U/L (ref 15–41)
Albumin: 3.9 g/dL (ref 3.5–5.0)
Alkaline Phosphatase: 81 U/L (ref 38–126)
BILIRUBIN DIRECT: 0.2 mg/dL (ref 0.1–0.5)
BILIRUBIN INDIRECT: 0.9 mg/dL (ref 0.3–0.9)
BILIRUBIN TOTAL: 1.1 mg/dL (ref 0.3–1.2)
Total Protein: 7.5 g/dL (ref 6.5–8.1)

## 2016-08-21 LAB — PROTIME-INR
INR: 4.31 — AB
PROTHROMBIN TIME: 41.3 s — AB (ref 11.4–15.2)

## 2016-08-21 LAB — APTT: aPTT: 55 seconds — ABNORMAL HIGH (ref 24–36)

## 2016-08-21 LAB — DIGOXIN LEVEL: DIGOXIN LVL: 0.7 ng/mL — AB (ref 0.8–2.0)

## 2016-08-21 LAB — MRSA PCR SCREENING: MRSA by PCR: NEGATIVE

## 2016-08-21 LAB — TSH: TSH: 2.261 u[IU]/mL (ref 0.350–4.500)

## 2016-08-21 LAB — BRAIN NATRIURETIC PEPTIDE: B NATRIURETIC PEPTIDE 5: 273.3 pg/mL — AB (ref 0.0–100.0)

## 2016-08-21 MED ORDER — DIGOXIN 125 MCG PO TABS
0.1250 mg | ORAL_TABLET | Freq: Every day | ORAL | Status: DC
  Administered 2016-08-21 – 2016-08-23 (×3): 0.125 mg via ORAL
  Filled 2016-08-21 (×3): qty 1

## 2016-08-21 MED ORDER — METOPROLOL TARTRATE 50 MG PO TABS
150.0000 mg | ORAL_TABLET | Freq: Two times a day (BID) | ORAL | Status: DC
  Administered 2016-08-21: 150 mg via ORAL
  Filled 2016-08-21 (×2): qty 3

## 2016-08-21 MED ORDER — FUROSEMIDE 10 MG/ML IJ SOLN
40.0000 mg | Freq: Two times a day (BID) | INTRAMUSCULAR | Status: DC
  Administered 2016-08-21 – 2016-08-22 (×3): 40 mg via INTRAVENOUS
  Filled 2016-08-21 (×3): qty 4

## 2016-08-21 MED ORDER — ACETAMINOPHEN 650 MG RE SUPP: 650.0000 mg | Freq: Four times a day (QID) | RECTAL | Status: DC | PRN

## 2016-08-21 MED ORDER — GABAPENTIN 300 MG PO CAPS
300.0000 mg | ORAL_CAPSULE | Freq: Every morning | ORAL | Status: DC
  Administered 2016-08-21 – 2016-08-23 (×3): 300 mg via ORAL
  Filled 2016-08-21 (×3): qty 1

## 2016-08-21 MED ORDER — GABAPENTIN 300 MG PO CAPS: 300.0000 mg | ORAL_CAPSULE | ORAL | Status: DC

## 2016-08-21 MED ORDER — FUROSEMIDE 40 MG PO TABS: 40.0000 mg | ORAL_TABLET | Freq: Every day | ORAL | Status: DC

## 2016-08-21 MED ORDER — ONDANSETRON HCL 4 MG PO TABS: 4.0000 mg | ORAL_TABLET | Freq: Four times a day (QID) | ORAL | Status: DC | PRN

## 2016-08-21 MED ORDER — LEVOTHYROXINE SODIUM 50 MCG PO TABS
50.0000 ug | ORAL_TABLET | Freq: Every day | ORAL | Status: DC
  Administered 2016-08-21 – 2016-08-23 (×3): 50 ug via ORAL
  Filled 2016-08-21 (×3): qty 1

## 2016-08-21 MED ORDER — CALCIUM CARBONATE 1250 (500 CA) MG PO TABS
1200.0000 mg | ORAL_TABLET | ORAL | Status: DC
  Administered 2016-08-21 – 2016-08-23 (×2): 1250 mg via ORAL
  Filled 2016-08-21 (×2): qty 1

## 2016-08-21 MED ORDER — LISINOPRIL 2.5 MG PO TABS
2.5000 mg | ORAL_TABLET | Freq: Every day | ORAL | Status: DC
  Administered 2016-08-21: 2.5 mg via ORAL
  Filled 2016-08-21 (×2): qty 1

## 2016-08-21 MED ORDER — DILTIAZEM HCL ER COATED BEADS 240 MG PO CP24
240.0000 mg | ORAL_CAPSULE | Freq: Every day | ORAL | Status: DC
  Administered 2016-08-21 – 2016-08-23 (×3): 240 mg via ORAL
  Filled 2016-08-21 (×3): qty 1

## 2016-08-21 MED ORDER — POTASSIUM CHLORIDE CRYS ER 20 MEQ PO TBCR
20.0000 meq | EXTENDED_RELEASE_TABLET | Freq: Two times a day (BID) | ORAL | Status: DC
  Administered 2016-08-21 – 2016-08-23 (×5): 20 meq via ORAL
  Filled 2016-08-21 (×5): qty 1

## 2016-08-21 MED ORDER — SIMVASTATIN 20 MG PO TABS
10.0000 mg | ORAL_TABLET | Freq: Every day | ORAL | Status: DC
  Administered 2016-08-21 – 2016-08-22 (×2): 10 mg via ORAL
  Filled 2016-08-21 (×2): qty 1

## 2016-08-21 MED ORDER — GABAPENTIN 300 MG PO CAPS
900.0000 mg | ORAL_CAPSULE | Freq: Every day | ORAL | Status: DC
  Administered 2016-08-21 – 2016-08-22 (×2): 900 mg via ORAL
  Filled 2016-08-21 (×2): qty 3

## 2016-08-21 MED ORDER — ACETAMINOPHEN 325 MG PO TABS: 650.0000 mg | ORAL_TABLET | Freq: Four times a day (QID) | ORAL | Status: DC | PRN

## 2016-08-21 MED ORDER — INSULIN ASPART 100 UNIT/ML ~~LOC~~ SOLN
0.0000 [IU] | Freq: Three times a day (TID) | SUBCUTANEOUS | Status: DC
  Administered 2016-08-21: 2 [IU] via SUBCUTANEOUS
  Administered 2016-08-21: 1 [IU] via SUBCUTANEOUS
  Administered 2016-08-21: 3 [IU] via SUBCUTANEOUS
  Administered 2016-08-22: 1 [IU] via SUBCUTANEOUS
  Administered 2016-08-22: 3 [IU] via SUBCUTANEOUS
  Administered 2016-08-22: 5 [IU] via SUBCUTANEOUS

## 2016-08-21 MED ORDER — METOPROLOL TARTRATE 50 MG PO TABS
100.0000 mg | ORAL_TABLET | Freq: Two times a day (BID) | ORAL | Status: DC
  Administered 2016-08-21: 100 mg via ORAL
  Filled 2016-08-21: qty 2

## 2016-08-21 MED ORDER — SODIUM CHLORIDE 0.9% FLUSH
3.0000 mL | Freq: Two times a day (BID) | INTRAVENOUS | Status: DC
  Administered 2016-08-21 – 2016-08-22 (×4): 3 mL via INTRAVENOUS

## 2016-08-21 MED ORDER — ONDANSETRON HCL 4 MG/2ML IJ SOLN: 4.0000 mg | Freq: Four times a day (QID) | INTRAMUSCULAR | Status: DC | PRN

## 2016-08-21 NOTE — Care Management Note (Signed)
Case Management Note  Patient Details  Name: [name] MRN: [id number] Date of Birth: 03/06/1927  Subjective/Objective:             Adm w chf       Action/Plan:from home, act w adv homecare for hhpt, will add hhrn for chf follow up   Expected Discharge Date:                  Expected Discharge Plan:     In-House Referral:     Discharge planning Services     Post Acute Care Choice:    Choice offered to:     DME Arranged:    DME Agency:     HH Arranged:    HH Agency:     Status of Service:     If discussed at [employer] Meetings, dates discussed:    Additional Comments:spoke w jermaine w [facility] to let him know of pt's adm.  Hanley Haysowell, Amilia Vandenbrink T, RN 08/21/2016, 10:51 AM

## 2016-08-21 NOTE — ED Notes (Signed)
Patient transported to X-ray 

## 2016-08-21 NOTE — Consult Note (Signed)
Cardiology Consult    Patient ID: [name] MRN: [id number], DOB/AGE: [birth year]   Admit date: 13-Jul-2016 Date of Consult: 08/21/2016  Primary Physician: Lorenda PeckOBERTS, RONALD WAYNE, MD Reason for Consult: CHF Primary Cardiologist: Dr. Excell Seltzerooper Requesting Provider: Dr. Sharon SellerMcClung  Patient Profile    [name] is a 82 year old female with a past medical history of TAVR (23mm Stephani Policedward Sapien 3 valve) on 07/04/2016, chronic diastolic CHF, DM, HLD, HTN, and  tachy-brady syndrome s/p SJM single chamber PPM. She presented to the ED on Jul 08, 2016 with SOB and rapid Afib.   History of Present Illness    [name] presented with atrial fibrillation with RVR with a rate of 143 on Jul 08, 2016. She had gone out for pizza with her nephew and when she returned home she felt palpitations and became acutely SOB. She was brought to the ED, chest x ray showed bilateral pleural effusions. She was started on IV diltiazem, which she tolerated well.   She has a history of chronic atrial fibrillation and is on Coumadin at home. Now rate controlled and was transitioned to 240mg  po diltiazem today.   Her last Echo was on 08/03/16, EF was 55% at that time.   Past Medical History   Past Medical History:  Diagnosis Date  . Arthritis   . Chronic diastolic congestive heart failure (HCC)   . Diabetes mellitus   . History of peptic ulcer disease   . Hypercholesteremia   . Hyperlipidemia   . Hypertension   . Hypothyroidism   . Peripheral neuropathy (HCC)   . Permanent atrial fibrillation (HCC)   . Presence of permanent cardiac pacemaker   . 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 Severe aortic stenosis   . Tachycardia-bradycardia syndrome (HCC)    a. s/p SJM single chamber PPM implanted by Dr Reyes IvanKersey 2009  . Thrombocytopenia (HCC)   . Thyroid disease     Past Surgical History:  Procedure  Laterality Date  . ABDOMINAL HYSTERECTOMY    . APPENDECTOMY    . BREAST BIOPSY  02/22/2007    left  . CARDIAC CATHETERIZATION N/A 06/14/2016   Procedure: Left Heart Cath and Coronary Angiography;  Surgeon: Tonny BollmanMichael Cooper, MD;  [facility];  Service: Cardiovascular;  Laterality: N/A;  . CARDIOVERSION  05/16/2005   Electrical cardioversion   . ESOPHAGOGASTRODUODENOSCOPY  03/12/2007    Esophagogastroduodenoscopy with control of bleeding  . PACEMAKER INSERTION  06/18/2008   SJM Zephyr XL SR implanted by Dr Reyes IvanKersey  . SHOULDER SURGERY  12/07/2004  . TEE WITHOUT CARDIOVERSION N/A 07/04/2016   Procedure: TRANSESOPHAGEAL ECHOCARDIOGRAM (TEE);  Surgeon: Tonny BollmanMichael Cooper, MD;  [facility];  Service: Open Heart Surgery;  Laterality: N/A;  . TRANSCATHETER AORTIC VALVE REPLACEMENT, TRANSFEMORAL N/A 07/04/2016   Procedure: TRANSCATHETER AORTIC VALVE REPLACEMENT, TRANSFEMORAL;  Surgeon: Tonny BollmanMichael Cooper, MD;  [facility];  Service: Open Heart Surgery;  Laterality: N/A;     Allergies  Allergies  Allergen Reactions  . Amiodarone Other (See Comments)    Stops her heart  . Aspirin Other (See Comments)    Gi bleeding  . Codeine Other (See Comments)    GI Lead  . Tramadol Nausea And Vomiting  . Lipitor [Atorvastatin Calcium] Other (See Comments)    Muscle Ache  . Pravachol Other (See Comments)    Muscle Ache    Inpatient Medications    .  calcium carbonate  1,250 mg Oral Once per day on Mon Wed Fri  . digoxin  0.125 mg Oral Daily  . diltiazem  240 mg Oral Daily  . furosemide  40 mg Intravenous Q12H  . gabapentin  300 mg Oral q morning - 10a   And  . gabapentin  900 mg Oral QHS  . insulin aspart  0-9 Units Subcutaneous TID WC  . levothyroxine  50 mcg Oral QAC breakfast  . lisinopril  2.5 mg Oral Daily  . metoprolol  100 mg Oral BID  . potassium chloride SA  20 mEq Oral BID  . simvastatin  10 mg Oral q1800  . sodium chloride flush  3 mL Intravenous Q12H    Family  History    Family History  Problem Relation Age of Onset  . Heart disease Father   . Emphysema Father   . Heart failure Father   . Coronary artery disease Father   . Stroke Sister     cerebral hemorrhage  . Breast cancer Sister   . Heart disease Sister   . Heart failure Sister   . Heart attack Neg Hx     Social History    Social History   Social History  . Marital status: Widowed    Spouse name: N/A  . Number of children: N/A  . Years of education: N/A   Occupational History  . Retired    Social History Main Topics  . Smoking status: Former Smoker    Packs/day: 1.00    Years: 10.00    Types: Cigarettes    Quit date: 03/06/1961  . Smokeless tobacco: Never Used  . Alcohol use No  . Drug use: No  . Sexual activity: Not on file   Other Topics Concern  . Not on file   Social History Narrative   [place]     Review of Systems    General:  No chills, fever, night sweats or weight changes.  Cardiovascular:  No chest pain, dyspnea on exertion, edema, orthopnea, palpitations, paroxysmal nocturnal dyspnea. Dermatological: No rash, lesions/masses Respiratory: No cough, dyspnea Urologic: No hematuria, dysuria Abdominal:   No nausea, vomiting, diarrhea, bright red blood per rectum, melena, or hematemesis Neurologic:  No visual changes, wkns, changes in mental status. All other systems reviewed and are otherwise negative except as noted above.  Physical Exam    Blood pressure (!) 104/56, pulse 64, temperature 97.6 F (36.4 C), temperature source Axillary, resp. rate 17, height 5\' 6"  (1.676 m), weight 151 lb 7.3 oz (68.7 kg), SpO2 96 %.  General: Pleasant, NAD Psych: Normal affect. Neuro: Alert and oriented X 3. Moves all extremities spontaneously. HEENT: Normal  Neck: Supple without bruits or JVD. Lungs:  Resp regular and unlabored, CTA. Heart: RRR no s3, s4, or murmurs. Abdomen: Soft, non-tender, non-distended, BS + x 4.  Extremities: No  clubbing, cyanosis or edema. DP/PT/Radials 2+ and equal bilaterally.  Labs     Recent Labs  2016/05/13 2344 08/21/16 0650 08/21/16 1131  TROPONINI <0.03 0.05* 0.06*   Lab Results  Component Value Date   WBC 13.1 (H) 08/21/2016   HGB 11.5 (L) 08/21/2016   HCT 35.3 (L) 08/21/2016   MCV 89.4 08/21/2016   PLT 250 08/21/2016    Recent Labs Lab 08/21/16 0650  NA 137  K 4.0  CL 102  CO2 22  BUN 16  CREATININE 0.70  CALCIUM 9.1  PROT 7.5  BILITOT 1.1  ALKPHOS 81  ALT  16  AST 31  GLUCOSE 166*   No results found for: CHOL, HDL, LDLCALC, TRIG No results found for: [facility]   Radiology Studies    Dg Chest 2 View  Result Date: 08/21/2016 CLINICAL DATA:  Shortness of breath starting at 8 p.m. and gradually worsening. History of atrial fibrillation and hypertension. EXAM: CHEST  2 VIEW COMPARISON:  07/14/2016 FINDINGS: Postoperative changes in the mediastinum. Cardiac pacemaker. Mild cardiac enlargement. Increasing interstitial pattern to the lungs mostly in the periphery and lung bases, likely representing developing edema. Small bilateral pleural effusions. No focal consolidation. No pneumothorax. Calcified and tortuous aorta. Degenerative changes in the spine and shoulders. IMPRESSION: Cardiac enlargement with increasing interstitial pattern to the lungs suggesting developing interstitial edema. Small bilateral pleural effusions. Electronically Signed   By: Burman Nieves M.D.   On: 08/21/2016 01:18    EKG & Cardiac Imaging    EKG: Afib RVR   Echocardiogram:   08/03/16 Study Conclusions  - Left ventricle: The cavity size was normal. Wall thickness was   normal. The estimated ejection fraction was 55%. Wall motion was   normal; there were no regional wall motion abnormalities.   Indeterminant diastolic function (atrial fibrillation). - Aortic valve: There is a bioprosthetic aortic valve s/p TAVR.   Mild peri-valvular regurgitation. No significant stenosis. Mean    gradient (S): 11 mm Hg. - Mitral valve: Moderately calcified annulus. Moderately calcified   leaflets . There was mild regurgitation. - Left atrium: The atrium was moderately dilated. - Right ventricle: The cavity size was normal. Pacer wire or   catheter noted in right ventricle. Systolic function was normal. - Right atrium: The atrium was moderately dilated. - Tricuspid valve: Peak RV-RA gradient (S): 35 mm Hg. - Pulmonary arteries: PA peak pressure: 43 mm Hg (S). - Systemic veins: IVC measured 2.0 cm with < 50% respirophasic   variation, suggestiing RA pressure 8 mmHg.  Impressions:  - The patient was in atrial fibrillation. Normal LV size with EF   55%. Normal RV size and systolic function. Moderate biatrial   enlargement. Bioprosthetic aortic valve s/p TAVR. Mild   peri-valvular aortic insufficiency. Mild mitral regurgitation.   Mild pulmonary hypertension.   Assessment & Plan    1. Atrial fibrillation with RVR: Now rate controlled on po diltiazem. Continue digoxin,metoprolol 100mg  BID. Would increase metoprolol to 150mg  BID.   This patients CHA2DS2-VASc Score and unadjusted Ischemic Stroke Rate (% per year) is equal to 7.2 % stroke rate/year from a score of 5 Above score calculated as 1 point each if present [CHF, HTN, DM, Vascular=MI/PAD/Aortic Plaque, Age if 65-74, or Female], 2 points each if present [Age > 75, or Stroke/TIA/TE]  2. Chronic diastolic CHF: Exacerbated by Afib RVR. Would give 40mg  po Lasix BID at home. Can transition to oral tomorrow.   3. DM2: Management per primary team  4. HLD: On statin, continue same.   Signed, Little Ishikawa, NP 08/21/2016, 1:48 PM Pager: [phone]  The patient was seen, examined and discussed with Little Ishikawa, NP and I agree with the above.   82 year old female with a past medical history of TAVR (23mm Stephani Police 3 valve) on 07/04/2016, chronic diastolic CHF, DM, HLD, HTN, and  tachy-brady syndrome s/p SJM single chamber  PPM, now in persistent a-fib. LVEF 55%. She presented to the ED on 08/22/16 with SOB and rapid Afib associated with SOB. She was started on iv bilateral pleural effusions. She was started on IV diltiazem that slowed  down her HR from 140-->70 with improvement of symptoms. Now back on PO diltiazem and metoprolol. She was found to have B/L pleural effusions, started on iv lasix, I would continue till tomorrow and reevaluate in the morning. Crea 0.7, K 4.0. She has a bump on the forehead from a mechanical fall, she states that this was unusual, she doesn't fall otherwise.  Tobias Alexander 08/21/2016

## 2016-08-21 NOTE — Progress Notes (Signed)
[place] TEAM 1 - Stepdown/ICU TEAM  [name]  ZOX:[id number] DOB: 05-16-1927 DOA: 2016-05-05 PCP: Lorenda PeckOBERTS, RONALD WAYNE, MD    Brief Narrative:  82 y.o. female with history of TAVR in September 2017, tachybradycardia syndrome status post pacemaker placement, chronic atrial fibrillation, diabetes mellitus, hypothyroidism and hyperlipidemia who presented to the ER w/ persistent cough and shortness of breath for ~24hrs.  In the ER patient was found to be in A. fib with RVR and chest x-ray noted bilateral effusions with congestion. Patient was started on Cardizem infusion. Cardiology was consulted and requested Hospitalist admission.   Subjective: Pt is seen for a f/u visit.    Assessment & Plan:  Acute respiratory failure with hypoxia - Pulmonary Edema  Resolved - net negative ~350cc since admit - baseline wgt as of Oct 2017 ~65kg - diurese and follow - TTE 08/03/16 noted EF 55% w/ no WMA but indeterminate diastolic fxn - suspect pulmonary edema due to diminished CO related to RVR   Filed Weights   08/21/16 0451  Weight: 68.7 kg (151 lb 7.3 oz)    Chronic A. fib with acute RVR Coumadin per pharmacy - CHA2DS2 - VASc is 2+ - rate now well controlled - titrated off cardizem gtt   Status post transcatheter aortic valve replacement September 2017 Cardiology consulted in the ED but refused to admit  DM2  Hypothyroidism on Synthroid  Hyperlipidemia on statin  History of tachybradycardia syndrome status post pacemaker placement  DVT prophylaxis: warfarin  Code Status: FULL CODE Family Communication: no family present at time of exam  Disposition Plan: diurese overnight - ambulate - follow on tele - d/c home 11/21 if HR controlled   Cons[facility]ants[facility]  [facility] (called by ED)  Procedures: none  Antimicrobials:  none  Objective: Blood pressure (!) 104/56, pulse 64, temperature 97.6 F (36.4 C), temperature source Axillary, resp. rate 17, height 5\' 6"  (1.676 m),  weight 68.7 kg (151 lb 7.3 oz), SpO2 96 %.  Intake/Output Summary (Last 24 hours) at 08/21/16 1310 Last data filed at 08/21/16 1159  Gross per 24 hour  Intake              986 ml  Output              900 ml  Net               86 ml   Filed Weights   08/21/16 0451  Weight: 68.7 kg (151 lb 7.3 oz)    Examination: Pt was seen for a f/u visit.    CBC:  Recent Labs Lab 08-17-16 2355 08/21/16 0650  WBC 16.5* 13.1*  NEUTROABS 14.2* 10.4*  HGB 11.8* 11.5*  HCT 36.3 35.3*  MCV 89.9 89.4  PLT 257 250   Basic Metabolic Panel:  Recent Labs Lab 08-17-16 2344 08/21/16 0650  NA 136 137  K 4.3 4.0  CL 102 102  CO2 23 22  GLUCOSE 201* 166*  BUN 22* 16  CREATININE 0.86 0.70  CALCIUM 9.2 9.1  MG 2.0  --    GFR: Estimated Creatinine Clearance: 46.4 mL/min (by C-G formula based on SCr of 0.7 mg/dL).  Liver Function Tests:  Recent Labs Lab 08/21/16 0650  AST 31  ALT 16  ALKPHOS 81  BILITOT 1.1  PROT 7.5  ALBUMIN 3.9    Coagulation Profile:  Recent Labs Lab 08/16/16 1210 08-17-16 2344  INR 3.7 4.31*    Cardiac Enzymes:  Recent Labs Lab 08-17-16 2344 08/21/16 0650  08/21/16 1131  TROPONINI <0.03 0.05* 0.06*    HbA1C: Hgb A1c MFr Bld  Date/Time Value Ref Range Status  06/29/2016 02:04 PM 7.1 (H) 4.8 - 5.6 % Final    Comment:    (NOTE)         Pre-diabetes: 5.7 - 6.4         Diabetes: >6.4         Glycemic control for adults with diabetes: <7.0   03/12/2007 08:00 AM   Final   6.0 (NOTE)   The ADA recommends the following therapeutic goals for glycemic   control related to Hgb A1C measurement:   Goal of Therapy:   < 7.0% Hgb A1C   Action Suggested:  > 8.0% Hgb A1C   Ref:  Diabetes Care, 22, Suppl. 1, 1999    CBG:  Recent Labs Lab 08/21/16 0818 08/21/16 1139  GLUCAP 143* 208*    Recent Results (from the past 240 hour(s))  MRSA PCR Screening     Status: None   Collection Time: 08/21/16  4:58 AM  Result Value Ref Range Status   MRSA by PCR  NEGATIVE NEGATIVE Final    Comment:        The GeneXpert MRSA Assay (FDA approved for NASAL specimens only), is one component of a comprehensive MRSA colonization surveillance program. It is not intended to diagnose MRSA infection nor to guide or monitor treatment for MRSA infections.      Scheduled Meds: . calcium carbonate  1,250 mg Oral Once per day on Mon Wed Fri  . digoxin  0.125 mg Oral Daily  . diltiazem  240 mg Oral Daily  . furosemide  40 mg Intravenous Q12H  . gabapentin  300 mg Oral q morning - 10a   And  . gabapentin  900 mg Oral QHS  . insulin aspart  0-9 Units Subcutaneous TID WC  . levothyroxine  50 mcg Oral QAC breakfast  . lisinopril  2.5 mg Oral Daily  . metoprolol  100 mg Oral BID  . potassium chloride SA  20 mEq Oral BID  . simvastatin  10 mg Oral q1800  . sodium chloride flush  3 mL Intravenous Q12H      LOS: 0 days   Time spent: No Charge  [name], MD [facility] Office  [phone] Pager - Text Page per Amion as per below:  On-Call/Text Page:      [email or web address]      password [email or web address]  If 7PM-7AM, please contact night-coverage [email or web address] Password [email or web address] 08/21/2016, 1:10 PM

## 2016-08-21 NOTE — Progress Notes (Signed)
ANTICOAGULATION CONSULT NOTE   Pharmacy Consult for warfarin Indication: atrial fibrillation  Allergies  Allergen Reactions  . Amiodarone Other (See Comments)    Stops her heart  . Aspirin Other (See Comments)    Gi bleeding  . Codeine Other (See Comments)    GI Lead  . Tramadol Nausea And Vomiting  . Lipitor [Atorvastatin Calcium] Other (See Comments)    Muscle Ache  . Pravachol Other (See Comments)    Muscle Ache    Patient Measurements: Height: 5\' 6"  (167.6 cm) Weight: 151 lb 7.3 oz (68.7 kg) IBW/kg (Calculated) : 59.3  Vital Signs: Temp: 98.4 F (36.9 C) (11/19 2338) Temp Source: Oral (11/19 2338) BP: 137/83 (11/20 0400) Pulse Rate: 116 (11/20 0400)  Labs:  Recent Labs  08/01/16 2344 08/01/16 2355  HGB  --  11.8*  HCT  --  36.3  PLT  --  257  APTT 55*  --   LABPROT 41.3*  --   INR 4.31*  --   CREATININE 0.86  --   TROPONINI <0.03  --     Estimated Creatinine Clearance: 43.1 mL/min (by C-G formula based on SCr of 0.86 mg/dL).   Medical History: Past Medical History:  Diagnosis Date  . Arthritis   . Chronic diastolic congestive heart failure (HCC)   . Diabetes mellitus   . History of peptic ulcer disease   . Hypercholesteremia   . Hyperlipidemia   . Hypertension   . Hypothyroidism   . Peripheral neuropathy (HCC)   . Permanent atrial fibrillation (HCC)   . Presence of permanent cardiac pacemaker   . 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 Severe aortic stenosis   . Tachycardia-bradycardia syndrome (HCC)    a. s/p SJM single chamber PPM implanted by Dr Reyes IvanKersey 2009  . Thrombocytopenia (HCC)   . Thyroid disease     Assessment: [age] yoF with chronic atrial fibrillation on warfarin PTA. Admit INR 4.3 with stable hgb and PLTc.  PTA dose of warfarin is 2.5 mg daily except 1.25 mg on Wednesday.   Goal of Therapy:  INR  2-3 Monitor platelets by anticoagulation protocol: Yes   Plan:  -Hold warfarin for today  -Daily INR starting on 11/21 -CBC daily   Pollyann SamplesAndy Alexandre Lightsey, PharmD, BCPS 08/21/2016, 5:55 AM Pager: [phone]

## 2016-08-21 NOTE — H&P (Addendum)
History and Physical    [name] ZOX:[id number] DOB: 03-09-1927 DOA: Sep 08, 2016  PCP: Lorenda Peck, MD  Patient coming from: Home.  Chief Complaint: Shortness of breath.  HPI: [name] is a 82 y.o. female with history of status post TAVR in September this year, tachybradycardia syndrome status post pacemaker placement, chronic atrial fibrillation, diabetes mellitus, hypothyroidism and hyperlipidemia presents to the ER because of persistent cough and shortness of breath. Patient states that symptoms started last evening around 8 PM. Patient states until then she was doing fine. Denies any chest pain. Has had some palpitations. Shortness of breath increases on lying flat. In the ER patient was found to be in A. fib with RVR and chest x-ray was showing bilateral effusion with congestion. Patient was started on Cardizem infusion. Cardiology was consulted and requested hospitalist admission. Patient denies missing her medications. Patient states she usually mows her yard.   ED Course: Cardizem infusion was started.  Review of Systems: As per HPI, rest all negative.   Past Medical History:  Diagnosis Date  . Arthritis   . Chronic diastolic congestive heart failure (HCC)   . Diabetes mellitus   . History of peptic ulcer disease   . Hypercholesteremia   . Hyperlipidemia   . Hypertension   . Hypothyroidism   . Peripheral neuropathy (HCC)   . Permanent atrial fibrillation (HCC)   . Presence of permanent cardiac pacemaker   . 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 Severe aortic stenosis   . Tachycardia-bradycardia syndrome (HCC)    a. s/p SJM single chamber PPM implanted by Dr Reyes Ivan 2009  . Thrombocytopenia (HCC)   . Thyroid disease     Past Surgical History:  Procedure Laterality Date  . ABDOMINAL HYSTERECTOMY    . APPENDECTOMY    .  BREAST BIOPSY  02/22/2007    left  . CARDIAC CATHETERIZATION N/A 06/14/2016   Procedure: Left Heart Cath and Coronary Angiography;  Surgeon: Tonny Bollman, MD;  Location: [facility] INVASIVE CV LAB;  Service: Cardiovascular;  Laterality: N/A;  . CARDIOVERSION  05/16/2005   Electrical cardioversion   . ESOPHAGOGASTRODUODENOSCOPY  03/12/2007    Esophagogastroduodenoscopy with control of bleeding  . PACEMAKER INSERTION  06/18/2008   SJM Zephyr XL SR implanted by Dr Reyes Ivan  . SHOULDER SURGERY  12/07/2004  . TEE WITHOUT CARDIOVERSION N/A 07/04/2016   Procedure: TRANSESOPHAGEAL ECHOCARDIOGRAM (TEE);  Surgeon: Tonny Bollman, MD;  Location[facility];  Service: Open Heart Surgery;  Laterality: N/A;  . TRANSCATHETER AORTIC VALVE REPLACEMENT, TRANSFEMORAL N/A 07/04/2016   Procedure: TRANSCATHETER AORTIC VALVE REPLACEMENT, TRANSFEMORAL;  Surgeon: Tonny Bollman, MD;  Location: [facility] OR;  Service: Open Heart Surgery;  Laterality: N/A;     reports that she quit smoking about 55 years ago. Her smoking use included Cigarettes. She has a 10.00 pack-year smoking history. She has never used smokeless tobacco. She reports that she does not drink alcohol or use drugs.  Allergies  Allergen Reactions  . Amiodarone Other (See Comments)    Stops her heart  . Aspirin Other (See Comments)    Gi bleeding  . Codeine Other (See Comments)    GI Lead  . Tramadol Nausea And Vomiting  . Lipitor [Atorvastatin Calcium] Other (See Comments)    Muscle Ache  . Pravachol Other (See Comments)    Muscle Ache    Family History  Problem Relation Age  of Onset  . Heart disease Father   . Emphysema Father   . Heart failure Father   . Coronary artery disease Father   . Stroke Sister     cerebral hemorrhage  . Breast cancer Sister   . Heart disease Sister   . Heart failure Sister   . Heart attack Neg Hx     Prior to Admission medications   Medication Sig Start Date End Date Taking? Authorizing Provider  amoxicillin (AMOXIL) 500 MG  capsule Take 500 mg by mouth daily as needed (for dental appointments).  09/13/11  Yes Historical Provider, MD  calcium carbonate (OS-CAL) 600 MG TABS Take 1,200 mg by mouth 3 (three) times a week.    Yes Historical Provider, MD  digoxin (LANOXIN) 0.125 MG tablet Take 1 tablet (0.125 mg total) by mouth daily. 07/16/16  Yes Arty BaumgartnerLindsay B Roberts, NP  diltiazem (CARDIZEM CD) 240 MG 24 hr capsule Take 1 capsule (240 mg total) by mouth daily. 07/16/16  Yes Arty BaumgartnerLindsay B Roberts, NP  furosemide (LASIX) 40 MG tablet Begin taking 40mg  daily Patient taking differently: Take 40 mg by mouth daily.  07/15/16  Yes Arty BaumgartnerLindsay B Roberts, NP  gabapentin (NEURONTIN) 300 MG capsule Take 300-900 mg by mouth See admin instructions. Take 300 mg by mouth in the morning and take 900 mg by mouth in the evening. 01/23/11  Yes Historical Provider, MD  levothyroxine (SYNTHROID, LEVOTHROID) 50 MCG tablet Take 50 mcg by mouth daily.     Yes Historical Provider, MD  lisinopril (PRINIVIL,ZESTRIL) 2.5 MG tablet Take 2.5 mg by mouth daily. 08/14/12  Yes Historical Provider, MD  metFORMIN (GLUCOPHAGE) 500 MG tablet Take 1,000 mg by mouth 2 (two) times daily.  11/25/14  Yes Historical Provider, MD  metoprolol (LOPRESSOR) 100 MG tablet TAKE ONE TABLET BY MOUTH TWICE DAILY 08/14/16  Yes Tonny BollmanMichael Cooper, MD  Multiple Vitamins-Minerals (VISION-VITE PRESERVE PO) Take 1 capsule by mouth 2 (two) times daily.   Yes Historical Provider, MD  potassium chloride SA (K-DUR,KLOR-CON) 20 MEQ tablet Take 1 tablet (20 mEq total) by mouth 2 (two) times daily. 07/07/16  Yes Little IshikawaErin E Smith, NP  psyllium (METAMUCIL) 58.6 % powder Take 1 packet by mouth daily.   Yes Historical Provider, MD  simvastatin (ZOCOR) 10 MG tablet Take 1 tablet (10 mg total) by mouth daily. 07/08/16  Yes Little IshikawaErin E Smith, NP  warfarin (COUMADIN) 2.5 MG tablet Take 0.5 to 1 tablet daily as directed by Coumadin clinic. Patient taking differently: Take 1.25-2.5 mg by mouth See admin instructions. Take  1/2 tablet on Wednesday then take 1 tablet all there other days 08/17/16   Lewayne BuntingBrian S Crenshaw, MD    Physical Exam: Vitals:   08/21/16 0330 08/21/16 0345 08/21/16 0400 08/21/16 0451  BP: 149/77 141/85 137/83   Pulse: (!) 122 91 116   Resp: 26 19 20    Temp:      TempSrc:      SpO2: 93% 92% 91%   Weight:    68.7 kg (151 lb 7.3 oz)  Height:    5\' 6"  (1.676 m)      Constitutional: Moderately built and nourished. Vitals:   08/21/16 0330 08/21/16 0345 08/21/16 0400 08/21/16 0451  BP: 149/77 141/85 137/83   Pulse: (!) 122 91 116   Resp: 26 19 20    Temp:      TempSrc:      SpO2: 93% 92% 91%   Weight:    68.7 kg (151 lb 7.3 oz)  Height:  5\' 6"  (1.676 m)   Eyes: Anicteric no pallor. ENMT: No discharge from the ears eyes nose or mouth. Neck: JVP elevated no mass felt. Respiratory: No rhonchi or crepitations. Cardiovascular: S1 and S2 heard. No murmurs appreciated. Abdomen: Soft nontender bowel sounds present. No guarding or rigidity. Musculoskeletal: Mild edema bilaterally. Skin: No rash. Neurologic: Alert awake oriented to time place and person. Moves all extremities. Psychiatric: Appears normal. Normal affect.   Labs on Admission: I have personally reviewed following labs and imaging studies  CBC:  Recent Labs Lab 08/05/2016 2355  WBC 16.5*  NEUTROABS 14.2*  HGB 11.8*  HCT 36.3  MCV 89.9  PLT 257   Basic Metabolic Panel:  Recent Labs Lab 08/29/2016 2344  NA 136  K 4.3  CL 102  CO2 23  GLUCOSE 201*  BUN 22*  CREATININE 0.86  CALCIUM 9.2  MG 2.0   GFR: Estimated Creatinine Clearance: 43.1 mL/min (by C-G formula based on SCr of 0.86 mg/dL). Liver Function Tests: No results for input(s): AST, ALT, ALKPHOS, BILITOT, PROT, ALBUMIN in the last 168 hours. No results for input(s): LIPASE, AMYLASE in the last 168 hours. No results for input(s): AMMONIA in the last 168 hours. Coagulation Profile:  Recent Labs Lab 08/16/16 1210 08/04/2016 2344  INR 3.7 4.31*    Cardiac Enzymes:  Recent Labs Lab 08/15/2016 2344  TROPONINI <0.03   BNP (last 3 results) No results for input(s): PROBNP in the last 8760 hours. HbA1C: No results for input(s): HGBA1C in the last 72 hours. CBG: No results for input(s): GLUCAP in the last 168 hours. Lipid Profile: No results for input(s): CHOL, HDL, LDLCALC, TRIG, CHOLHDL, LDLDIRECT in the last 72 hours. Thyroid Function Tests: No results for input(s): TSH, T4TOTAL, FREET4, T3FREE, THYROIDAB in the last 72 hours. Anemia Panel: No results for input(s): VITAMINB12, FOLATE, FERRITIN, TIBC, IRON, RETICCTPCT in the last 72 hours. Urine analysis:    Component Value Date/Time   COLORURINE YELLOW 08/21/2016 0135   APPEARANCEUR CLEAR 08/21/2016 0135   LABSPEC 1.021 08/21/2016 0135   PHURINE 5.0 08/21/2016 0135   GLUCOSEU 100 (A) 08/21/2016 0135   HGBUR NEGATIVE 08/21/2016 0135   BILIRUBINUR NEGATIVE 08/21/2016 0135   KETONESUR 15 (A) 08/21/2016 0135   PROTEINUR NEGATIVE 08/21/2016 0135   NITRITE NEGATIVE 08/21/2016 0135   LEUKOCYTESUR SMALL (A) 08/21/2016 0135   Sepsis Labs: @LABRCNTIP (procalcitonin:4,lacticidven:4) )No results found for this or any previous visit (from the past 240 hour(s)).   Radiological Exams on Admission: Dg Chest 2 View  Result Date: 08/21/2016 CLINICAL DATA:  Shortness of breath starting at 8 p.m. and gradually worsening. History of atrial fibrillation and hypertension. EXAM: CHEST  2 VIEW COMPARISON:  07/14/2016 FINDINGS: Postoperative changes in the mediastinum. Cardiac pacemaker. Mild cardiac enlargement. Increasing interstitial pattern to the lungs mostly in the periphery and lung bases, likely representing developing edema. Small bilateral pleural effusions. No focal consolidation. No pneumothorax. Calcified and tortuous aorta. Degenerative changes in the spine and shoulders. IMPRESSION: Cardiac enlargement with increasing interstitial pattern to the lungs suggesting developing  interstitial edema. Small bilateral pleural effusions. Electronically Signed   By: Burman Nieves M.D.   On: 08/21/2016 01:18    EKG: Independently reviewed. A. fib with RVR.  Assessment/Plan Principal Problem:   Acute respiratory failure with hypoxia (HCC) Active Problems:   S/P TAVR (transcatheter aortic valve replacement)   Atrial fibrillation with RVR (HCC)   Acute diastolic CHF (congestive heart failure) (HCC)   CHF (congestive heart failure) (HCC)  1. Acute respiratory failure with hypoxia probably secondary to decompensated CHF - last EF measured 2 weeks ago was 55%. Patient's A. fib with RVR also contributing to her symptoms. Patient takes Lasix 60 mg by mouth daily. I have placed patient on Lasix 40 mg IV every 12. Closely follow intake output metabolic panel and blood pressure trend since patient's blood pressure is in the low normal. 2. A. fib with RVR - Patient is on Cardizem infusion. We'll try to wean off Cardizem once patient takes oral Cardizem and metoprolol. Coumadin per pharmacy. Check cardiac markers and TSH. Chads 2 vasc score is more than 2. 3. Status post transcatheter aortic valve replacement in September - cardiology has been consulted. 4. Diabetes mellitus type 2 - on sliding scale coverage. Hold metformin. 5. Hypothyroidism on Synthroid. Check TSH. 6. Hyperlipidemia on statins. 7. History of tachybradycardia syndrome status post pacemaker placement. 8. Leukocytosis - probably reactionary. No signs of infection at this time. Follow CBC.   DVT prophylaxis: Coumadin. Code Status: Full code.  Family Communication: Family at the bedside.  Disposition Plan: Home.  Consults called: Cardiology.  Admission status: Inpatient. Likely stay 2 days.    Eduard ClosKAKRAKANDY,ARSHAD N. MD [facility] Pager [phone].  If 7PM-7AM, please contact night-coverage [email or web address] Password [email or web address]  08/21/2016, 5:39 AM

## 2016-08-22 ENCOUNTER — Inpatient Hospital Stay (HOSPITAL_COMMUNITY): Payer: Medicare Other

## 2016-08-22 DIAGNOSIS — I5032 Chronic diastolic (congestive) heart failure: Secondary | ICD-10-CM

## 2016-08-22 DIAGNOSIS — E038 Other specified hypothyroidism: Secondary | ICD-10-CM

## 2016-08-22 DIAGNOSIS — E1165 Type 2 diabetes mellitus with hyperglycemia: Secondary | ICD-10-CM

## 2016-08-22 DIAGNOSIS — E118 Type 2 diabetes mellitus with unspecified complications: Secondary | ICD-10-CM

## 2016-08-22 LAB — GLUCOSE, CAPILLARY
GLUCOSE-CAPILLARY: 243 mg/dL — AB (ref 65–99)
Glucose-Capillary: 138 mg/dL — ABNORMAL HIGH (ref 65–99)
Glucose-Capillary: 258 mg/dL — ABNORMAL HIGH (ref 65–99)
Glucose-Capillary: 142 mg/dL — ABNORMAL HIGH (ref 65–99)

## 2016-08-22 LAB — CBC
HCT: 32.3 % — ABNORMAL LOW (ref 36.0–46.0)
HEMOGLOBIN: 10.4 g/dL — AB (ref 12.0–15.0)
MCH: 28.9 pg (ref 26.0–34.0)
MCHC: 32.2 g/dL (ref 30.0–36.0)
MCV: 89.7 fL (ref 78.0–100.0)
Platelets: 231 10*3/uL (ref 150–400)
RBC: 3.6 MIL/uL — ABNORMAL LOW (ref 3.87–5.11)
RDW: 14.7 % (ref 11.5–15.5)
WBC: 10.2 10*3/uL (ref 4.0–10.5)

## 2016-08-22 LAB — BASIC METABOLIC PANEL
Anion gap: 9 (ref 5–15)
BUN: 14 mg/dL (ref 6–20)
CALCIUM: 9.1 mg/dL (ref 8.9–10.3)
CHLORIDE: 101 mmol/L (ref 101–111)
CO2: 27 mmol/L (ref 22–32)
CREATININE: 0.68 mg/dL (ref 0.44–1.00)
GFR calc non Af Amer: >60 mL/min (ref >60)
Glucose, Bld: 148 mg/dL — ABNORMAL HIGH (ref 65–99)
Potassium: 4.4 mmol/L (ref 3.5–5.1)
SODIUM: 137 mmol/L (ref 135–145)

## 2016-08-22 LAB — PROTIME-INR
INR: 4.28 — AB
PROTHROMBIN TIME: 42.2 s — AB (ref 11.4–15.2)

## 2016-08-22 LAB — MAGNESIUM: MAGNESIUM: 2 mg/dL (ref 1.7–2.4)

## 2016-08-22 LAB — TROPONIN I: TROPONIN I: 0.03 ng/mL — AB (ref <0.03)

## 2016-08-22 MED ORDER — INSULIN ASPART 100 UNIT/ML ~~LOC~~ SOLN
0.0000 [IU] | SUBCUTANEOUS | Status: DC
  Administered 2016-08-22 – 2016-08-23 (×2): 2 [IU] via SUBCUTANEOUS
  Administered 2016-08-23: 5 [IU] via SUBCUTANEOUS
  Administered 2016-08-23: 2 [IU] via SUBCUTANEOUS
  Administered 2016-08-23: 3 [IU] via SUBCUTANEOUS

## 2016-08-22 MED ORDER — METOPROLOL TARTRATE 50 MG PO TABS
100.0000 mg | ORAL_TABLET | Freq: Two times a day (BID) | ORAL | Status: DC
  Administered 2016-08-22 – 2016-08-23 (×2): 100 mg via ORAL
  Filled 2016-08-22 (×2): qty 2

## 2016-08-22 MED ORDER — FUROSEMIDE 40 MG PO TABS
40.0000 mg | ORAL_TABLET | Freq: Every day | ORAL | Status: DC
  Administered 2016-08-23: 40 mg via ORAL
  Filled 2016-08-22: qty 1

## 2016-08-22 NOTE — Progress Notes (Signed)
@   1000 BP reading 70/29 ?, rechecked at 1023 109/65, HR 115. Did not given am metoprolol 150mg  and  lisinopril due to low BPs.  Pt up in chair during this time, no problems sitting up, no c/o dizziness. Will recheck BP and reassess for med, contact MD if needed.   @ 1100 BP 111/73, HR 119 pt still sitting in chair. Has walked with therapy between 1100-1130 now.

## 2016-08-22 NOTE — Progress Notes (Signed)
BP 115/84, HR 103...continuing to hold lisinopril and metoprolol.

## 2016-08-22 NOTE — Progress Notes (Addendum)
PROGRESS NOTE    [name]  NWG:[id number] DOB: [birth year] DOA: 08/26/2016 PCP: Lorenda PeckOBERTS, RONALD WAYNE, MD   Brief Narrative:  82 y.o. WF PMHx post TAVR in September this year, Tachybradycardia syndrome S/P Pacemaker placement, Chronic Atrial Fibrillation (chronic anticoagulation), DM type II uncontrolled with hypertension, Hypothyroidism, HLD  Presents to the ER because of persistent cough and shortness of breath. Patient states that symptoms started last evening around 8 PM. Patient states until then she was doing fine. Denies any chest pain. Has had some palpitations. Shortness of breath increases on lying flat. In the ER patient was found to be in A. fib with RVR and chest x-ray was showing bilateral effusion with congestion. Patient was started on Cardizem infusion. Cardiology was consulted and requested hospitalist admission. Patient denies missing her medications. Patient states she usually mows her yard.    Subjective: 11/21  A/O 4, NAD. States has been in atrial fibrillation since 1995 and on Coumadin.   Assessment & Plan:   Principal Problem:   Acute respiratory failure with hypoxia (HCC) Active Problems:   S/P TAVR (transcatheter aortic valve replacement)   Atrial fibrillation with RVR (HCC)   Acute diastolic CHF (congestive heart failure) (HCC)   CHF (congestive heart failure) (HCC)  Acute respiratory failure with hypoxia  -Most likely multifactorial to include A. fib RVR, decompensated CHF, and recently replaced aortic valve (September). -Currently NAD  Chronic Diastolic CHF -Cardiology consulted -Digoxin 0.125 mg daily -Lasix 40 mg daily -Metoprolol 100 mg BID -DC lisinopril (allow BP to increase in order to allow greater use of rate control agents) -Strict in and out -Daily a.m. weight Filed Weights   08/21/16 0451 08/22/16 0357  Weight: 68.7 kg (151 lb 7.3 oz) 68 kg (149 lb 14.6 oz)    A. Fib RVR -See CHF -Warfarin per pharmacy Recent Labs Lab  08/16/16 1210 08/08/2016 2344 08/22/16 0238  INR 3.7 4.31* 4.28*  -INR currently supratherapeutic, continue monitor closely no sign of active bleeding  S/P AV valve replacement -Cardiology consulted  Tachybradycardia syndrome -S/P pacemaker placement  Diabetes type 2 uncontrolled with complications  -9/28 Hemoglobin A1c= 7.1 -Increase moderate SSI -Lipid panel pending  Hypothyroidism -TSH WNL     DVT prophylaxis: Coumadin Code Status: Full Family Communication: None Disposition Plan: Therapeutic INR   Consultants:  Cardiology  Procedures/Significant Events:  Echocardiogram   VENTILATOR SETTINGS:    Cultures   Antimicrobials:    Devices    LINES / TUBES:      Continuous Infusions:   Objective: Vitals:   08/22/16 1230 08/22/16 1500 08/22/16 1700 08/22/16 2000  BP: 115/84 90/71 (!) 124/92 123/81  Pulse: (!) 109 (!) 114 (!) 103 (!) 111  Resp: (!) 22 (!) 21 (!) 25 (!) 22  Temp:  97.1 F (36.2 C)  97.3 F (36.3 C)  TempSrc:  Oral  Oral  SpO2: 98% 97% 98% 98%  Weight:      Height:        Intake/Output Summary (Last 24 hours) at 08/22/16 2053 Last data filed at 08/22/16 1730  Gross per 24 hour  Intake              795 ml  Output             2150 ml  Net            -1355 ml   Filed Weights   08/21/16 0451 08/22/16 0357  Weight: 68.7 kg (151 lb 7.3 oz) 68 kg (149 lb  14.6 oz)    Examination:  General: A/O 4, NAD, No acute respiratory distress Eyes: negative scleral hemorrhage, negative anisocoria, negative icterus ENT: Negative Runny nose, negative gingival bleeding, Neck:  Negative scars, masses, torticollis, lymphadenopathy, JVD Lungs: Clear to auscultation bilaterally without wheezes or crackles Cardiovascular: Irregular irregular rhythm and rate, negative murmurs rubs or gallops, normal S1 and S2 Abdomen: negative abdominal pain, nondistended, positive soft, bowel sounds, no rebound, no ascites, no appreciable mass Extremities: No  significant cyanosis, clubbing, or edema bilateral lower extremities Skin: Negative rashes, lesions, ulcers Psychiatric:  Negative depression, negative anxiety, negative fatigue, negative mania  Central nervous system:  Cranial nerves II through XII intact, tongue/uvula midline, all extremities muscle strength 5/5, sensation intact throughout,  negative dysarthria, negative expressive aphasia, negative receptive aphasia.  .     Data Reviewed: Care during the described time interval was provided by me .  I have reviewed this patient's available data, including medical history, events of note, physical examination, and all test results as part of my evaluation. I have personally reviewed and interpreted all radiology studies.  CBC:  Recent Labs Lab 08/31/2016 2355 08/21/16 0650 08/22/16 0238  WBC 16.5* 13.1* 10.2  NEUTROABS 14.2* 10.4*  --   HGB 11.8* 11.5* 10.4*  HCT 36.3 35.3* 32.3*  MCV 89.9 89.4 89.7  PLT 257 250 231   Basic Metabolic Panel:  Recent Labs Lab 08/03/2016 2344 08/21/16 0650 08/22/16 0238  NA 136 137 137  K 4.3 4.0 4.4  CL 102 102 101  CO2 23 22 27   GLUCOSE 201* 166* 148*  BUN 22* 16 14  CREATININE 0.86 0.70 0.68  CALCIUM 9.2 9.1 9.1  MG 2.0  --  2.0   GFR: Estimated Creatinine Clearance: 46.4 mL/min (by C-G formula based on SCr of 0.68 mg/dL). Liver Function Tests:  Recent Labs Lab 08/21/16 0650  AST 31  ALT 16  ALKPHOS 81  BILITOT 1.1  PROT 7.5  ALBUMIN 3.9   No results for input(s): LIPASE, AMYLASE in the last 168 hours. No results for input(s): AMMONIA in the last 168 hours. Coagulation Profile:  Recent Labs Lab 08/16/16 1210 08/05/2016 2344 08/22/16 0238  INR 3.7 4.31* 4.28*   Cardiac Enzymes:  Recent Labs Lab 08/04/2016 2344 08/21/16 0650 08/21/16 1131 08/21/16 1759 08/22/16 0238  TROPONINI <0.03 0.05* 0.06* 0.05* 0.03*   BNP (last 3 results) No results for input(s): PROBNP in the last 8760 hours. HbA1C: No results for  input(s): HGBA1C in the last 72 hours. CBG:  Recent Labs Lab 08/21/16 1601 08/21/16 2127 08/22/16 0749 08/22/16 1157 08/22/16 1556  GLUCAP 185* 152* 138* 258* 243*   Lipid Profile: No results for input(s): CHOL, HDL, LDLCALC, TRIG, CHOLHDL, LDLDIRECT in the last 72 hours. Thyroid Function Tests:  Recent Labs  08/21/16 0650  TSH 2.261   Anemia Panel: No results for input(s): VITAMINB12, FOLATE, FERRITIN, TIBC, IRON, RETICCTPCT in the last 72 hours. Urine analysis:    Component Value Date/Time   COLORURINE YELLOW 08/21/2016 0135   APPEARANCEUR CLEAR 08/21/2016 0135   LABSPEC 1.021 08/21/2016 0135   PHURINE 5.0 08/21/2016 0135   GLUCOSEU 100 (A) 08/21/2016 0135   HGBUR NEGATIVE 08/21/2016 0135   BILIRUBINUR NEGATIVE 08/21/2016 0135   KETONESUR 15 (A) 08/21/2016 0135   PROTEINUR NEGATIVE 08/21/2016 0135   NITRITE NEGATIVE 08/21/2016 0135   LEUKOCYTESUR SMALL (A) 08/21/2016 0135   Sepsis Labs: @LABRCNTIP (procalcitonin:4,lacticidven:4)  ) Recent Results (from the past 240 hour(s))  MRSA PCR Screening  Status: None   Collection Time: 08/21/16  4:58 AM  Result Value Ref Range Status   MRSA by PCR NEGATIVE NEGATIVE Final    Comment:        The GeneXpert MRSA Assay (FDA approved for NASAL specimens only), is one component of a comprehensive MRSA colonization surveillance program. It is not intended to diagnose MRSA infection nor to guide or monitor treatment for MRSA infections.          Radiology Studies: Dg Chest 2 View  Result Date: 08/21/2016 CLINICAL DATA:  Shortness of breath starting at 8 p.m. and gradually worsening. History of atrial fibrillation and hypertension. EXAM: CHEST  2 VIEW COMPARISON:  07/14/2016 FINDINGS: Postoperative changes in the mediastinum. Cardiac pacemaker. Mild cardiac enlargement. Increasing interstitial pattern to the lungs mostly in the periphery and lung bases, likely representing developing edema. Small bilateral pleural  effusions. No focal consolidation. No pneumothorax. Calcified and tortuous aorta. Degenerative changes in the spine and shoulders. IMPRESSION: Cardiac enlargement with increasing interstitial pattern to the lungs suggesting developing interstitial edema. Small bilateral pleural effusions. Electronically Signed   By: Burman Nieves M.D.   On: 08/21/2016 01:18   Dg Chest Port 1 View  Result Date: 08/22/2016 CLINICAL DATA:  Follow-up pleural effusions.  Subsequent encounter. EXAM: PORTABLE CHEST 1 VIEW COMPARISON:  Chest radiograph performed 08/21/2016 FINDINGS: A small left pleural effusion is again noted, similar in appearance to the prior study. The right-sided pleural effusion appears to have largely resolved. Vascular congestion is noted. Increased interstitial markings may reflect mild interstitial edema or possibly pneumonia. No pneumothorax is seen. The cardiomediastinal silhouette is borderline normal in size. A stent is noted overlying the heart. A pacemaker is noted overlying the left chest wall, with a lead ending overlying the right ventricle. No acute osseous abnormalities are seen. Degenerative change is noted at the right glenohumeral joint. IMPRESSION: Small left pleural effusion, similar in appearance to the prior study. Right-sided pleural effusion appears to have largely resolved. Vascular congestion noted. Increased interstitial markings may reflect mild interstitial edema or possibly pneumonia. Electronically Signed   By: Roanna Raider M.D.   On: 08/22/2016 03:34        Scheduled Meds: . calcium carbonate  1,250 mg Oral Once per day on Mon Wed Fri  . digoxin  0.125 mg Oral Daily  . diltiazem  240 mg Oral Daily  . [START ON 08/24/2016] furosemide  40 mg Oral Daily  . gabapentin  300 mg Oral q morning - 10a   And  . gabapentin  900 mg Oral QHS  . insulin aspart  0-9 Units Subcutaneous TID WC  . levothyroxine  50 mcg Oral QAC breakfast  . metoprolol tartrate  100 mg Oral BID    . potassium chloride SA  20 mEq Oral BID  . simvastatin  10 mg Oral q1800  . sodium chloride flush  3 mL Intravenous Q12H   Continuous Infusions:   LOS: 1 day    Time spent: 40 minutes    Raji Glinski, Roselind Messier, MD [facility] Pager [phone]   If 7PM-7AM, please contact night-coverage [email or web address] Password [facility] 08/22/2016, 8:53 PM

## 2016-08-22 NOTE — Progress Notes (Signed)
Notified Geoffry ParadiseLindsey Roberts, NP of pt's BP and HR today and holding of metoprolol and lisinopril due to low BP. Metoprolol dose ordered for 150mg  which was increased yesterday from 100mg .  NP to set parameters for metoprolol and lisinopril.

## 2016-08-22 NOTE — Evaluation (Signed)
Physical Therapy Evaluation Patient Details Name: [name] MRN: [id number] DOB: November 07, 1926 Today's Date: 08/22/2016   History of Present Illness  82 year old female with a past medical history of TAVR (23mm Stephani Policedward Sapien 3 valve) on 07/04/2016, chronic diastolic CHF, DM, HLD, HTN, and  tachy-brady syndrome s/p SJM single chamber PPM. She presented to the ED on 08/02/2016 with SOB and rapid Afib  Clinical Impression  Patient demonstrates deficits in functional mobility as indicated below. Will need continued skilled PT to address deficits and maximize function. Will see as indicated and progress as tolerated.   OF NOTE: HR remained 110s-120s during mobility, Bp assessed prior to activity 90s/70s then after activity 110s/70s. Denies dizziness.    Follow Up Recommendations Supervision - Intermittent;No PT follow up    Equipment Recommendations  None recommended by PT    Recommendations for Other Services       Precautions / Restrictions Precautions Precautions: Fall Restrictions Weight Bearing Restrictions: No      Mobility  Bed Mobility               General bed mobility comments: received in chair  Transfers Overall transfer level: Needs assistance Equipment used: None Transfers: Sit to/from Stand Sit to Stand: Min guard         General transfer comment: min guard for stability  Ambulation/Gait Ambulation/Gait assistance: Min guard Ambulation Distance (Feet): 340 Feet Assistive device: None Gait Pattern/deviations: Step-through pattern;Decreased stride length;Drifts right/left Gait velocity: decreased Gait velocity interpretation: Below normal speed for age/gender General Gait Details: some instability with ambulation noted, improved with activity. Patient with 2 LOB but able to self correct using hallway rail  Stairs            Wheelchair Mobility    Modified Rankin (Stroke Patients Only)       Balance Overall balance assessment: Needs  assistance   Sitting balance-Leahy Scale: Good     Standing balance support: During functional activity Standing balance-Leahy Scale: Fair Standing balance comment: some instability noted in static standing, increased sway, no significant LOB in static positioning                             Pertinent Vitals/Pain Pain Assessment: No/denies pain    Home Living Family/patient expects to be discharged to:: Private residence Living Arrangements: Alone Available Help at Discharge: Friend(s);Available PRN/intermittently Type of Home: House Home Access: Stairs to enter   Entergy CorporationEntrance Stairs-Number of Steps: 1 Home Layout: One level Home Equipment: Cane - single point;Grab bars - tub/shower      Prior Function Level of Independence: Independent         Comments: Active.  Works in yard, goes      Occupational psychologistHand Dominance   Dominant Hand: Right    Extremity/Trunk Assessment   Upper Extremity Assessment: Generalized weakness           Lower Extremity Assessment: Generalized weakness         Communication   Communication: No difficulties  Cognition Arousal/Alertness: Awake/alert Behavior During Therapy: WFL for tasks assessed/performed Overall Cognitive Status: Within Functional Limits for tasks assessed                      General Comments      Exercises     Assessment/Plan    PT Assessment Patient needs continued PT services  PT Problem List Decreased strength;Decreased activity tolerance;Decreased balance;Decreased mobility;Cardiopulmonary status limiting activity  PT Treatment Interventions DME instruction;Gait training;Stair training;Functional mobility training;Therapeutic activities;Therapeutic exercise;Balance training;Patient/family education    PT Goals (Current goals can be found in the Care Plan section)  Acute Rehab PT Goals Patient Stated Goal: to get back home PT Goal Formulation: With patient Time For Goal Achievement:  09/05/16 Potential to Achieve Goals: Good    Frequency Min 3X/week   Barriers to discharge        Co-evaluation               End of Session Equipment Utilized During Treatment: Gait belt Activity Tolerance: Patient tolerated treatment well Patient left: in chair;with call bell/phone within reach Nurse Communication: Mobility status         Time: [id number] PT Time Calculation (min) (ACUTE ONLY): 21 min   Charges:   PT Evaluation $PT Eval Moderate Complexity: 1 Procedure     PT G Codes:        Fabio AsaDevon J Dejanique Ruehl 08/22/2016, 1:38 PM Charlotte Crumbevon Debborah Alonge, PT DPT  [phone]

## 2016-08-22 NOTE — Progress Notes (Signed)
BP at 1400 108/49, HR 98 --continued to hold on metoprolol and lisinopril.  Ambulated pt to bathroom at 1445, HR in 100-110's only, early am HR would go up 120's + with any movement.

## 2016-08-22 NOTE — Progress Notes (Signed)
Patient Name: Janet BostonMillie H Fessel Date of Encounter: 08/22/2016  Principal Problem:   Acute respiratory failure with hypoxia (HCC) Active Problems:   S/P TAVR (transcatheter aortic valve replacement)   Atrial fibrillation with RVR (HCC)   Acute diastolic CHF (congestive heart failure) (HCC)   CHF (congestive heart failure) (HCC)   Length of Stay: 1  SUBJECTIVE  She feels well this am, no SOB.  CURRENT MEDS . calcium carbonate  1,250 mg Oral Once per day on Mon Wed Fri  . digoxin  0.125 mg Oral Daily  . diltiazem  240 mg Oral Daily  . furosemide  40 mg Intravenous Q12H  . gabapentin  300 mg Oral q morning - 10a   And  . gabapentin  900 mg Oral QHS  . insulin aspart  0-9 Units Subcutaneous TID WC  . levothyroxine  50 mcg Oral QAC breakfast  . lisinopril  2.5 mg Oral Daily  . metoprolol  150 mg Oral BID  . potassium chloride SA  20 mEq Oral BID  . simvastatin  10 mg Oral q1800  . sodium chloride flush  3 mL Intravenous Q12H    OBJECTIVE  Vitals:   08/22/16 0039 08/22/16 0357 08/22/16 0400 08/22/16 0749  BP: (!) 104/54 103/62 (!) 113/54 94/64  Pulse: 67 82 78 (!) 115  Resp: 16 17 17  (!) 27  Temp: 97.6 F (36.4 C) 97.7 F (36.5 C)  97.5 F (36.4 C)  TempSrc: Oral Oral  Oral  SpO2: 93% 94% 93% 97%  Weight:  149 lb 14.6 oz (68 kg)    Height:        Intake/Output Summary (Last 24 hours) at 08/22/16 0925 Last data filed at 08/22/16 0730  Gross per 24 hour  Intake              262 ml  Output             2550 ml  Net            -2288 ml   Filed Weights   08/21/16 0451 08/22/16 0357  Weight: 151 lb 7.3 oz (68.7 kg) 149 lb 14.6 oz (68 kg)    PHYSICAL EXAM  General: Pleasant, NAD. Neuro: Alert and oriented X 3. Moves all extremities spontaneously. Psych: Normal affect. HEENT:  Normal  Neck: Supple without bruits or JVD. Lungs:  Resp regular and unlabored, CTA. Heart: RRR no s3, s4, or murmurs. Abdomen: Soft, non-tender, non-distended, BS + x 4.    Extremities: No clubbing, cyanosis or edema. DP/PT/Radials 2+ and equal bilaterally.  Accessory Clinical Findings  CBC  Recent Labs  November 09, 2015 2355 08/21/16 0650 08/22/16 0238  WBC 16.5* 13.1* 10.2  NEUTROABS 14.2* 10.4*  --   HGB 11.8* 11.5* 10.4*  HCT 36.3 35.3* 32.3*  MCV 89.9 89.4 89.7  PLT 257 250 231   Basic Metabolic Panel  Recent Labs  November 09, 2015 2344 08/21/16 0650 08/22/16 0238  NA 136 137 137  K 4.3 4.0 4.4  CL 102 102 101  CO2 23 22 27   GLUCOSE 201* 166* 148*  BUN 22* 16 14  CREATININE 0.86 0.70 0.68  CALCIUM 9.2 9.1 9.1  MG 2.0  --  2.0   Liver Function Tests  Recent Labs  08/21/16 0650  AST 31  ALT 16  ALKPHOS 81  BILITOT 1.1  PROT 7.5  ALBUMIN 3.9   No results for input(s): LIPASE, AMYLASE in the last 72 hours. Cardiac Enzymes  Recent Labs  08/21/16  1131 08/21/16 1759 08/22/16 0238  TROPONINI 0.06* 0.05* 0.03*    Recent Labs  08/21/16 0650  TSH 2.261    Radiology/Studies  Dg Chest 2 View  Result Date: 08/21/2016 CLINICAL DATA:  Shortness of breath starting at 8 p.m. and gradually worsening. History of atrial fibrillation and hypertension. EXAM: CHEST  2 VIEW COMPARISON:  07/14/2016 FINDINGS: Postoperative changes in the mediastinum. Cardiac pacemaker. Mild cardiac enlargement. Increasing interstitial pattern to the lungs mostly in the periphery and lung bases, likely representing developing edema. Small bilateral pleural effusions. No focal consolidation. No pneumothorax. Calcified and tortuous aorta. Degenerative changes in the spine and shoulders. IMPRESSION: Cardiac enlargement with increasing interstitial pattern to the lungs suggesting developing interstitial edema. Small bilateral pleural effusions. Electronically Signed   By: Burman NievesWilliam  Stevens M.D.   On: 08/21/2016 01:18   Dg Chest Port 1 View  Result Date: 08/22/2016 CLINICAL DATA:  Follow-up pleural effusions.  Subsequent encounter. EXAM: PORTABLE CHEST 1 VIEW COMPARISON:   Chest radiograph performed 08/21/2016 FINDINGS: A small left pleural effusion is again noted, similar in appearance to the prior study. The right-sided pleural effusion appears to have largely resolved. Vascular congestion is noted. Increased interstitial markings may reflect mild interstitial edema or possibly pneumonia. No pneumothorax is seen. The cardiomediastinal silhouette is borderline normal in size. A stent is noted overlying the heart. A pacemaker is noted overlying the left chest wall, with a lead ending overlying the right ventricle. No acute osseous abnormalities are seen. Degenerative change is noted at the right glenohumeral joint. IMPRESSION: Small left pleural effusion, similar in appearance to the prior study. Right-sided pleural effusion appears to have largely resolved. Vascular congestion noted. Increased interstitial markings may reflect mild interstitial edema or possibly pneumonia. Electronically Signed   By: Roanna RaiderJeffery  Chang M.D.   On: 08/22/2016 03:34   TELE: a-fib, in 2770' overnight , up to 110 this am    ASSESSMENT AND PLAN  82 year old female with a past medical history of TAVR (23mm Stephani Policedward Sapien 3 valve) on 07/04/2016, chronic diastolic CHF, DM, HLD, HTN, and  tachy-brady syndrome s/p SJM single chamber PPM, now in persistent a-fib. LVEF 55%. She presented to the ED on 08/26/2016 with SOB and rapid Afib associated with SOB. She was started on iv bilateral pleural effusions. She was started on IV diltiazem that slowed down her HR from 140-->70 with improvement of symptoms. Now back on PO diltiazem and metoprolol.  1. Atrial fibrillation with RVR: Now rate controlled on po diltiazem. Continue digoxin,metoprolol 150mg  BID. HR up this am, she might be too dry now. This patients CHA2DS2-VASc Score and unadjusted Ischemic Stroke Rate (% per year) is equal to 7.2 % stroke rate/year from a score of 5 Above score calculated as 1 point each if present [CHF, HTN, DM, Vascular=MI/PAD/Aortic  Plaque, Age if 65-74, or Female], 2 points each if present [Age > 75, or Stroke/TIA/TE] She has a bump on the forehead from a mechanical fall, she states that this was unusual, she doesn't fall otherwise.  2. Chronic diastolic CHF: Exacerbated by Afib RVR. We will hold lasix this am and resume lasix 40 mg po daily tomorrow. Plan for a discharge tomorrow.   3. DM2: Management per primary team  4. HLD: On statin, continue same.    Signed, Tobias AlexanderKatarina Evaristo Tsuda MD, [facility] 08/22/2016

## 2016-08-22 NOTE — Progress Notes (Signed)
ANTICOAGULATION CONSULT NOTE   Pharmacy Consult for warfarin Indication: atrial fibrillation  Allergies  Allergen Reactions  . Amiodarone Other (See Comments)    Stops her heart  . Aspirin Other (See Comments)    Gi bleeding  . Codeine Other (See Comments)    GI Lead  . Tramadol Nausea And Vomiting  . Lipitor [Atorvastatin Calcium] Other (See Comments)    Muscle Ache  . Pravachol Other (See Comments)    Muscle Ache    Patient Measurements: Height: 5\' 6"  (167.6 cm) Weight: 149 lb 14.6 oz (68 kg) IBW/kg (Calculated) : 59.3  Vital Signs: Temp: 97.5 F (36.4 C) (11/21 0749) Temp Source: Oral (11/21 0749) BP: 94/64 (11/21 0749) Pulse Rate: 115 (11/21 0749)  Labs:  Recent Labs  08/09/2016 2344  08/03/2016 2355 08/21/16 0650 08/21/16 1131 08/21/16 1759 08/22/16 0238  HGB  --   < > 11.8* 11.5*  --   --  10.4*  HCT  --   --  36.3 35.3*  --   --  32.3*  PLT  --   --  257 250  --   --  231  APTT 55*  --   --   --   --   --   --   LABPROT 41.3*  --   --   --   --   --  42.2*  INR 4.31*  --   --   --   --   --  4.28*  CREATININE 0.86  --   --  0.70  --   --  0.68  TROPONINI <0.03  --   --  0.05* 0.06* 0.05* 0.03*  < > = values in this interval not displayed.  Estimated Creatinine Clearance: 46.4 mL/min (by C-G formula based on SCr of 0.68 mg/dL).   Medical History: Past Medical History:  Diagnosis Date  . Arthritis   . Chronic diastolic congestive heart failure (HCC)   . Diabetes mellitus   . History of peptic ulcer disease   . Hypercholesteremia   . Hyperlipidemia   . Hypertension   . Hypothyroidism   . Peripheral neuropathy (HCC)   . Permanent atrial fibrillation (HCC)   . Presence of permanent cardiac pacemaker   . 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 Severe aortic stenosis   . Tachycardia-bradycardia syndrome (HCC)    a. s/p SJM  single chamber PPM implanted by Dr Reyes IvanKersey 2009  . Thrombocytopenia (HCC)   . Thyroid disease     Assessment: [name] with chronic atrial fibrillation on warfarin PTA.  INR this morning is down to 4.28 and CBC remains stable.  PTA dose of warfarin is 2.5 mg daily except 1.25 mg on Wednesday. Will continue to hold until closer to therapeutic range of INR 2-3.    Goal of Therapy:  INR 2-3 Monitor platelets by anticoagulation protocol: Yes   Plan:  -Hold warfarin for today  -Daily INR  -CBC daily   Bailey MechEmily Stewart, PharmD PGY1 Pharmacy Resident 08/22/2016, 8:44 AM Pager: [phone]

## 2016-08-23 ENCOUNTER — Inpatient Hospital Stay (HOSPITAL_COMMUNITY): Payer: Medicare Other

## 2016-08-23 ENCOUNTER — Emergency Department (HOSPITAL_COMMUNITY): Payer: Medicare Other

## 2016-08-23 ENCOUNTER — Telehealth: Payer: Self-pay | Admitting: Pharmacist

## 2016-08-23 ENCOUNTER — Inpatient Hospital Stay (HOSPITAL_COMMUNITY)
Admission: EM | Admit: 2016-08-23 | Discharge: 2016-09-01 | Disposition: E | Payer: Medicare Other | Source: Home / Self Care | Attending: Pulmonary Disease | Admitting: Pulmonary Disease

## 2016-08-23 ENCOUNTER — Encounter (HOSPITAL_COMMUNITY): Payer: Self-pay | Admitting: Emergency Medicine

## 2016-08-23 ENCOUNTER — Telehealth: Payer: Self-pay | Admitting: *Deleted

## 2016-08-23 DIAGNOSIS — G9340 Encephalopathy, unspecified: Secondary | ICD-10-CM

## 2016-08-23 DIAGNOSIS — I469 Cardiac arrest, cause unspecified: Secondary | ICD-10-CM

## 2016-08-23 DIAGNOSIS — R570 Cardiogenic shock: Secondary | ICD-10-CM | POA: Diagnosis not present

## 2016-08-23 DIAGNOSIS — Z952 Presence of prosthetic heart valve: Secondary | ICD-10-CM

## 2016-08-23 DIAGNOSIS — E038 Other specified hypothyroidism: Secondary | ICD-10-CM | POA: Diagnosis present

## 2016-08-23 DIAGNOSIS — E118 Type 2 diabetes mellitus with unspecified complications: Secondary | ICD-10-CM

## 2016-08-23 DIAGNOSIS — IMO0002 Reserved for concepts with insufficient information to code with codable children: Secondary | ICD-10-CM | POA: Diagnosis present

## 2016-08-23 DIAGNOSIS — I4891 Unspecified atrial fibrillation: Secondary | ICD-10-CM | POA: Diagnosis present

## 2016-08-23 DIAGNOSIS — Z452 Encounter for adjustment and management of vascular access device: Secondary | ICD-10-CM

## 2016-08-23 DIAGNOSIS — E872 Acidosis, unspecified: Secondary | ICD-10-CM

## 2016-08-23 DIAGNOSIS — J810 Acute pulmonary edema: Secondary | ICD-10-CM

## 2016-08-23 DIAGNOSIS — S270XXA Traumatic pneumothorax, initial encounter: Secondary | ICD-10-CM

## 2016-08-23 DIAGNOSIS — E1165 Type 2 diabetes mellitus with hyperglycemia: Secondary | ICD-10-CM | POA: Diagnosis present

## 2016-08-23 DIAGNOSIS — E875 Hyperkalemia: Secondary | ICD-10-CM

## 2016-08-23 DIAGNOSIS — Z7901 Long term (current) use of anticoagulants: Secondary | ICD-10-CM

## 2016-08-23 DIAGNOSIS — J9600 Acute respiratory failure, unspecified whether with hypoxia or hypercapnia: Secondary | ICD-10-CM

## 2016-08-23 DIAGNOSIS — I4901 Ventricular fibrillation: Secondary | ICD-10-CM

## 2016-08-23 DIAGNOSIS — I5032 Chronic diastolic (congestive) heart failure: Secondary | ICD-10-CM

## 2016-08-23 DIAGNOSIS — J939 Pneumothorax, unspecified: Secondary | ICD-10-CM | POA: Diagnosis present

## 2016-08-23 DIAGNOSIS — J9601 Acute respiratory failure with hypoxia: Secondary | ICD-10-CM | POA: Diagnosis present

## 2016-08-23 LAB — I-STAT CHEM 8, ED
BUN: 25 mg/dL — ABNORMAL HIGH (ref 6–20)
CALCIUM ION: 1.07 mmol/L — AB (ref 1.15–1.40)
Chloride: 102 mmol/L (ref 101–111)
Creatinine, Ser: 1 mg/dL (ref 0.44–1.00)
Glucose, Bld: 293 mg/dL — ABNORMAL HIGH (ref 65–99)
HEMATOCRIT: 35 % — AB (ref 36.0–46.0)
HEMOGLOBIN: 11.9 g/dL — AB (ref 12.0–15.0)
Potassium: 5 mmol/L (ref 3.5–5.1)
SODIUM: 136 mmol/L (ref 135–145)
TCO2: 20 mmol/L (ref 0–100)

## 2016-08-23 LAB — COMPREHENSIVE METABOLIC PANEL
ALT: 29 U/L (ref 14–54)
AST: 72 U/L — AB (ref 15–41)
Albumin: 2.4 g/dL — ABNORMAL LOW (ref 3.5–5.0)
Alkaline Phosphatase: 79 U/L (ref 38–126)
Anion gap: 10 (ref 5–15)
BUN: 16 mg/dL (ref 6–20)
CHLORIDE: 113 mmol/L — AB (ref 101–111)
CO2: 15 mmol/L — AB (ref 22–32)
CREATININE: 0.82 mg/dL (ref 0.44–1.00)
Calcium: 6.2 mg/dL — CL (ref 8.9–10.3)
Glucose, Bld: 238 mg/dL — ABNORMAL HIGH (ref 65–99)
POTASSIUM: 3.7 mmol/L (ref 3.5–5.1)
SODIUM: 138 mmol/L (ref 135–145)
Total Bilirubin: 0.3 mg/dL (ref 0.3–1.2)
Total Protein: 4.6 g/dL — ABNORMAL LOW (ref 6.5–8.1)

## 2016-08-23 LAB — CBC WITH DIFFERENTIAL/PLATELET
BASOS ABS: 0 10*3/uL (ref 0.0–0.1)
Basophils Relative: 0 %
EOS ABS: 0.3 10*3/uL (ref 0.0–0.7)
EOS PCT: 2 %
HCT: 28.4 % — ABNORMAL LOW (ref 36.0–46.0)
Hemoglobin: 9.1 g/dL — ABNORMAL LOW (ref 12.0–15.0)
Lymphocytes Relative: 27 %
Lymphs Abs: 3.8 10*3/uL (ref 0.7–4.0)
MCH: 29.6 pg (ref 26.0–34.0)
MCHC: 32 g/dL (ref 30.0–36.0)
MCV: 92.5 fL (ref 78.0–100.0)
MONO ABS: 0.8 10*3/uL (ref 0.1–1.0)
Monocytes Relative: 6 %
Neutro Abs: 9.2 10*3/uL — ABNORMAL HIGH (ref 1.7–7.7)
Neutrophils Relative %: 65 %
PLATELETS: 217 10*3/uL (ref 150–400)
RBC: 3.07 MIL/uL — AB (ref 3.87–5.11)
RDW: 14.8 % (ref 11.5–15.5)
WBC: 14.3 10*3/uL — AB (ref 4.0–10.5)

## 2016-08-23 LAB — LIPID PANEL
CHOLESTEROL: 132 mg/dL (ref 0–200)
HDL: 36 mg/dL — AB (ref >40)
LDL Cholesterol: 78 mg/dL (ref 0–99)
TRIGLYCERIDES: 89 mg/dL (ref <150)
Total CHOL/HDL Ratio: 3.7 RATIO
VLDL: 18 mg/dL (ref 0–40)

## 2016-08-23 LAB — I-STAT ARTERIAL BLOOD GAS, ED
Acid-base deficit: 6 mmol/L — ABNORMAL HIGH (ref 0.0–2.0)
Bicarbonate: 20.1 mmol/L (ref 20.0–28.0)
O2 Saturation: 99 %
PCO2 ART: 43.3 mmHg (ref 32.0–48.0)
TCO2: 21 mmol/L (ref 0–100)
pH, Arterial: 7.276 — ABNORMAL LOW (ref 7.350–7.450)
pO2, Arterial: 148 mmHg — ABNORMAL HIGH (ref 83.0–108.0)

## 2016-08-23 LAB — URINE MICROSCOPIC-ADD ON

## 2016-08-23 LAB — PROTIME-INR
INR: 3.01
PROTHROMBIN TIME: 31.9 s — AB (ref 11.4–15.2)

## 2016-08-23 LAB — BASIC METABOLIC PANEL
ANION GAP: 9 (ref 5–15)
BUN: 14 mg/dL (ref 6–20)
CHLORIDE: 102 mmol/L (ref 101–111)
CO2: 26 mmol/L (ref 22–32)
Calcium: 9 mg/dL (ref 8.9–10.3)
Creatinine, Ser: 0.77 mg/dL (ref 0.44–1.00)
GFR calc Af Amer: >60 mL/min (ref >60)
GLUCOSE: 162 mg/dL — AB (ref 65–99)
POTASSIUM: 4.4 mmol/L (ref 3.5–5.1)
SODIUM: 137 mmol/L (ref 135–145)

## 2016-08-23 LAB — URINALYSIS, ROUTINE W REFLEX MICROSCOPIC
Bilirubin Urine: NEGATIVE
KETONES UR: 15 mg/dL — AB
LEUKOCYTES UA: NEGATIVE
NITRITE: NEGATIVE
PH: 5.5 (ref 5.0–8.0)
Protein, ur: 100 mg/dL — AB
SPECIFIC GRAVITY, URINE: 1.021 (ref 1.005–1.030)

## 2016-08-23 LAB — GLUCOSE, CAPILLARY
GLUCOSE-CAPILLARY: 129 mg/dL — AB (ref 65–99)
GLUCOSE-CAPILLARY: 222 mg/dL — AB (ref 65–99)
Glucose-Capillary: 156 mg/dL — ABNORMAL HIGH (ref 65–99)

## 2016-08-23 LAB — BRAIN NATRIURETIC PEPTIDE: B NATRIURETIC PEPTIDE 5: 328.8 pg/mL — AB (ref 0.0–100.0)

## 2016-08-23 LAB — MAGNESIUM
MAGNESIUM: 1.7 mg/dL (ref 1.7–2.4)
Magnesium: 2 mg/dL (ref 1.7–2.4)

## 2016-08-23 LAB — I-STAT TROPONIN, ED: TROPONIN I, POC: 0.04 ng/mL (ref 0.00–0.08)

## 2016-08-23 LAB — I-STAT CG4 LACTIC ACID, ED
LACTIC ACID, VENOUS: 5.71 mmol/L — AB (ref 0.5–1.9)
Lactic Acid, Venous: 4.48 mmol/L (ref 0.5–1.9)

## 2016-08-23 MED ORDER — NOREPINEPHRINE BITARTRATE 1 MG/ML IV SOLN
0.0000 ug/min | INTRAVENOUS | Status: DC
  Administered 2016-08-24: 22 ug/min via INTRAVENOUS
  Administered 2016-08-24: 15 ug/min via INTRAVENOUS
  Filled 2016-08-23 (×3): qty 4

## 2016-08-23 MED ORDER — FAMOTIDINE IN NACL 20-0.9 MG/50ML-% IV SOLN
20.0000 mg | Freq: Two times a day (BID) | INTRAVENOUS | Status: DC
  Administered 2016-08-24 – 2016-08-25 (×4): 20 mg via INTRAVENOUS
  Filled 2016-08-23 (×5): qty 50

## 2016-08-23 MED ORDER — CISATRACURIUM BESYLATE (PF) 200 MG/20ML IV SOLN
1.0000 ug/kg/min | INTRAVENOUS | Status: DC
  Administered 2016-08-24: 1 ug/kg/min via INTRAVENOUS
  Filled 2016-08-23 (×2): qty 20

## 2016-08-23 MED ORDER — LIDOCAINE HCL (PF) 1 % IJ SOLN
INTRAMUSCULAR | Status: AC
  Filled 2016-08-23: qty 30

## 2016-08-23 MED ORDER — DEXTROSE 5 % IV SOLN: 2.0000 g | Freq: Once | INTRAVENOUS | Status: DC

## 2016-08-23 MED ORDER — FENTANYL BOLUS VIA INFUSION
25.0000 ug | INTRAVENOUS | Status: DC | PRN
  Filled 2016-08-23: qty 25

## 2016-08-23 MED ORDER — INSULIN ASPART 100 UNIT/ML ~~LOC~~ SOLN: 2.0000 [IU] | SUBCUTANEOUS | Status: DC

## 2016-08-23 MED ORDER — FENTANYL CITRATE (PF) 100 MCG/2ML IJ SOLN: 50.0000 ug | Freq: Once | INTRAMUSCULAR | Status: DC

## 2016-08-23 MED ORDER — ARTIFICIAL TEARS OP OINT
1.0000 "application " | TOPICAL_OINTMENT | Freq: Three times a day (TID) | OPHTHALMIC | Status: DC
  Administered 2016-08-24 – 2016-08-26 (×6): 1 via OPHTHALMIC
  Filled 2016-08-23 (×3): qty 3.5

## 2016-08-23 MED ORDER — WARFARIN SODIUM 2.5 MG PO TABS: 1.2500 mg | ORAL_TABLET | Freq: Once | ORAL | Status: DC

## 2016-08-23 MED ORDER — LIDOCAINE HCL (PF) 1 % IJ SOLN
30.0000 mL | Freq: Once | INTRAMUSCULAR | Status: AC
  Administered 2016-08-23: 30 mL

## 2016-08-23 MED ORDER — VANCOMYCIN HCL IN DEXTROSE 1-5 GM/200ML-% IV SOLN: 1000.0000 mg | Freq: Once | INTRAVENOUS | Status: DC

## 2016-08-23 MED ORDER — SODIUM CHLORIDE 0.9 % IV SOLN
1.0000 g | Freq: Once | INTRAVENOUS | Status: AC
  Administered 2016-08-24: 1 g via INTRAVENOUS
  Filled 2016-08-23: qty 10

## 2016-08-23 MED ORDER — PROPOFOL 1000 MG/100ML IV EMUL
25.0000 ug/kg/min | INTRAVENOUS | Status: DC
  Administered 2016-08-24 (×3): 35 ug/kg/min via INTRAVENOUS
  Administered 2016-08-24: 40 ug/kg/min via INTRAVENOUS
  Administered 2016-08-25: 40.11 ug/kg/min via INTRAVENOUS
  Filled 2016-08-23 (×7): qty 100

## 2016-08-23 MED ORDER — SODIUM CHLORIDE 0.9 % IV BOLUS (SEPSIS): 1000.0000 mL | Freq: Once | INTRAVENOUS | Status: DC

## 2016-08-23 MED ORDER — CISATRACURIUM BOLUS VIA INFUSION
0.0500 mg/kg | INTRAVENOUS | Status: DC | PRN
  Filled 2016-08-23: qty 4

## 2016-08-23 MED ORDER — CISATRACURIUM BOLUS VIA INFUSION
0.1000 mg/kg | Freq: Once | INTRAVENOUS | Status: DC
  Filled 2016-08-23: qty 7

## 2016-08-23 MED ORDER — SODIUM CHLORIDE 0.9 % IV SOLN: INTRAVENOUS | Status: DC | PRN

## 2016-08-23 MED ORDER — SODIUM CHLORIDE 0.9 % IV SOLN: INTRAVENOUS | Status: DC

## 2016-08-23 MED ORDER — WARFARIN - PHARMACIST DOSING INPATIENT: Freq: Every day | Status: DC

## 2016-08-23 MED ORDER — FENTANYL 2500MCG IN NS 250ML (10MCG/ML) PREMIX INFUSION
100.0000 ug/h | INTRAVENOUS | Status: DC
  Administered 2016-08-24 – 2016-08-25 (×4): 250 ug/h via INTRAVENOUS
  Filled 2016-08-23 (×4): qty 250

## 2016-08-23 MED ORDER — MIDAZOLAM HCL 2 MG/2ML IJ SOLN
2.0000 mg | Freq: Once | INTRAMUSCULAR | Status: AC
  Administered 2016-08-23: 2 mg via INTRAVENOUS
  Filled 2016-08-23: qty 2

## 2016-08-23 NOTE — ED Provider Notes (Signed)
CHEST TUBE INSERTION Date/Time: 08/07/2016 11:18 PM Performed by: Myrtis SerKATZ, Ginevra Tacker Authorized by: Myrtis SerKATZ, Brodyn Depuy   Consent:    Consent obtained:  Emergent situation Pre-procedure details:    Skin preparation:  Betadine and ChloraPrep Sedation:    Sedation type:  Moderate (conscious) sedation Anesthesia (see MAR for exact dosages):    Anesthesia method:  Local infiltration   Local anesthetic:  Lidocaine 1% w/o epi Procedure details:    Placement location:  R lateral   Scalpel size:  11   Tube size (Fr):  28   Dissection instrument:  Finger and Kelly clamp   Tube connected to:  Suction   Drainage characteristics:  Bloody   Suture material:  0 silk   Dressing:  4x4 sterile gauze and petrolatum-impregnated gauze Post-procedure details:    Post-insertion x-ray findings: tube in good position     Patient tolerance of procedure:  Tolerated well, no immediate complications       Cherlynn PerchesEric Aleksi Brummet, MD 08/24/16 [id number]    Alvira MondayErin Schlossman, MD 08/25/16 1443

## 2016-08-23 NOTE — Discharge Summary (Signed)
DISCHARGE SUMMARY  [name]  MR#: [id number]  DOB:05-Jan-1927  Date of Admission: 15-Sep-2016 Date of Discharge: 08/15/2016  Attending Physician:Azelia Reiger T  Patient's DGU:[id number], Janet Ammons, MD  Consults: [facility]  Disposition: D/C home   Follow-up Appts: Follow-up Information    [name], MD Follow up.   Specialty:  Cardiology Why:  The office will call you to arrange for a follow-up appointment.    Contact information: [address] [place] [address] [phone]        [facility] Follow up.   Why:  Contact your coumadin clinic for an INR check on Friday 08/25/16 - THIS IS VERY IMPORTANT           Tests Needing Follow-up: -recheck of INR in 2-3 days is suggested - pt reports to MD that she is followed at the [facility] and can arrange for a f/u w/o problem  -f/u of K+ level and renal function is suggested  -monitoring of volume status and rate control in setting of Afib is suggested   Discharge Diagnoses: Acute respiratory failure with hypoxia - Pulmonary Edema  Chronic A. fib with acute RVR Status post transcatheter aortic valve replacement September 2017 DM2 Hypothyroidism Hyperlipidemia History of tachybradycardia syndrome  Initial presentation: 82 y.o.femalewith history of TAVRin September 2017, tachy-brady syndrome status post pacemaker placement, chronic atrial fibrillation, diabetes mellitus, hypothyroidism and hyperlipidemia who presented to the ER w/ persistent cough and shortness of breath for ~24hrs.  In the ER patient was found to be in A. fib with RVR and chest x-ray noted bilateral effusions with congestion. Patient was started on Cardizem infusion. Cardiology was consulted and requested Hospitalist admission.   Hospital Course:  Acute respiratory failure with hypoxia - Pulmonary Edema  Resolved - net negative ~3600cc since admit - baseline wgt as of Oct  2017 ~65kg - TTE 08/03/16 noted EF 55% w/ no WMA but indeterminate diastolic fxn - suspect pulmonary edema due to diminished CO related to RVR - stable on RA w/ HR of 74 at time of d/c - cleared for d/c by Cards  Filed Weights   08/21/16 0451 08/22/16 0357 08/17/2016 0500  Weight: 68.7 kg (151 lb 7.3 oz) 68 kg (149 lb 14.6 oz) 66.9 kg (147 lb 7.8 oz)    Chronic A. fib with acute RVR Coumadin per pharmacy - CHA2DS2 - VASc is 2+ - rate now well controlled - titrated off cardizem gtt - discussed w/ Pharmacy who suggested resuming coumadin tonight - advised pt INR must be checked on 11/24 - she reports she can arrange this at her usual coumadin clinic w/ [facility]   Status post transcatheter aortic valve replacement September 2017 Cardiology consulted in the ED but refused to admit - followed as consult th/o stay  DM2 Reasonably well controlled during hospital stay - no change in tx plan at time of d/c   Hypothyroidism on Synthroid  Hyperlipidemia on statin  History of tachybradycardia syndrome status post pacemaker placement    Medication List    TAKE these medications   amoxicillin 500 MG capsule Commonly known as:  AMOXIL Take 500 mg by mouth daily as needed (for dental appointments).   calcium carbonate 600 MG Tabs tablet Commonly known as:  OS-CAL Take 1,200 mg by mouth 3 (three) times a week.   digoxin 0.125 MG tablet Commonly known as:  LANOXIN Take 1 tablet (0.125 mg total) by mouth daily.   diltiazem 240 MG 24  hr capsule Commonly known as:  CARDIZEM CD Take 1 capsule (240 mg total) by mouth daily.   furosemide 40 MG tablet Commonly known as:  LASIX Begin taking 40mg  daily What changed:  how much to take  how to take this  when to take this  additional instructions   gabapentin 300 MG capsule Commonly known as:  NEURONTIN Take 300-900 mg by mouth See admin instructions. Take 300 mg by mouth in the morning and take 900 mg by mouth in the evening.     levothyroxine 50 MCG tablet Commonly known as:  SYNTHROID, LEVOTHROID Take 50 mcg by mouth daily.   lisinopril 2.5 MG tablet Commonly known as:  PRINIVIL,ZESTRIL Take 2.5 mg by mouth daily.   metFORMIN 500 MG tablet Commonly known as:  GLUCOPHAGE Take 1,000 mg by mouth 2 (two) times daily.   metoprolol 100 MG tablet Commonly known as:  LOPRESSOR TAKE ONE TABLET BY MOUTH TWICE DAILY   potassium chloride SA 20 MEQ tablet Commonly known as:  K-DUR,KLOR-CON Take 1 tablet (20 mEq total) by mouth 2 (two) times daily.   psyllium 58.6 % powder Commonly known as:  METAMUCIL Take 1 packet by mouth daily.   simvastatin 10 MG tablet Commonly known as:  ZOCOR Take 1 tablet (10 mg total) by mouth daily.   VISION-VITE PRESERVE PO Take 1 capsule by mouth 2 (two) times daily.   warfarin 2.5 MG tablet Commonly known as:  COUMADIN Take 0.5 to 1 tablet daily as directed by Coumadin clinic. What changed:  how much to take  how to take this  when to take this  additional instructions       Day of Discharge BP (!) 111/59 (BP Location: Right Arm)   Pulse 85   Temp 97.3 F (36.3 C) (Oral)   Resp (!) 27   Ht 5\' 6"  (1.676 m)   Wt 66.9 kg (147 lb 7.8 oz)   SpO2 98%   BMI 23.81 kg/m   Physical Exam: General: No acute respiratory distress Lungs: Clear to auscultation bilaterally without wheezes or crackles Cardiovascular: Regular rate and rhythm without murmur gallop or rub normal S1 and S2 Abdomen: Nontender, nondistended, soft, bowel sounds positive, no rebound, no ascites, no appreciable mass Extremities: No significant cyanosis, clubbing, or edema bilateral lower extremities  Basic Metabolic Panel:  Recent Labs Lab 08/11/2016 2344 08/21/16 0650 08/22/16 0238 07/07/2016 0239  NA 136 137 137 137  K 4.3 4.0 4.4 4.4  CL 102 102 101 102  CO2 23 22 27 26   GLUCOSE 201* 166* 148* 162*  BUN 22* 16 14 14   CREATININE 0.86 0.70 0.68 0.77  CALCIUM 9.2 9.1 9.1 9.0  MG 2.0  --   2.0 2.0    Liver Function Tests:  Recent Labs Lab 08/21/16 0650  AST 31  ALT 16  ALKPHOS 81  BILITOT 1.1  PROT 7.5  ALBUMIN 3.9   Coags:  Recent Labs Lab 08/18/2016 2344 08/22/16 0238 07/07/2016 0239  INR 4.31* 4.28* 3.01   CBC:  Recent Labs Lab 08/05/2016 2355 08/21/16 0650 08/22/16 0238  WBC 16.5* 13.1* 10.2  NEUTROABS 14.2* 10.4*  --   HGB 11.8* 11.5* 10.4*  HCT 36.3 35.3* 32.3*  MCV 89.9 89.4 89.7  PLT 257 250 231    Cardiac Enzymes:  Recent Labs Lab 08/19/2016 2344 08/21/16 0650 08/21/16 1131 08/21/16 1759 08/22/16 0238  TROPONINI <0.03 0.05* 0.06* 0.05* 0.03*   BNP (last 3 results)  Recent Labs  08/14/2016 2353  BNP 273.3*    ProBNP (last 3 results) No results for input(s): PROBNP in the last 8760 hours.   CBG:  Recent Labs Lab 08/22/16 1556 08/22/16 2138 08/17/2016 0354 08/30/2016 0800 08/06/2016 1213  GLUCAP 243* 142* 156* 129* 222*    Recent Results (from the past 240 hour(s))  MRSA PCR Screening     Status: None   Collection Time: 08/21/16  4:58 AM  Result Value Ref Range Status   MRSA by PCR NEGATIVE NEGATIVE Final    Comment:        The GeneXpert MRSA Assay (FDA approved for NASAL specimens only), is one component of a comprehensive MRSA colonization surveillance program. It is not intended to diagnose MRSA infection nor to guide or monitor treatment for MRSA infections.     Time spent in discharge (includes decision making & examination of pt): >30 minutes  08/31/2016, 2:31 PM   Lonia BloodJeffrey T. Anielle Headrick, MD [facility] Office  [phone] Pager [phone]  On-Call/Text Page:      [email or web address]      password [email or web address]

## 2016-08-23 NOTE — ED Notes (Addendum)
Delay in lab draw, exray in room.  will try to get blood when start A line.Marland Kitchen.Marland Kitchen..Marland Kitchen

## 2016-08-23 NOTE — Discharge Instructions (Signed)
Atrial Fibrillation °Introduction °Atrial fibrillation is a type of heartbeat that is irregular or fast (rapid). If you have this condition, your heart keeps quivering in a weird (chaotic) way. This condition can make it so your heart cannot pump blood normally. Having this condition gives a person more risk for stroke, heart failure, and other heart problems. There are different types of atrial fibrillation. Talk with your doctor to learn about the type that you have. °Follow these instructions at home: °· Take over-the-counter and prescription medicines only as told by your doctor. °· If your doctor prescribed a blood-thinning medicine, take it exactly as told. Taking too much of it can cause bleeding. If you do not take enough of it, you will not have the protection that you need against stroke and other problems. °· Do not use any tobacco products. These include cigarettes, chewing tobacco, and e-cigarettes. If you need help quitting, ask your doctor. °· If you have apnea (obstructive sleep apnea), manage it as told by your doctor. °· Do not drink alcohol. °· Do not drink beverages that have caffeine. These include coffee, soda, and tea. °· Maintain a healthy weight. Do not use diet pills unless your doctor says they are safe for you. Diet pills may make heart problems worse. °· Follow diet instructions as told by your doctor. °· Exercise regularly as told by your doctor. °· Keep all follow-up visits as told by your doctor. This is important. °Contact a doctor if: °· You notice a change in the speed, rhythm, or strength of your heartbeat. °· You are taking a blood-thinning medicine and you notice more bruising. °· You get tired more easily when you move or exercise. °Get help right away if: °· You have pain in your chest or your belly (abdomen). °· You have sweating or weakness. °· You feel sick to your stomach (nauseous). °· You notice blood in your throw up (vomit), poop (stool), or pee (urine). °· You are  short of breath. °· You suddenly have swollen feet and ankles. °· You feel dizzy. °· Your suddenly get weak or numb in your face, arms, or legs, especially if it happens on one side of your body. °· You have trouble talking, trouble understanding, or both. °· Your face or your eyelid droops on one side. °These symptoms may be an emergency. Do not wait to see if the symptoms will go away. Get medical help right away. Call your local emergency services (911 in the [place] Do not drive yourself to the hospital.  °This information is not intended to replace advice given to you by your health care provider. Make sure you discuss any questions you have with your health care provider. °Document Released: 06/27/2008 Document Revised: 02/24/2016 Document Reviewed: 01/13/2015 °© 2017 Elsevier ° °

## 2016-08-23 NOTE — Progress Notes (Signed)
ANTICOAGULATION CONSULT NOTE - Initial Consult  Pharmacy Consult for heparin Indication: s/p recent TAVR, Afib, and s/p Vfib arrest now being cooled  Allergies  Allergen Reactions  . Amiodarone Other (See Comments)    Stops her heart  . Aspirin Other (See Comments)    Gi bleeding  . Codeine Other (See Comments)    GI Lead  . Tramadol Nausea And Vomiting  . Lipitor [Atorvastatin Calcium] Other (See Comments)    Muscle Ache  . Pravachol Other (See Comments)    Muscle Ache    Patient Measurements: Height: 5\' 6"  (167.6 cm) Weight: 147 lb 7.8 oz (66.9 kg) IBW/kg (Calculated) : 59.3  Vital Signs: Temp: 97.3 F (36.3 C) (11/22 1200) Temp Source: Oral (11/22 1200) BP: 133/75 (11/22 2200) Pulse Rate: 107 (11/22 2200)  Labs:  Recent Labs  08/31/2016 2344  08/21/16 0650 08/21/16 1131 08/21/16 1759 08/22/16 0238 08/16/2016 0239 08/28/2016 1949 08/28/2016 1951  HGB  --   < > 11.5*  --   --  10.4*  --  9.1* 11.9*  HCT  --   < > 35.3*  --   --  32.3*  --  28.4* 35.0*  PLT  --   < > 250  --   --  231  --  217  --   APTT 55*  --   --   --   --   --   --   --   --   LABPROT 41.3*  --   --   --   --  42.2* 31.9*  --   --   INR 4.31*  --   --   --   --  4.28* 3.01  --   --   CREATININE 0.86  --  0.70  --   --  0.68 0.77 0.82 1.00  TROPONINI <0.03  --  0.05* 0.06* 0.05* 0.03*  --   --   --   < > = values in this interval not displayed.  Estimated Creatinine Clearance: 37.1 mL/min (by C-G formula based on SCr of 1 mg/dL).   Medical History: Past Medical History:  Diagnosis Date  . Arthritis   . Chronic diastolic congestive heart failure (HCC)   . Diabetes mellitus   . History of peptic ulcer disease   . Hypercholesteremia   . Hyperlipidemia   . Hypertension   . Hypothyroidism   . Peripheral neuropathy (HCC)   . Permanent atrial fibrillation (HCC)   . Presence of permanent cardiac pacemaker   . S/P TAVR (transcatheter aortic valve replacement) 07/04/2016   23 mm Edwards Sapien  3 transcatheter heart valve placed via percutaneous right transfemoral approach  . Severe aortic stenosis   . Tachycardia-bradycardia syndrome (HCC)    a. s/p SJM single chamber PPM implanted by Dr Reyes IvanKersey 2009  . Thrombocytopenia (HCC)   . Thyroid disease      Assessment: 82yo female w/ recent TAVR discharged this am presents s/p Vfib arrest, now being cooled, in Afib w/ RVR, to transition from Coumadin to heparin; INR this am prior to discharge was 3.01, Coumadin had been on hold for elevated INRs.  Goal of Therapy:  Heparin level 0.3-0.7 units/ml Monitor platelets by anticoagulation protocol: Yes   Plan:  Will monitor INR and start heparin when INR <2; will dose conservatively 2/2 cooling.  Vernard GamblesVeronda Dreyah Montrose, PharmD, BCPS  08/08/2016,10:54 PM

## 2016-08-23 NOTE — Progress Notes (Signed)
Alerted donna w [facility] so hhc services will resume.

## 2016-08-23 NOTE — Progress Notes (Signed)
Inpatient Diabetes Program Recommendations  AACE/ADA: New Consensus Statement on Inpatient Glycemic Control (2015)  Target Ranges:  Prepandial:   less than 140 mg/dL      Peak postprandial:   less than 180 mg/dL (1-2 hours)      Critically ill patients:  140 - 180 mg/dL  Results for Loma BostonJOYCE, Eleny H (MRN [id number]) as of 08/09/2016 11:06  Ref. Range 08/22/2016 07:49 08/22/2016 11:57 08/22/2016 15:56 08/22/2016 21:38 08/05/2016 03:54 08/18/2016 08:00  Glucose-Capillary Latest Ref Range: 65 - 99 mg/dL 829138 (H) 562258 (H) 130243 (H) 142 (H) 156 (H) 129 (H)   Review of Glycemic Control  Diabetes history: DM2 Outpatient Diabetes medications: Metformin 1000 mg BID Current orders for Inpatient glycemic control: Novolog 0-15 units Q4H  Inpatient Diabetes Program Recommendations: Correction (SSI): Per chart patient is eating well, please consider changing frequency of CBGs and Novolog to ACHS. Insulin - Meal Coverage: Please consider ordering Novolog 4 units TID with meals for meal coverage if patient eats at least 50% of meal.  Thanks, Orlando PennerMarie Jess Toney, RN, MSN, CDE Diabetes Coordinator Inpatient Diabetes Program [phone] (Team Pager from 8am to 5pm)

## 2016-08-23 NOTE — Progress Notes (Signed)
Patient Name: Janet BostonMillie H Schuermann Date of Encounter: 08/05/2016  Principal Problem:   Acute respiratory failure with hypoxia (HCC) Active Problems:   S/P TAVR (transcatheter aortic valve replacement)   Atrial fibrillation with RVR (HCC)   Acute diastolic CHF (congestive heart failure) (HCC)   CHF (congestive heart failure) (HCC)   Chronic diastolic CHF (congestive heart failure) (HCC)   Uncontrolled type 2 diabetes mellitus with complication (HCC)   Other specified hypothyroidism   Length of Stay: 2  SUBJECTIVE  She feels well this am, no SOB.  CURRENT MEDS . calcium carbonate  1,250 mg Oral Once per day on Mon Wed Fri  . digoxin  0.125 mg Oral Daily  . diltiazem  240 mg Oral Daily  . furosemide  40 mg Oral Daily  . gabapentin  300 mg Oral q morning - 10a   And  . gabapentin  900 mg Oral QHS  . insulin aspart  0-15 Units Subcutaneous Q4H  . levothyroxine  50 mcg Oral QAC breakfast  . metoprolol tartrate  100 mg Oral BID  . potassium chloride SA  20 mEq Oral BID  . simvastatin  10 mg Oral q1800  . sodium chloride flush  3 mL Intravenous Q12H   OBJECTIVE  Vitals:   08/13/2016 0300 08/16/2016 0400 08/21/2016 0500 08/30/2016 0758  BP: (!) 98/44 105/68  (!) 221/71  Pulse: (!) 40 89  90  Resp: 17 16  16   Temp: 97.4 F (36.3 C)   97.4 F (36.3 C)  TempSrc: Oral   Oral  SpO2: 92% 93%  95%  Weight:   147 lb 7.8 oz (66.9 kg)   Height:        Intake/Output Summary (Last 24 hours) at 08/19/2016 0902 Last data filed at 08/10/2016 0800  Gross per 24 hour  Intake              960 ml  Output             1850 ml  Net             -890 ml   Filed Weights   08/21/16 0451 08/22/16 0357 08/15/2016 0500  Weight: 151 lb 7.3 oz (68.7 kg) 149 lb 14.6 oz (68 kg) 147 lb 7.8 oz (66.9 kg)   PHYSICAL EXAM  General: Pleasant, NAD. Neuro: Alert and oriented X 3. Moves all extremities spontaneously. Psych: Normal affect. HEENT:  Normal  Neck: Supple without bruits or JVD. Lungs:  Resp regular and  unlabored, CTA. Heart: RRR no s3, s4, or murmurs. Abdomen: Soft, non-tender, non-distended, BS + x 4.  Extremities: No clubbing, cyanosis or edema. DP/PT/Radials 2+ and equal bilaterally.  Accessory Clinical Findings  CBC  Recent Labs  12/29/2015 2355 08/21/16 0650 08/22/16 0238  WBC 16.5* 13.1* 10.2  NEUTROABS 14.2* 10.4*  --   HGB 11.8* 11.5* 10.4*  HCT 36.3 35.3* 32.3*  MCV 89.9 89.4 89.7  PLT 257 250 231   Basic Metabolic Panel  Recent Labs  08/22/16 0238 08/22/2016 0239  NA 137 137  K 4.4 4.4  CL 101 102  CO2 27 26  GLUCOSE 148* 162*  BUN 14 14  CREATININE 0.68 0.77  CALCIUM 9.1 9.0  MG 2.0 2.0   Liver Function Tests  Recent Labs  08/21/16 0650  AST 31  ALT 16  ALKPHOS 81  BILITOT 1.1  PROT 7.5  ALBUMIN 3.9   No results for input(s): LIPASE, AMYLASE in the last 72 hours. Cardiac Enzymes  Recent Labs  08/21/16 1131 08/21/16 1759 08/22/16 0238  TROPONINI 0.06* 0.05* 0.03*    Recent Labs  08/21/16 0650  TSH 2.261   Radiology/Studies  Dg Chest 2 View  Result Date: 08/21/2016 CLINICAL DATA:  Shortness of breath starting at 8 p.m. and gradually worsening. History of atrial fibrillation and hypertension. EXAM: CHEST  2 VIEW COMPARISON:  07/14/2016 FINDINGS: Postoperative changes in the mediastinum. Cardiac pacemaker. Mild cardiac enlargement. Increasing interstitial pattern to the lungs mostly in the periphery and lung bases, likely representing developing edema. Small bilateral pleural effusions. No focal consolidation. No pneumothorax. Calcified and tortuous aorta. Degenerative changes in the spine and shoulders. IMPRESSION: Cardiac enlargement with increasing interstitial pattern to the lungs suggesting developing interstitial edema. Small bilateral pleural effusions. Electronically Signed   By: Burman NievesWilliam  Stevens M.D.   On: 08/21/2016 01:18   Dg Chest Port 1 View  Result Date: 08/22/2016 CLINICAL DATA:  Follow-up pleural effusions.  Subsequent  encounter. EXAM: PORTABLE CHEST 1 VIEW COMPARISON:  Chest radiograph performed 08/21/2016 FINDINGS: A small left pleural effusion is again noted, similar in appearance to the prior study. The right-sided pleural effusion appears to have largely resolved. Vascular congestion is noted. Increased interstitial markings may reflect mild interstitial edema or possibly pneumonia. No pneumothorax is seen. The cardiomediastinal silhouette is borderline normal in size. A stent is noted overlying the heart. A pacemaker is noted overlying the left chest wall, with a lead ending overlying the right ventricle. No acute osseous abnormalities are seen. Degenerative change is noted at the right glenohumeral joint. IMPRESSION: Small left pleural effusion, similar in appearance to the prior study. Right-sided pleural effusion appears to have largely resolved. Vascular congestion noted. Increased interstitial markings may reflect mild interstitial edema or possibly pneumonia. Electronically Signed   By: Roanna RaiderJeffery  Chang M.D.   On: 08/22/2016 03:34   TELE: a-fib, in 4770' overnight , up to 110 this am    ASSESSMENT AND PLAN  82 year old female with a past medical history of TAVR (23mm Stephani Policedward Sapien 3 valve) on 07/04/2016, chronic diastolic CHF, DM, HLD, HTN, and  tachy-brady syndrome s/p SJM single chamber PPM, now in persistent a-fib. LVEF 55%. She presented to the ED on 08/06/2016 with SOB and rapid Afib associated with SOB. She was started on iv bilateral pleural effusions. She was started on IV diltiazem that slowed down her HR from 140-->70 with improvement of symptoms. Now back on PO diltiazem and metoprolol.  1. Atrial fibrillation with RVR: Now rate controlled on po diltiazem, metoprolol and digoxin, metoprolol 150mg  BID. This patients CHA2DS2-VASc Score and unadjusted Ischemic Stroke Rate (% per year) is equal to 7.2 % stroke rate/year from a score of 5 Above score calculated as 1 point each if present [CHF, HTN, DM,  Vascular=MI/PAD/Aortic Plaque, Age if 65-74, or Female], 2 points each if present [Age > 75, or Stroke/TIA/TE] She has a bump on the forehead from a mechanical fall, she states that this was unusual, she doesn't fall otherwise.  2. Chronic diastolic CHF: Exacerbated by Afib RVR. We will again hold lasix this am and resume lasix 40 mg po daily tomorrow.   3. DM2: Management per primary team  4. HLD: On statin, continue same.   She can be discharged today, we will arrange an outpatient follow up.   Signed, Tobias AlexanderKatarina Kennah Hehr MD, [facility] 08/04/2016

## 2016-08-23 NOTE — Telephone Encounter (Signed)
Pt and Pt daughter called about INR check to be scheduled on Friday. Due to our office being closed there is no way to schedule for Friday. She was given an appt on Tuesday at discharge, I have moved this to Monday and directed to take 1.25mg  today and 2.5mg  daily all other days (she states she was told to take 2.5mg  daily at discharge).   Pt voiced understanding that if issue over weekend to call on call or go to ER.   She also wishes to have follow up with Dr. Jens Somrenshaw moved up (as directed by Dr. Delton SeeNelson in hospital). Will forward to Dr. Ludwig Clarksrenshaw's nurse to see if this can be arranged.

## 2016-08-23 NOTE — Progress Notes (Signed)
Pt discharged to home with family.  PT verbalized understanding of discharge instructions and follow up care.  All belongings sent home with pt and family.  Education provided re: medications, when to call MD and follow up care.  Sundra AlandMaura S Lajoy Vanamburg, RN

## 2016-08-23 NOTE — ED Notes (Signed)
Family at bedside , x ray in to shoot 2nd chest xray after RT repositioned the Et tube

## 2016-08-23 NOTE — Progress Notes (Signed)
Physical Therapy Treatment Patient Details Name: [name] MRN: [id number] DOB: 04-Sep-1927 Today's Date: 08/14/2016    History of Present Illness 82 year old female with a past medical history of TAVR (23mm Stephani Policedward Sapien 3 valve) on 07/04/2016, chronic diastolic CHF, DM, HLD, HTN, and  tachy-brady syndrome s/p SJM single chamber PPM. She presented to the ED on 08/07/2016 with SOB and rapid Afib    PT Comments    Pt presented sitting OOB in recliner when PT entered room. Pt stated that she was d/c'ing home today. Pt still with instability with ambulation. She had LOB x2 but was able to self correct, holding onto hallway hand rail and stable surfaces. Additionally during ambulation pt's SPO2 decreased to 81% x1 but increased back up to 90% with a standing rest break of approximately 30 seconds. Pt's HR also increased to 122 bpm during ambulation. Resting HR at end of session was 90 bpm. Pt would continue to benefit from skilled physical therapy services at this time while admitted to address her limitations in order to improve her overall safety and independence with functional mobility.    Follow Up Recommendations  Supervision - Intermittent;No PT follow up     Equipment Recommendations  None recommended by PT    Recommendations for Other Services       Precautions / Restrictions Precautions Precautions: Fall Restrictions Weight Bearing Restrictions: No    Mobility  Bed Mobility               General bed mobility comments: pt sitting OOB in recliner when PT entered room  Transfers Overall transfer level: Needs assistance Equipment used: None Transfers: Sit to/from Stand Sit to Stand: Min guard         General transfer comment: min guard for safety  Ambulation/Gait Ambulation/Gait assistance: Min guard Ambulation Distance (Feet): 300 Feet Assistive device: None Gait Pattern/deviations: Step-through pattern;Decreased stride length;Drifts right/left Gait  velocity: decreased Gait velocity interpretation: Below normal speed for age/gender General Gait Details: pt with some instability with ambulation noted throughout. pt with LOB x2 and able to self correct with holding onto hallway handrail and/or other stable surface. No physical assist needed.   Stairs            Wheelchair Mobility    Modified Rankin (Stroke Patients Only)       Balance Overall balance assessment: Needs assistance Sitting-balance support: Feet supported;No upper extremity supported Sitting balance-Leahy Scale: Good     Standing balance support: During functional activity;No upper extremity supported Standing balance-Leahy Scale: Fair Standing balance comment: increased sway with static standing but no physical assist needed                    Cognition Arousal/Alertness: Awake/alert Behavior During Therapy: WFL for tasks assessed/performed Overall Cognitive Status: Within Functional Limits for tasks assessed                      Exercises      General Comments        Pertinent Vitals/Pain Pain Assessment: No/denies pain    Home Living                      Prior Function            PT Goals (current goals can now be found in the care plan section) Acute Rehab PT Goals Patient Stated Goal: to get back home PT Goal Formulation: With patient Time For  Goal Achievement: 09/05/16 Potential to Achieve Goals: Good Progress towards PT goals: Progressing toward goals    Frequency    Min 3X/week      PT Plan Current plan remains appropriate    Co-evaluation             End of Session Equipment Utilized During Treatment: Gait belt Activity Tolerance: Patient tolerated treatment well Patient left: in chair;with call bell/phone within reach;with family/visitor present     Time: 1022-1036 PT Time Calculation (min) (ACUTE ONLY): 14 min  Charges:  $Gait Training: 8-22 mins                    G CodesAlessandra Bevels:       Addalynn Kumari M Danyah Guastella June 08, 2016, 10:47 AM Deborah ChalkJennifer Shanyla Marconi, PT, DPT [phone]

## 2016-08-23 NOTE — Progress Notes (Signed)
ANTICOAGULATION CONSULT NOTE   Pharmacy Consult for warfarin Indication: atrial fibrillation  Allergies  Allergen Reactions  . Amiodarone Other (See Comments)    Stops her heart  . Aspirin Other (See Comments)    Gi bleeding  . Codeine Other (See Comments)    GI Lead  . Tramadol Nausea And Vomiting  . Lipitor [Atorvastatin Calcium] Other (See Comments)    Muscle Ache  . Pravachol Other (See Comments)    Muscle Ache    Patient Measurements: Height: 5\' 6"  (167.6 cm) Weight: 147 lb 7.8 oz (66.9 kg) IBW/kg (Calculated) : 59.3  Vital Signs: Temp: 97.3 F (36.3 C) (11/22 1200) Temp Source: Oral (11/22 1200) BP: 111/59 (11/22 1200) Pulse Rate: 85 (11/22 1200)  Labs:  Recent Labs  08/05/2016 2344  08/25/2016 2355 08/21/16 0650 08/21/16 1131 08/21/16 1759 08/22/16 0238 08/08/2016 0239  HGB  --   < > 11.8* 11.5*  --   --  10.4*  --   HCT  --   --  36.3 35.3*  --   --  32.3*  --   PLT  --   --  257 250  --   --  231  --   APTT 55*  --   --   --   --   --   --   --   LABPROT 41.3*  --   --   --   --   --  42.2* 31.9*  INR 4.31*  --   --   --   --   --  4.28* 3.01  CREATININE 0.86  --   --  0.70  --   --  0.68 0.77  TROPONINI <0.03  --   --  0.05* 0.06* 0.05* 0.03*  --   < > = values in this interval not displayed.  Estimated Creatinine Clearance: 46.4 mL/min (by C-G formula based on SCr of 0.77 mg/dL).   Medical History: Past Medical History:  Diagnosis Date  . Arthritis   . Chronic diastolic congestive heart failure (HCC)   . Diabetes mellitus   . History of peptic ulcer disease   . Hypercholesteremia   . Hyperlipidemia   . Hypertension   . Hypothyroidism   . Peripheral neuropathy (HCC)   . Permanent atrial fibrillation (HCC)   . Presence of permanent cardiac pacemaker   . 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 Severe aortic stenosis    . Tachycardia-bradycardia syndrome (HCC)    a. s/p SJM single chamber PPM implanted by Dr Reyes IvanKersey 2009  . Thrombocytopenia (HCC)   . Thyroid disease     Assessment: [age] yoF with chronic atrial fibrillation on warfarin PTA.  INR this morning is down to 3.01 and CBC remains stable.  PTA dose of warfarin is 2.5 mg daily except 1.25 mg on Wednesday. We will re-initiate warfarin today as patient is near therapeutic range of 2-3 and to prevent her from dropping too low . No signs of bleeding noted.    Goal of Therapy:  INR 2-3 Monitor platelets by anticoagulation protocol: Yes   Plan:  -Warfarin 1.25 mg X 1 tonight  -Daily INR  -CBC every 3 days while on warfarin  - Monitor for s/sx of bleeding   Bailey MechEmily Stewart, PharmD PGY1 Pharmacy Resident 08/11/2016, 2:22 PM Pager: [phone]

## 2016-08-23 NOTE — Telephone Encounter (Signed)
Done in error.

## 2016-08-23 NOTE — Consult Note (Signed)
CARDIOLOGY CONSULT NOTE   Patient ID: Loma BostonMillie H Janusz MRN: [id number], DOB/AGE: 10/02/1927   Admit date: 08/17/2016 Date of Consult: 08/04/2016   Primary Physician: Lorenda PeckOBERTS, RONALD WAYNE, MD Primary Cardiologist: Dr. Jens Somrenshaw TAVR specialist: Dr. Excell Seltzerooper  Pt. Profile  Patient is a 82 year old Caucasian female with past medical history of HTN, HLD, DM, chronic diastolic heart failure, history of tachy-brady syndrome s/p St. Jude single-chamber PPM and h/o severe AS with recent TAVR who was discharged this morning after admission for acute respiratory failure likely is secondary to diastolic heart failure associated with atrial fibrillation with RVR, she had a witnessed out-of-hospital cardiac arrest, total downtime roughly 30 minutes.  Problem List  Past Medical History:  Diagnosis Date  . Arthritis   . Chronic diastolic congestive heart failure (HCC)   . Diabetes mellitus   . History of peptic ulcer disease   . Hypercholesteremia   . Hyperlipidemia   . Hypertension   . Hypothyroidism   . Peripheral neuropathy (HCC)   . Permanent atrial fibrillation (HCC)   . Presence of permanent cardiac pacemaker   . 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 Severe aortic stenosis   . Tachycardia-bradycardia syndrome (HCC)    a. s/p SJM single chamber PPM implanted by Dr Reyes IvanKersey 2009  . Thrombocytopenia (HCC)   . Thyroid disease     Past Surgical History:  Procedure Laterality Date  . ABDOMINAL HYSTERECTOMY    . APPENDECTOMY    . BREAST BIOPSY  02/22/2007    left  . CARDIAC CATHETERIZATION N/A 06/14/2016   Procedure: Left Heart Cath and Coronary Angiography;  Surgeon: Tonny BollmanMichael Cooper, MD;  [facility];  Service: Cardiovascular;  Laterality: N/A;  . CARDIOVERSION  05/16/2005   Electrical cardioversion   . ESOPHAGOGASTRODUODENOSCOPY  03/12/2007    Esophagogastroduodenoscopy with control of bleeding  . PACEMAKER INSERTION  06/18/2008   SJM Zephyr XL SR implanted by Dr Reyes IvanKersey  . SHOULDER SURGERY  12/07/2004  . TEE WITHOUT CARDIOVERSION N/A 07/04/2016   Procedure: TRANSESOPHAGEAL ECHOCARDIOGRAM (TEE);  Surgeon: Tonny BollmanMichael Cooper, MD;  [facility];  Service: Open Heart Surgery;  Laterality: N/A;  . TRANSCATHETER AORTIC VALVE REPLACEMENT, TRANSFEMORAL N/A 07/04/2016   Procedure: TRANSCATHETER AORTIC VALVE REPLACEMENT, TRANSFEMORAL;  Surgeon: Tonny BollmanMichael Cooper, MD;  [facility];  Service: Open Heart Surgery;  Laterality: N/A;     Allergies  Allergies  Allergen Reactions  . Amiodarone Other (See Comments)    Stops her heart  . Aspirin Other (See Comments)    Gi bleeding  . Codeine Other (See Comments)    GI Lead  . Tramadol Nausea And Vomiting  . Lipitor [Atorvastatin Calcium] Other (See Comments)    Muscle Ache  . Pravachol Other (See Comments)    Muscle Ache    HPI   Patient is a 82 year old Caucasian female with past medical history of HTN, HLD, DM, chronic diastolic heart failure, history of tachy-brady syndrome s/p St. Jude single-chamber PPM and h/o severe AS with recent TAVR. As part of the pre-TAVR workup, she underwent cardiac catheterization on 06/14/2016 which showed EF 55-60%, 2+ mitral regurgitation, and coronary arteries with no angiographic disease in LAD, left main, RCA and minimal irregularities in the left circumflex. Also have chronic atrial fibrillation previously on Coumadin. She ultimately underwent TAVR by Dr. Rosanne Sacklden and Dr. Excell Seltzerooper on 07/04/2016 with a 23 mm Edward Sapien 3 THV valve.  She was last seen by Dr. Excell Seltzer in the office on 08/03/2016 as part of the 30 day postop visit, she was doing well at this time. Same day echocardiogram obtained on 11/2 showed EF 55%, bioprosthetic aortic valve with mild perivalvular regurgitation, mild MR, PA peak pressure 43 mmHg.  She was recently admitted for acute respiratory  failure with hypoxia. On arrival to the ED, she was noted to be in atrial fibrillation with RVR. Chest x-ray showed bilateral effusion with congestion. She was started on Cardizem drip. Her allergy was consulted for atrial fibrillation, it was felt her acute heart failure was exacerbated by atrial fibrillation with RVR. She was eventually switched to PO Cardizem, metoprolol and digoxin. She was discharged the day, unfortunately after she got home, she had a witnessed cardiac arrest. Initial 911 call was made at 18:37, family immediately started CPR. [facility] arrived on 18:46. He underwent CPR, with defibrillation 2 at 200 J and 300 J. He received one round of epinephrine. Initial rhythm showed ventricular fibrillation based on strips brought in by EMS. Post cardioversion, she went back into her atrial fibrillation. Initial blood pressure on arrival was 140/100. I have her after cardiac arrest, her systolic blood pressure remained in the 60s.   On arrival to [facility], she was still intubated, she is receiving aggressive IV hydration to IO. Patient is unresponsive, has pinpoint pupils. Cardiology was initially consulted for potential cardiac arrest, upon further review, EKG showed her usual atrial fibrillation with wide complex tachycardia. There is no sign of STEMI.    No current facility-administered medications on file prior to encounter.    Current Outpatient Prescriptions on File Prior to Encounter  Medication Sig Dispense Refill  . amoxicillin (AMOXIL) 500 MG capsule Take 500 mg by mouth daily as needed (for dental appointments).     . calcium carbonate (OS-CAL) 600 MG TABS Take 1,200 mg by mouth 3 (three) times a week.     . digoxin (LANOXIN) 0.125 MG tablet Take 1 tablet (0.125 mg total) by mouth daily. 30 tablet 6  . diltiazem (CARDIZEM CD) 240 MG 24 hr capsule Take 1 capsule (240 mg total) by mouth daily. 30 capsule 6  . furosemide (LASIX) 40 MG tablet Begin taking 40mg  daily  (Patient taking differently: Take 40 mg by mouth daily. ) 135 tablet 3  . gabapentin (NEURONTIN) 300 MG capsule Take 300-900 mg by mouth See admin instructions. Take 300 mg by mouth in the morning and take 900 mg by mouth in the evening.    Marland Kitchen levothyroxine (SYNTHROID, LEVOTHROID) 50 MCG tablet Take 50 mcg by mouth daily.      Marland Kitchen lisinopril (PRINIVIL,ZESTRIL) 2.5 MG tablet Take 2.5 mg by mouth daily.    . metFORMIN (GLUCOPHAGE) 500 MG tablet Take 1,000 mg by mouth 2 (two) times daily.     . metoprolol (LOPRESSOR) 100 MG tablet TAKE ONE TABLET BY MOUTH TWICE DAILY 180 tablet 3  . Multiple Vitamins-Minerals (VISION-VITE PRESERVE PO) Take 1 capsule by mouth 2 (two) times daily.    . potassium chloride SA (K-DUR,KLOR-CON) 20 MEQ tablet Take 1 tablet (20 mEq total) by mouth 2 (two) times daily. 60 tablet 12  . psyllium (METAMUCIL) 58.6 % powder Take 1 packet by mouth daily.    . simvastatin (ZOCOR) 10 MG tablet Take 1 tablet (10 mg total) by mouth daily. 30 tablet 12  . warfarin (COUMADIN) 2.5 MG tablet Take 0.5 to 1 tablet daily as directed by Coumadin clinic. (Patient  taking differently: Take 1.25-2.5 mg by mouth See admin instructions. Take 1/2 tablet on Wednesday then take 1 tablet all there other days) 30 tablet 3      Family History Family History  Problem Relation Age of Onset  . Heart disease Father   . Emphysema Father   . Heart failure Father   . Coronary artery disease Father   . Stroke Sister     cerebral hemorrhage  . Breast cancer Sister   . Heart disease Sister   . Heart failure Sister   . Heart attack Neg Hx      Social History Social History   Social History  . Marital status: Widowed    Spouse name: N/A  . Number of children: N/A  . Years of education: N/A   Occupational History  . Retired    Social History Main Topics  . Smoking status: Former Smoker    Packs/day: 1.00    Years: 10.00    Types: Cigarettes    Quit date: 03/06/1961  . Smokeless tobacco: Never  Used  . Alcohol use No  . Drug use: No  . Sexual activity: Not on file   Other Topics Concern  . Not on file   Social History Narrative   Lives alone near [place], [place].     Review of Systems  Unable to assess review of systems due to patient's current condition.  Physical Exam  There were no vitals taken for this visit.  General: Unable to assess Neuro: Pupil pinpoint, no purposeful movement HEENT: Normal  Neck: Intubated Lungs:  CTA anteriorly Heart: Irregularly irregular, tachycardic. no s3, s4, or murmurs. Abdomen: Abdomen soft. Extremities: 1+ bilateral lower extremity edema. Strong carotid pulse, and also strong femoral pulse as well. Intraosseous IV running.  Labs   Recent Labs  08/21/16 0650 08/21/16 1131 08/21/16 1759 08/22/16 0238  TROPONINI 0.05* 0.06* 0.05* 0.03*   Lab Results  Component Value Date   WBC 10.2 08/22/2016   HGB 10.4 (L) 08/22/2016   HCT 32.3 (L) 08/22/2016   MCV 89.7 08/22/2016   PLT 231 08/22/2016    Recent Labs Lab 08/21/16 0650  09-08-16 0239  NA 137  < > 137  K 4.0  < > 4.4  CL 102  < > 102  CO2 22  < > 26  BUN 16  < > 14  CREATININE 0.70  < > 0.77  CALCIUM 9.1  < > 9.0  PROT 7.5  --   --   BILITOT 1.1  --   --   ALKPHOS 81  --   --   ALT 16  --   --   AST 31  --   --   GLUCOSE 166*  < > 162*  < > = values in this interval not displayed. Lab Results  Component Value Date   CHOL 132 08-Sep-2016   HDL 36 (L) 09-08-16   LDLCALC 78 Sep 08, 2016   TRIG 89 Sep 08, 2016   No results found for: DDIMER  Radiology/Studies  Dg Chest 2 View  Result Date: 08/21/2016 CLINICAL DATA:  Shortness of breath starting at 8 p.m. and gradually worsening. History of atrial fibrillation and hypertension. EXAM: CHEST  2 VIEW COMPARISON:  07/14/2016 FINDINGS: Postoperative changes in the mediastinum. Cardiac pacemaker. Mild cardiac enlargement. Increasing interstitial pattern to the lungs mostly in the periphery and lung bases, likely  representing developing edema. Small bilateral pleural effusions. No focal consolidation. No pneumothorax. Calcified and tortuous aorta. Degenerative changes in the  spine and shoulders. IMPRESSION: Cardiac enlargement with increasing interstitial pattern to the lungs suggesting developing interstitial edema. Small bilateral pleural effusions. Electronically Signed   By: Burman Nieves M.D.   On: 08/21/2016 01:18   Dg Chest Port 1 View  Result Date: 08/22/2016 CLINICAL DATA:  Follow-up pleural effusions.  Subsequent encounter. EXAM: PORTABLE CHEST 1 VIEW COMPARISON:  Chest radiograph performed 08/21/2016 FINDINGS: A small left pleural effusion is again noted, similar in appearance to the prior study. The right-sided pleural effusion appears to have largely resolved. Vascular congestion is noted. Increased interstitial markings may reflect mild interstitial edema or possibly pneumonia. No pneumothorax is seen. The cardiomediastinal silhouette is borderline normal in size. A stent is noted overlying the heart. A pacemaker is noted overlying the left chest wall, with a lead ending overlying the right ventricle. No acute osseous abnormalities are seen. Degenerative change is noted at the right glenohumeral joint. IMPRESSION: Small left pleural effusion, similar in appearance to the prior study. Right-sided pleural effusion appears to have largely resolved. Vascular congestion noted. Increased interstitial markings may reflect mild interstitial edema or possibly pneumonia. Electronically Signed   By: Roanna Raider M.D.   On: 08/22/2016 03:34    ECG  Atrial fibrillation without significant ST changes, IVCD.  ASSESSMENT AND PLAN  1. VFib arrest: This on the strips brought in by EMS, it appears to be ventricular fibrillation prior to the shock. Post defibrillation 2, patient had her usual chronic atrial fibrillation.  - repeat Echocardiogram. No indication for cardiac catheterization upon further review,  had a normal coronary in September. Discussed with the ED physician as well as interventional cardiologist, Dr. Swaz[place]and. Estimated total down time 30 minutes.  2. history of tachy-brady syndrome s/p St. Jude single-chamber PPM   - may consider interrogation  3. h/o severe AS with recent TAVR  - Bedside echo performed by Dr. Anne Fu, ejection fraction globally reduced EF 35-40%. Will repeat echocardiogram.  4. Run of atrial fibrillation   - Still on Coumadin. INR 3.01 on 08/21/2016.  - This patients CHA2DS2-VASc Score and unadjusted Ischemic Stroke Rate (% per year) is equal to 9.7 % stroke rate/year from a score of 6  Above score calculated as 1 point each if present [CHF, HTN, DM, Vascular=MI/PAD/Aortic Plaque, Age if 65-74, or Female] Above score calculated as 2 points each if present [Age > 75, or Stroke/TIA/TE]  5. HTN: Hypotensive per EMS, however in the emergency room, her blood pressure stabilized, currently hypertensive  6. HLD: Does not appears to be on a statin.  7. DM: On metformin at home  8. chronic diastolic heart failure: Will need to monitor her overall fluid status, she recently had volume overload due to atrial fibrillation with RVR, due to hypotension in the field with a systolic blood pressure in the 60s, she received aggressive IV hydration to IO.   Ramond Dial, PA-C 08/09/2016, 7:43 PM   Personally seen and examined. Agree with above.  82 year old female post TAVR with no significant coronary artery disease on recent pre-op  cardiac catheterization here post witnessed cardiac arrest. She was just discharged today from the hospital, had pulmonary edema likely a result of atrial fibrillation with rapid ventricular response, was adequately diuresed and felt much better on discharge earlier this morning. Electrolytes were normal at that time. Magnesium was normal. INR was therapeutic. Her ejection fraction post aortic valve replacement was 55%.  She was at home  and her family witnessed her lose consciousness. They immediately tried to  begin CPR. EMS was called and arrived to the house within 9-10 minutes. Fire department noticed ventricular fibrillation on arrival and perform 2 separate shocks and were able to provide normal rhythm. They also gave 1 round of epinephrine. She was quite hypotensive, EMS report systolic blood pressures in the 60s. Continued CPR.  Originally, she was called out as an ST elevation myocardial infarction based upon her intraventricular conduction delay without being able to compare to prior EKGs. Her original EKG here in the emergency room does not show any significant ST elevations. Atrial fibrillation with rapid ventricular response noted.  When I examined her, she was unresponsive, pupils were 10 point, fixed, no response to painful stimuli. Lactic acid level was 5.7. PH was 7.2  Consistent with metabolic acidosis. Bedside echocardiogram showed generalized diffuse reduction in ejection fraction of approximally 35-40% on bedside echo with dyskinetic anteroseptal wall/interventricular conduction delay.  Her family is currently in transit from [place].  Lengthy discussion with emergency department team as well as interventional cardiology team. Based upon her current lack of ST segment elevation and current bounding pulse with blood pressure of 160 systolic, and obtunded, comatose state with no specific neurologic response, elevated lactate, prolonged downtime we mutually decided that she would not be a cardiac catheterization candidate.   We have ordered an echocardiogram. Since she is not demonstrating any further arrhythmias, we held off from administering IV amiodarone. Critical care medicine will be seeing her. Unfortunately, prognosis seems poor. We will continue with continuous assessment.  Critical care time 1 hour spent with patient, emergency department team, discussion amongst interventional colleague, Dr. SwazilandJordan, review  of extensive medical records. She is critically ill.  Donato SchultzMark Terri Malerba, MD

## 2016-08-23 NOTE — H&P (Signed)
PULMONARY / CRITICAL CARE MEDICINE   Name: [name] MRN: [id number] DOB: [birth year]    ADMISSION DATE:  Feb 17, 2016 CONSULTATION DATE:  Feb 17, 2016  REFERRING MD: Dr. Dalene SeltzerSchlossman  CHIEF COMPLAINT: Cardiac arrest  HISTORY OF PRESENT ILLNESS:   82 year old female with extensive cardiac history with recent admission for TAVR, which was without complication and she recovered well from. She has been admitted twice since then with syncope and then pulmonary edema secondary to AF RVR. She is on coumadin. She was just discharged 11/22 from her AFRVR admission and was doing well, however, later that day while sitting at home and talking on the phone she became unresponsive. Witnessed by nephew who started CPR after calling EMS. 10 mins later EMS arrived and noted her to be in VF. She was defibrillated and given several rounds of epinephrine. Down-time estimated at about 30 mins. In ED she was intubated and found to have R sided pneumothorax. PCCM asked to admit.  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  PAST SURGICAL HISTORY: She  has a past surgical history that includes Shoulder surgery (12/07/2004); Pacemaker insertion (06/18/2008); Breast biopsy (02/22/2007 ); Cardioversion (05/16/2005); Esophagogastroduodenoscopy (03/12/2007 ); Cardiac catheterization (N/A, 06/14/2016); Abdominal hysterectomy; Appendectomy; Transcatheter aortic valve replacement, transfemoral (N/A, 07/04/2016); and TEE without cardioversion (N/A,  07/04/2016).  Allergies  Allergen Reactions  . Amiodarone Other (See Comments)    Stops her heart  . Aspirin Other (See Comments)    Gi bleeding  . Codeine Other (See Comments)    GI Lead  . Tramadol Nausea And Vomiting  . Lipitor [Atorvastatin Calcium] Other (See Comments)    Muscle Ache  . Pravachol Other (See Comments)    Muscle Ache    No current facility-administered medications on file prior to encounter.    Current Outpatient Prescriptions on File Prior to Encounter  Medication Sig  . amoxicillin (AMOXIL) 500 MG capsule Take 500 mg by mouth daily as needed (for dental appointments).   . calcium carbonate (OS-CAL) 600 MG TABS Take 1,200 mg by mouth 3 (three) times a week.   . digoxin (LANOXIN) 0.125 MG tablet Take 1 tablet (0.125 mg total) by mouth daily.  Marland Kitchen. diltiazem (CARDIZEM CD) 240 MG 24 hr capsule Take 1 capsule (240 mg total) by mouth daily.  . furosemide (LASIX) 40 MG tablet Begin taking 40mg  daily (Patient taking differently: Take 40 mg by mouth daily. )  . gabapentin (NEURONTIN) 300 MG capsule Take 300-900 mg by mouth See admin instructions. Take 300 mg by mouth in the morning and take 900 mg by mouth in the evening.  Marland Kitchen. levothyroxine (SYNTHROID, LEVOTHROID) 50 MCG tablet Take 50 mcg by mouth daily.    Marland Kitchen. lisinopril (PRINIVIL,ZESTRIL) 2.5 MG tablet Take 2.5 mg by mouth daily.  . metFORMIN (GLUCOPHAGE) 500 MG tablet Take 1,000 mg by mouth 2 (two) times daily.   . metoprolol (LOPRESSOR) 100 MG tablet TAKE ONE TABLET BY MOUTH TWICE DAILY  . Multiple Vitamins-Minerals (VISION-VITE PRESERVE PO) Take 1 capsule by mouth 2 (two) times daily.  . potassium chloride SA (K-DUR,KLOR-CON) 20 MEQ tablet Take 1 tablet (20 mEq total) by mouth 2 (two) times daily.  . psyllium (METAMUCIL)  58.6 % powder Take 1 packet by mouth daily.  . simvastatin (ZOCOR) 10 MG tablet Take 1 tablet (10 mg total) by mouth daily.  Marland Kitchen warfarin (COUMADIN) 2.5 MG tablet Take 0.5 to 1 tablet daily as directed by  Coumadin clinic. (Patient taking differently: Take 1.25-2.5 mg by mouth See admin instructions. Take 1/2 tablet on Wednesday then take 1 tablet all there other days)    FAMILY HISTORY:  Her indicated that her mother is deceased. She indicated that her father is deceased. She indicated that three of her seven sisters are deceased. She indicated that the status of her neg hx is unknown.    SOCIAL HISTORY: She  reports that she quit smoking about 55 years ago. Her smoking use included Cigarettes. She has a 10.00 pack-year smoking history. She has never used smokeless tobacco. She reports that she does not drink alcohol or use drugs.  REVIEW OF SYSTEMS:   unable  SUBJECTIVE:  unable  VITAL SIGNS: BP 119/78   Pulse (!) 126   Resp (!) 29   SpO2 100%   HEMODYNAMICS:    VENTILATOR SETTINGS: Vent Mode: PRVC FiO2 (%):  [60 %-100 %] 60 % Set Rate:  [16 bmp] 16 bmp Vt Set:  [500 mL] 500 mL PEEP:  [5 cmH20] 5 cmH20 Plateau Pressure:  [20 cmH20] 20 cmH20  INTAKE / OUTPUT: No intake/output data recorded.  PHYSICAL EXAMINATION: General:  Frail elderly female on vent Neuro:  Comatose, no pain response HEENT:  [place]/AT, PERRL, no JVD Cardiovascular:  RRR, no MRG Lungs:  Diminished R, clear otherwise, Crepitus R anterior chest Abdomen:  Soft, non-distended Musculoskeletal:  No acute deformity Skin:  Grossly intact, pallor.   LABS:  BMET  Recent Labs Lab 08/22/16 0238 14-Sep-2016 0239 14-Sep-2016 1949 2016/09/14 1951  NA 137 137 138 136  K 4.4 4.4 3.7 5.0  CL 101 102 113* 102  CO2 27 26 15*  --   BUN 14 14 16  25*  CREATININE 0.68 0.77 0.82 1.00  GLUCOSE 148* 162* 238* 293*    Electrolytes  Recent Labs Lab 08/22/16 0238 September 14, 2016 0239 2016-09-14 1949  CALCIUM 9.1 9.0 6.2*  MG 2.0 2.0 1.7    CBC  Recent Labs Lab 08/21/16 0650 08/22/16 0238 09-14-16 1949 09-14-16 1951  WBC 13.1* 10.2 14.3*  --   HGB 11.5* 10.4* 9.1* 11.9*  HCT 35.3* 32.3* 28.4* 35.0*  PLT 250 231 217   --     Coag's  Recent Labs Lab 08/12/2016 2344 08/22/16 0238 09-14-16 0239  APTT 55*  --   --   INR 4.31* 4.28* 3.01    Sepsis Markers  Recent Labs Lab 09/14/16 1952  LATICACIDVEN 5.71*    ABG  Recent Labs Lab 09-14-2016 2007  PHART 7.276*  PCO2ART 43.3  PO2ART 148.0*    Liver Enzymes  Recent Labs Lab 08/21/16 0650 09-14-16 1949  AST 31 72*  ALT 16 29  ALKPHOS 81 79  BILITOT 1.1 0.3  ALBUMIN 3.9 2.4*    Cardiac Enzymes  Recent Labs Lab 08/21/16 1131 08/21/16 1759 08/22/16 0238  TROPONINI 0.06* 0.05* 0.03*    Glucose  Recent Labs Lab 08/22/16 1157 08/22/16 1556 08/22/16 2138 09/14/2016 0354 09/14/2016 0800 09/14/2016 1213  GLUCAP 258* 243* 142* 156* 129* 222*    Imaging Dg Chest Portable 1 View  Result Date: 09-14-16 CLINICAL DATA:  Repositioning of ET tube EXAM: PORTABLE CHEST 1 VIEW COMPARISON:  09-14-16 FINDINGS: Interim repositioning of endotracheal tube, the tip is now 4.1  cm superior to the carina. Single lead left-sided pacing device remains in place. Valvular prosthesis again noted. Esophageal tube tip extends below the diaphragm. There is stable cardiomegaly. Hazy bilateral pulmonary infiltrates or edema, do not appear significantly changed. Interval increase in size of right-sided pneumothorax, demonstrating 2.2 cm of pleural-parenchymal separation at the apex. Pneumothorax is seen laterally as well as along the right cardiophrenic sulcus. Increased subcutaneous emphysema over the right lateral chest wall. IMPRESSION: 1. Support lines and tubes as described above. 2. No significant interval change in cardiomegaly and bilateral interstitial and alveolar infiltrates or edema, right greater than left. 3. Slight interval increase in size of small moderate right-sided pneumothorax. Electronically Signed   By: Jasmine Pang M.D.   On: 08/17/2016 21:00   Dg Chest Portable 1 View  Result Date: 08/02/2016 CLINICAL DATA:  Post intubation in CPR  EXAM: PORTABLE CHEST 1 VIEW COMPARISON:  08/22/2016 FINDINGS: Endotracheal to appears high above the thoracic inlet approximately 13 cm from the carina. Recommend advancing 6 cm. NG tube in the stomach. Moderate volume RIGHT pneumothorax measures 11 mm from chest wall and occupies approximately 10-15% of lung volume. IMPRESSION: 1. Moderate volume RIGHT pneumothorax. 2. Endotracheal tube is high.  Recommend advancement by 6 cm. Critical Value/emergent results were called by telephone at the time of interpretation on 08/08/2016 at 8:10 pm to Dr. Alvira Monday , who verbally acknowledged these results. Electronically Signed   By: Genevive Bi M.D.   On: 08/02/2016 20:11     STUDIES:  CT head 11/22 > EEG 11/23 > CXR admit 11/22 > R PTX  CULTURES: BCx2 11/22 > Urine 11/22 >  ANTIBIOTICS: Cefepime 11/22 > Vancomycin 11/22 >  SIGNIFICANT EVENTS: 11/22 discharge, subsequent cardiac arrest  LINES/TUBES: ETT 11/22 > R chest tube 11/22 > CVL 11/22 >  DISCUSSION: 82 year old female with cardiac history just discharged 11/22 presents same day after cardiac arrest 30 mins VF. R sided PTX CT placed in ED. Family wishing to be aggressive and will admit with TTM protocol on vent. Full code.   ASSESSMENT / PLAN:  PULMONARY A: Inability to protect airway secondary to cardiac arrest R sided small-mod PTX  P:   Full vent support Follow ABG CXR in AM ED to place chest tube CT to 20 cmH20 suction  CARDIOVASCULAR A:  Cardiac arrest Acute on chronic systolic CHF Atrial fibrillation with RVR S/p TAVR 07/2016 H/o AS, pacemaker, HTN, HLD  P:  Telemetry monitoring Ensure lactic clearing PRN levophed to keep MAP > 80 TTM protocol 33C Place CVL, Art line, Obtain CVP Cardiology following > no indication for cath or amiodarone at this time.  RENAL A:   No acute issues  P:   BMP every 2 hours  GASTROINTESTINAL A:   No acute issues  P:   Pepcid for SUP  HEMATOLOGIC A:    Anemia Warfarin coagulopathy  P:  Follow CBC, INR Hold warfarin, heparin per pharmacy  INFECTIOUS A:   Leukocytosis, potentially aspiration PNA vs HCAP (suspect this is edema on CXR)  P:   ABX as above PCT to help taper abx rapidly if able Follow cultures  ENDOCRINE A:   DM   P:   CBG monitoring and SSI  NEUROLOGIC A:   Acute anoxic encephalopathy P:   RASS goal: -4 Fentanyl and propofol Nimbex to prevent shivering. EGG  FAMILY  - Updates: family updated at length by PH/RA in ED. HOA driving from [place]  - Inter-disciplinary family meet or  Palliative Care meeting due by:  11/29    Joneen RoachPaul Hoffman, AGACNP-BC [facility] Pager [phone] or [phone]  08/16/2016 10:48 PM

## 2016-08-23 NOTE — ED Triage Notes (Signed)
Pt was a witnessed arrest in front of family , family started and continued Cpr for 9 mins , fire arrived and took over cpr , ems arrived , pt was in v fib was shocked twice and received 1 epi ,pulses back on arrival to the ED

## 2016-08-23 NOTE — ED Notes (Signed)
Ice bags  Applied  To groin and arm pits

## 2016-08-23 NOTE — ED Notes (Signed)
MD at bedside placing a chest tube

## 2016-08-23 NOTE — Progress Notes (Signed)
Pharmacy Antibiotic Note  Loma BostonMillie H Palm is a 82 y.o. female admitted on 08/20/2016 with pneumonia.  Pharmacy has been consulted for vancomycin and cefepime dosing. Pt just discharged from hospital earlier today.  Now s/p CPR at home by family x 9 minutes then CPR by fire dept: v fib, shock x 2, 1 epi given. Pt intubated in the field.  Total downtime ~ 30 minutes.  Wt 66.9 kg, WBC 14.3, creat 1, lactate 5.71, afebrile.   Plan: Vancomycin 1000 mg IV every 24 hours.  Goal trough 15-20 mcg/mL.  Cefepime 2 gm in ED then 1 gm IV q24 hrs F/u renal fxn, wbc, temp, culture data Vancomycin trough as needed   Temp (24hrs), Avg:97.5 F (36.4 C), Min:97.3 F (36.3 C), Max:98 F (36.7 C)   Recent Labs Lab 08/24/2016 2355 08/21/16 0650 08/22/16 0238 08/21/2016 0239 08/17/2016 1949 08/21/2016 1951 08/26/2016 1952  WBC 16.5* 13.1* 10.2  --  14.3*  --   --   CREATININE  --  0.70 0.68 0.77 0.82 1.00  --   LATICACIDVEN  --   --   --   --   --   --  5.71*    Estimated Creatinine Clearance: 37.1 mL/min (by C-G formula based on SCr of 1 mg/dL).    Allergies  Allergen Reactions  . Amiodarone Other (See Comments)    Stops her heart  . Aspirin Other (See Comments)    Gi bleeding  . Codeine Other (See Comments)    GI Lead  . Tramadol Nausea And Vomiting  . Lipitor [Atorvastatin Calcium] Other (See Comments)    Muscle Ache  . Pravachol Other (See Comments)    Muscle Ache   Herby AbrahamMichelle T. Ariyan Sinnett, Pharm.D. [phone] 08/25/2016 10:05 PM

## 2016-08-23 NOTE — ED Notes (Signed)
Artic sun pads applied  

## 2016-08-24 ENCOUNTER — Inpatient Hospital Stay (HOSPITAL_COMMUNITY): Payer: Medicare Other

## 2016-08-24 DIAGNOSIS — R748 Abnormal levels of other serum enzymes: Secondary | ICD-10-CM

## 2016-08-24 DIAGNOSIS — Z952 Presence of prosthetic heart valve: Secondary | ICD-10-CM

## 2016-08-24 DIAGNOSIS — I5033 Acute on chronic diastolic (congestive) heart failure: Secondary | ICD-10-CM

## 2016-08-24 LAB — POCT I-STAT, CHEM 8
BUN: 28 mg/dL — ABNORMAL HIGH (ref 6–20)
BUN: 26 mg/dL — AB (ref 6–20)
BUN: 26 mg/dL — AB (ref 6–20)
BUN: 24 mg/dL — ABNORMAL HIGH (ref 6–20)
BUN: 24 mg/dL — ABNORMAL HIGH (ref 6–20)
CALCIUM ION: 1.19 mmol/L (ref 1.15–1.40)
CALCIUM ION: 1.22 mmol/L (ref 1.15–1.40)
CALCIUM ION: 1.25 mmol/L (ref 1.15–1.40)
CALCIUM ION: 1.22 mmol/L (ref 1.15–1.40)
CALCIUM ION: 1.27 mmol/L (ref 1.15–1.40)
CHLORIDE: 99 mmol/L — AB (ref 101–111)
CHLORIDE: 101 mmol/L (ref 101–111)
CHLORIDE: 106 mmol/L (ref 101–111)
CREATININE: 0.8 mg/dL (ref 0.44–1.00)
CREATININE: 0.6 mg/dL (ref 0.44–1.00)
CREATININE: 0.6 mg/dL (ref 0.44–1.00)
Chloride: 101 mmol/L (ref 101–111)
Chloride: 104 mmol/L (ref 101–111)
Creatinine, Ser: 0.6 mg/dL (ref 0.44–1.00)
Creatinine, Ser: 0.6 mg/dL (ref 0.44–1.00)
GLUCOSE: 474 mg/dL — AB (ref 65–99)
GLUCOSE: 456 mg/dL — AB (ref 65–99)
GLUCOSE: 397 mg/dL — AB (ref 65–99)
GLUCOSE: 318 mg/dL — AB (ref 65–99)
GLUCOSE: 131 mg/dL — AB (ref 65–99)
HCT: 35 % — ABNORMAL LOW (ref 36.0–46.0)
HCT: 34 % — ABNORMAL LOW (ref 36.0–46.0)
HCT: 34 % — ABNORMAL LOW (ref 36.0–46.0)
HCT: 34 % — ABNORMAL LOW (ref 36.0–46.0)
HCT: 34 % — ABNORMAL LOW (ref 36.0–46.0)
HEMOGLOBIN: 11.6 g/dL — AB (ref 12.0–15.0)
HEMOGLOBIN: 11.6 g/dL — AB (ref 12.0–15.0)
HEMOGLOBIN: 11.6 g/dL — AB (ref 12.0–15.0)
Hemoglobin: 11.9 g/dL — ABNORMAL LOW (ref 12.0–15.0)
Hemoglobin: 11.6 g/dL — ABNORMAL LOW (ref 12.0–15.0)
POTASSIUM: 4 mmol/L (ref 3.5–5.1)
Potassium: 3.3 mmol/L — ABNORMAL LOW (ref 3.5–5.1)
Potassium: 3.5 mmol/L (ref 3.5–5.1)
Potassium: 3.4 mmol/L — ABNORMAL LOW (ref 3.5–5.1)
Potassium: 3.1 mmol/L — ABNORMAL LOW (ref 3.5–5.1)
SODIUM: 138 mmol/L (ref 135–145)
SODIUM: 142 mmol/L (ref 135–145)
Sodium: 137 mmol/L (ref 135–145)
Sodium: 138 mmol/L (ref 135–145)
Sodium: 138 mmol/L (ref 135–145)
TCO2: 21 mmol/L (ref 0–100)
TCO2: 21 mmol/L (ref 0–100)
TCO2: 21 mmol/L (ref 0–100)
TCO2: 22 mmol/L (ref 0–100)
TCO2: 21 mmol/L (ref 0–100)

## 2016-08-24 LAB — BASIC METABOLIC PANEL
ANION GAP: 15 (ref 5–15)
Anion gap: 15 (ref 5–15)
Anion gap: 17 — ABNORMAL HIGH (ref 5–15)
BUN: 25 mg/dL — ABNORMAL HIGH (ref 6–20)
BUN: 26 mg/dL — ABNORMAL HIGH (ref 6–20)
BUN: 25 mg/dL — AB (ref 6–20)
CHLORIDE: 101 mmol/L (ref 101–111)
CHLORIDE: 101 mmol/L (ref 101–111)
CHLORIDE: 103 mmol/L (ref 101–111)
CO2: 17 mmol/L — ABNORMAL LOW (ref 22–32)
CO2: 19 mmol/L — ABNORMAL LOW (ref 22–32)
CO2: 20 mmol/L — ABNORMAL LOW (ref 22–32)
Calcium: 8.8 mg/dL — ABNORMAL LOW (ref 8.9–10.3)
Calcium: 8.9 mg/dL (ref 8.9–10.3)
Calcium: 9.4 mg/dL (ref 8.9–10.3)
Creatinine, Ser: 0.95 mg/dL (ref 0.44–1.00)
Creatinine, Ser: 0.95 mg/dL (ref 0.44–1.00)
Creatinine, Ser: 1.02 mg/dL — ABNORMAL HIGH (ref 0.44–1.00)
GFR calc non Af Amer: 48 mL/min — ABNORMAL LOW (ref >60)
GFR calc non Af Amer: 52 mL/min — ABNORMAL LOW (ref >60)
GFR calc non Af Amer: 52 mL/min — ABNORMAL LOW (ref >60)
GFR, EST AFRICAN AMERICAN: 56 mL/min — AB (ref >60)
Glucose, Bld: 318 mg/dL — ABNORMAL HIGH (ref 65–99)
Glucose, Bld: 476 mg/dL — ABNORMAL HIGH (ref 65–99)
Glucose, Bld: 478 mg/dL — ABNORMAL HIGH (ref 65–99)
POTASSIUM: 3.3 mmol/L — AB (ref 3.5–5.1)
POTASSIUM: 3.8 mmol/L (ref 3.5–5.1)
POTASSIUM: 4.4 mmol/L (ref 3.5–5.1)
SODIUM: 135 mmol/L (ref 135–145)
SODIUM: 136 mmol/L (ref 135–145)
SODIUM: 137 mmol/L (ref 135–145)

## 2016-08-24 LAB — POCT I-STAT 3, ART BLOOD GAS (G3+)
ACID-BASE DEFICIT: 5 mmol/L — AB (ref 0.0–2.0)
Bicarbonate: 21.4 mmol/L (ref 20.0–28.0)
O2 Saturation: 98 %
PH ART: 7.349 — AB (ref 7.350–7.450)
PO2 ART: 94 mmHg (ref 83.0–108.0)
TCO2: 23 mmol/L (ref 0–100)
pCO2 arterial: 36.7 mmHg (ref 32.0–48.0)

## 2016-08-24 LAB — GLUCOSE, CAPILLARY
GLUCOSE-CAPILLARY: 435 mg/dL — AB (ref 65–99)
GLUCOSE-CAPILLARY: 450 mg/dL — AB (ref 65–99)
GLUCOSE-CAPILLARY: 368 mg/dL — AB (ref 65–99)
GLUCOSE-CAPILLARY: 368 mg/dL — AB (ref 65–99)
GLUCOSE-CAPILLARY: 337 mg/dL — AB (ref 65–99)
GLUCOSE-CAPILLARY: 290 mg/dL — AB (ref 65–99)
GLUCOSE-CAPILLARY: 169 mg/dL — AB (ref 65–99)
Glucose-Capillary: 342 mg/dL — ABNORMAL HIGH (ref 65–99)
Glucose-Capillary: 452 mg/dL — ABNORMAL HIGH (ref 65–99)
Glucose-Capillary: 457 mg/dL — ABNORMAL HIGH (ref 65–99)
Glucose-Capillary: 411 mg/dL — ABNORMAL HIGH (ref 65–99)
Glucose-Capillary: 418 mg/dL — ABNORMAL HIGH (ref 65–99)
Glucose-Capillary: 302 mg/dL — ABNORMAL HIGH (ref 65–99)
Glucose-Capillary: 245 mg/dL — ABNORMAL HIGH (ref 65–99)
Glucose-Capillary: 218 mg/dL — ABNORMAL HIGH (ref 65–99)
Glucose-Capillary: 132 mg/dL — ABNORMAL HIGH (ref 65–99)
Glucose-Capillary: 96 mg/dL (ref 65–99)

## 2016-08-24 LAB — PHOSPHORUS: PHOSPHORUS: 4.4 mg/dL (ref 2.5–4.6)

## 2016-08-24 LAB — TROPONIN I
TROPONIN I: 0.05 ng/mL — AB (ref <0.03)
Troponin I: 0.05 ng/mL (ref <0.03)
Troponin I: 0.07 ng/mL (ref <0.03)
Troponin I: 0.07 ng/mL (ref <0.03)

## 2016-08-24 LAB — MAGNESIUM: Magnesium: 1.9 mg/dL (ref 1.7–2.4)

## 2016-08-24 LAB — MRSA PCR SCREENING: MRSA by PCR: NEGATIVE

## 2016-08-24 LAB — CBC
HCT: 33.6 % — ABNORMAL LOW (ref 36.0–46.0)
Hemoglobin: 10.9 g/dL — ABNORMAL LOW (ref 12.0–15.0)
MCH: 29.1 pg (ref 26.0–34.0)
MCHC: 32.4 g/dL (ref 30.0–36.0)
MCV: 89.6 fL (ref 78.0–100.0)
PLATELETS: 298 10*3/uL (ref 150–400)
RBC: 3.75 MIL/uL — AB (ref 3.87–5.11)
RDW: 14.1 % (ref 11.5–15.5)
WBC: 19.6 10*3/uL — AB (ref 4.0–10.5)

## 2016-08-24 LAB — APTT: aPTT: 43 seconds — ABNORMAL HIGH (ref 24–36)

## 2016-08-24 LAB — PROTIME-INR
INR: 2.2
PROTHROMBIN TIME: 24.8 s — AB (ref 11.4–15.2)

## 2016-08-24 MED ORDER — AMIODARONE LOAD VIA INFUSION
150.0000 mg | Freq: Once | INTRAVENOUS | Status: AC
  Administered 2016-08-24: 150 mg via INTRAVENOUS

## 2016-08-24 MED ORDER — AMIODARONE LOAD VIA INFUSION
150.0000 mg | Freq: Once | INTRAVENOUS | Status: DC
  Filled 2016-08-24: qty 83.34

## 2016-08-24 MED ORDER — CHLORHEXIDINE GLUCONATE 0.12% ORAL RINSE (MEDLINE KIT)
15.0000 mL | Freq: Two times a day (BID) | OROMUCOSAL | Status: DC
  Administered 2016-08-24 – 2016-08-27 (×7): 15 mL via OROMUCOSAL

## 2016-08-24 MED ORDER — AMIODARONE HCL IN DEXTROSE 360-4.14 MG/200ML-% IV SOLN
30.0000 mg/h | INTRAVENOUS | Status: DC
  Administered 2016-08-25: 150 mg/h via INTRAVENOUS
  Administered 2016-08-25 – 2016-08-27 (×3): 30 mg/h via INTRAVENOUS
  Filled 2016-08-24 (×6): qty 200

## 2016-08-24 MED ORDER — NOREPINEPHRINE BITARTRATE 1 MG/ML IV SOLN
0.0000 ug/min | INTRAVENOUS | Status: DC
  Administered 2016-08-24: 28 ug/min via INTRAVENOUS
  Administered 2016-08-24: 24 ug/min via INTRAVENOUS
  Administered 2016-08-25: 33 ug/min via INTRAVENOUS
  Administered 2016-08-25: 29 ug/min via INTRAVENOUS
  Administered 2016-08-26: 6 ug/min via INTRAVENOUS
  Administered 2016-08-26: 27 ug/min via INTRAVENOUS
  Filled 2016-08-24 (×6): qty 16

## 2016-08-24 MED ORDER — AMIODARONE HCL IN DEXTROSE 360-4.14 MG/200ML-% IV SOLN: 30.0000 mg/h | INTRAVENOUS | Status: DC

## 2016-08-24 MED ORDER — AMIODARONE HCL IN DEXTROSE 360-4.14 MG/200ML-% IV SOLN
60.0000 mg/h | INTRAVENOUS | Status: AC
  Administered 2016-08-25: 60 mg/h via INTRAVENOUS
  Filled 2016-08-24: qty 200

## 2016-08-24 MED ORDER — INSULIN ASPART 100 UNIT/ML ~~LOC~~ SOLN
2.0000 [IU] | SUBCUTANEOUS | Status: DC
  Administered 2016-08-25 (×2): 6 [IU] via SUBCUTANEOUS

## 2016-08-24 MED ORDER — FUROSEMIDE 10 MG/ML IJ SOLN
20.0000 mg | Freq: Two times a day (BID) | INTRAMUSCULAR | Status: DC
  Administered 2016-08-24 – 2016-08-26 (×5): 20 mg via INTRAVENOUS
  Filled 2016-08-24 (×5): qty 2

## 2016-08-24 MED ORDER — AMIODARONE HCL IN DEXTROSE 360-4.14 MG/200ML-% IV SOLN
60.0000 mg/h | INTRAVENOUS | Status: DC
  Filled 2016-08-24 (×2): qty 200

## 2016-08-24 MED ORDER — VANCOMYCIN HCL IN DEXTROSE 1-5 GM/200ML-% IV SOLN
1000.0000 mg | INTRAVENOUS | Status: DC
  Filled 2016-08-24: qty 200

## 2016-08-24 MED ORDER — DEXTROSE 5 % IV SOLN
2.0000 g | Freq: Once | INTRAVENOUS | Status: AC
  Administered 2016-08-24: 2 g via INTRAVENOUS
  Filled 2016-08-24: qty 2

## 2016-08-24 MED ORDER — SODIUM CHLORIDE 0.9 % IV SOLN
INTRAVENOUS | Status: DC
  Administered 2016-08-24: 3.9 [IU]/h via INTRAVENOUS
  Administered 2016-08-26: 1.5 [IU]/h via INTRAVENOUS
  Filled 2016-08-24 (×3): qty 2.5

## 2016-08-24 MED ORDER — DEXTROSE 5 % IV SOLN
1.0000 g | INTRAVENOUS | Status: DC
  Administered 2016-08-25 – 2016-08-27 (×3): 1 g via INTRAVENOUS
  Filled 2016-08-24 (×3): qty 1

## 2016-08-24 MED ORDER — DEXTROSE 10 % IV SOLN: INTRAVENOUS | Status: DC | PRN

## 2016-08-24 MED ORDER — VANCOMYCIN HCL IN DEXTROSE 1-5 GM/200ML-% IV SOLN
1000.0000 mg | INTRAVENOUS | Status: DC
  Administered 2016-08-24 – 2016-08-25 (×2): 1000 mg via INTRAVENOUS
  Filled 2016-08-24 (×4): qty 200

## 2016-08-24 MED ORDER — POTASSIUM CHLORIDE 20 MEQ/15ML (10%) PO SOLN
40.0000 meq | Freq: Once | ORAL | Status: AC
  Administered 2016-08-24: 40 meq
  Filled 2016-08-24: qty 30

## 2016-08-24 MED ORDER — ORAL CARE MOUTH RINSE
15.0000 mL | OROMUCOSAL | Status: DC
  Administered 2016-08-24 – 2016-08-27 (×31): 15 mL via OROMUCOSAL

## 2016-08-24 MED ORDER — INSULIN GLARGINE 100 UNIT/ML ~~LOC~~ SOLN
20.0000 [IU] | Freq: Every day | SUBCUTANEOUS | Status: DC
  Administered 2016-08-25: 20 [IU] via SUBCUTANEOUS
  Filled 2016-08-24 (×2): qty 0.2

## 2016-08-24 MED ORDER — DEXTROSE 5 % IV SOLN
1.0000 g | INTRAVENOUS | Status: DC
  Filled 2016-08-24: qty 1

## 2016-08-24 NOTE — Progress Notes (Signed)
  Amiodarone Drug - Drug Interaction Consult Note  Recommendations: Monitor for cardiotoxicity while on diuretic. Several interacting medications on PTA med list (see below) - none of these currently ordered inpatient.  Amiodarone is metabolized by the cytochrome P450 system and therefore has the potential to cause many drug interactions. Amiodarone has an average plasma half-life of 50 days (range 20 to 100 days).   There is potential for drug interactions to occur several weeks or months after stopping treatment and the onset of drug interactions may be slow after initiating amiodarone.   [x]  Statins: Increased risk of myopathy. Simvastatin- restrict dose to 20mg  daily. Other statins: counsel patients to report any muscle pain or weakness immediately. Simvastatin 10mg  daily PTA, not ordered inpatient currently  [x]  Anticoagulants: Amiodarone can increase anticoagulant effect. Consider  warfarin dose reduction. Patients should be monitored closely and the dose of  anticoagulant altered accordingly, remembering that amiodarone levels take  several weeks to stabilize.  Warfarin PTA, not ordered inpatient currently   []  Antiepileptics: Amiodarone can increase plasma concentration of phenytoin, the dose should be reduced. Note that small changes in phenytoin dose can result in large changes in levels. Monitor patient and counsel on signs of toxicity.  [x]  Beta blockers: increased risk of bradycardia, AV block and myocardial depression. Sotalol - avoid concomitant use.   Lopressor PTA, not ordered inpatient currently  [x]   Calcium channel blockers (diltiazem and verapamil): increased risk of bradycardia, AV block and myocardial depression.  Diltiazem PTA, not ordered inpatient currently  []   Cyclosporine: Amiodarone increases levels of cyclosporine. Reduced dose of cyclosporine is recommended.  [x]  Digoxin dose should be halved when amiodarone is started.  Digoxin on PTA med rec, not ordered  inpatient currently  [x]  Diuretics: increased risk of cardiotoxicity if hypokalemia occurs.  Lasix IV inpatient  [x]  Oral hypoglycemic agents (glyburide, glipizide, glimepiride): increased risk of hypoglycemia. Patient's glucose levels should be monitored closely when initiating amiodarone therapy.  Metformin PTA, not ordered inpatient currently  []  Drugs that prolong the QT interval:  Torsades de pointes risk may be increased with concurrent use - avoid if possible.  Monitor QTc, also keep magnesium/potassium WNL if concurrent therapy can't be avoided. Marland Kitchen. Antibiotics: e.g. fluoroquinolones, erythromycin. . Antiarrhythmics: e.g. quinidine, procainamide, disopyramide, sotalol. . Antipsychotics: e.g. phenothiazines, haloperidol.  . Lithium, tricyclic antidepressants, and methadone. Thank You,    Babs BertinHaley Cormick Moss, PharmD, BCPS Clinical Pharmacist 08/24/2016 10:06 PM

## 2016-08-24 NOTE — Progress Notes (Signed)
EEG completed; results pending.    

## 2016-08-24 NOTE — ED Provider Notes (Signed)
[facility]-EMERGENCY DEPT Provider Note   CSN: [id number] Arrival date & time: 08/26/2016  1933     History   Chief Complaint Chief Complaint  Patient presents with  . Cardiac Arrest    HPI [name] is a 81 y.o. female.  HPI   A 8 old female with a history of diabetes, congestive heart failure, atrial fibrillation, pacemaker in place, aortic stenosis s/p TAVR, tachy-brady syndrome, discharged from hospital today with concern for cough and shortness of breath, with atrial fibrillation and RVR presents with cardiac arrest. Patient was witnessed arrest by family, with grandson initiating CPR for approximately 9 minutes prior to fire arrival. On EMS arrival, patient was interventricular ventricular fibrillation and was defibrillated 2, received epi 1 with return of spontaneous circulation. She was intubated without sedation and required no sedation en route. A code STEMI was initiated given concern for V. fib arrest for rapid cardiology consult.     Past Medical History:  Diagnosis Date  . Arthritis   . Chronic diastolic congestive heart failure (HCC)   . Diabetes mellitus   . History of peptic ulcer disease   . Hypercholesteremia   . Hyperlipidemia   . Hypertension   . Hypothyroidism   . Peripheral neuropathy (HCC)   . Permanent atrial fibrillation (HCC)   . Presence of permanent cardiac pacemaker   . 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 Severe aortic stenosis   . Tachycardia-bradycardia syndrome (HCC)    a. s/p SJM single chamber PPM implanted by Dr Reyes Ivan 2009  . Thrombocytopenia (HCC)   . Thyroid disease     Patient Active Problem List   Diagnosis Date Noted  . Cardiac arrest (HCC) 08/22/2016  . Chronic diastolic CHF (congestive heart failure) (HCC)   . Uncontrolled type 2 diabetes mellitus with complication (HCC)   . Other specified  hypothyroidism   . Pneumothorax, right   . Atrial fibrillation with RVR (HCC) 08/21/2016  . Acute respiratory failure with hypoxia (HCC) 08/21/2016  . Near syncope 07/15/2016  . Fall   . Syncope 07/14/2016  . Current use of long term anticoagulation   . 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 Chronic diastolic congestive heart failure (HCC)   . Cerebrovascular disease 12/21/2015  . St. Jude pacer 12/21/2015  . Osteoarthritis 11/24/2013  . Left carotid bruit 06/11/2013  . Type II or unspecified type diabetes mellitus without mention of complication, uncontrolled 03/08/2011  . Hypertensive heart disease   . Hyperlipidemia 10/28/2010  . Chronic atrial fibrillation (HCC)   . Tachycardia-bradycardia syndrome [facility])     Past Surgical History:  Procedure Laterality Date  . ABDOMINAL HYSTERECTOMY    . APPENDECTOMY    . BREAST BIOPSY  02/22/2007    left  . CARDIAC CATHETERIZATION N/A 06/14/2016   Procedure: Left Heart Cath and Coronary Angiography;  Surgeon: Tonny Bollman, MD;  Location: [facility] INVASIVE CV LAB;  Service: Cardiovascular;  Laterality: N/A;  . CARDIOVERSION  05/16/2005   Electrical cardioversion   . ESOPHAGOGASTRODUODENOSCOPY  03/12/2007    Esophagogastroduodenoscopy with control of bleeding  . PACEMAKER INSERTION  06/18/2008   SJM Zephyr XL SR implanted by Dr Reyes Ivan  . SHOULDER SURGERY  12/07/2004  . TEE WITHOUT CARDIOVERSION N/A 07/04/2016   Procedure: TRANSESOPHAGEAL ECHOCARDIOGRAM (TEE);  Surgeon: Tonny Bollman, MD;  Location: [facility] OR;  Service: Open Heart Surgery;  Laterality: N/A;  . TRANSCATHETER AORTIC  VALVE REPLACEMENT, TRANSFEMORAL N/A 07/04/2016   Procedure: TRANSCATHETER AORTIC VALVE REPLACEMENT, TRANSFEMORAL;  Surgeon: Tonny BollmanMichael Cooper, MD;  [facility];  Service: Open Heart Surgery;  Laterality: N/A;    OB History    No data available       Home Medications    Prior to Admission medications   Medication Sig Start Date End Date  Taking? Authorizing Provider  amoxicillin (AMOXIL) 500 MG capsule Take 500 mg by mouth daily as needed (for dental appointments).  09/13/11   Historical Provider, MD  calcium carbonate (OS-CAL) 600 MG TABS Take 1,200 mg by mouth 3 (three) times a week.     Historical Provider, MD  digoxin (LANOXIN) 0.125 MG tablet Take 1 tablet (0.125 mg total) by mouth daily. 07/16/16   Arty BaumgartnerLindsay B Roberts, NP  diltiazem (CARDIZEM CD) 240 MG 24 hr capsule Take 1 capsule (240 mg total) by mouth daily. 07/16/16   Arty BaumgartnerLindsay B Roberts, NP  furosemide (LASIX) 40 MG tablet Begin taking 40mg  daily Patient taking differently: Take 40 mg by mouth daily.  07/15/16   Arty BaumgartnerLindsay B Roberts, NP  gabapentin (NEURONTIN) 300 MG capsule Take 300-900 mg by mouth See admin instructions. Take 300 mg by mouth in the morning and take 900 mg by mouth in the evening. 01/23/11   Historical Provider, MD  levothyroxine (SYNTHROID, LEVOTHROID) 50 MCG tablet Take 50 mcg by mouth daily.      Historical Provider, MD  lisinopril (PRINIVIL,ZESTRIL) 2.5 MG tablet Take 2.5 mg by mouth daily. 08/14/12   Historical Provider, MD  metFORMIN (GLUCOPHAGE) 500 MG tablet Take 1,000 mg by mouth 2 (two) times daily.  11/25/14   Historical Provider, MD  metoprolol (LOPRESSOR) 100 MG tablet TAKE ONE TABLET BY MOUTH TWICE DAILY 08/14/16   Tonny BollmanMichael Cooper, MD  Multiple Vitamins-Minerals (VISION-VITE PRESERVE PO) Take 1 capsule by mouth 2 (two) times daily.    Historical Provider, MD  potassium chloride SA (K-DUR,KLOR-CON) 20 MEQ tablet Take 1 tablet (20 mEq total) by mouth 2 (two) times daily. 07/07/16   Little IshikawaErin E Smith, NP  psyllium (METAMUCIL) 58.6 % powder Take 1 packet by mouth daily.    Historical Provider, MD  simvastatin (ZOCOR) 10 MG tablet Take 1 tablet (10 mg total) by mouth daily. 07/08/16   Little IshikawaErin E Smith, NP  warfarin (COUMADIN) 2.5 MG tablet Take 0.5 to 1 tablet daily as directed by Coumadin clinic. Patient taking differently: Take 1.25-2.5 mg by mouth See admin  instructions. Take 1/2 tablet on Wednesday then take 1 tablet all there other days 08/17/16   Lewayne BuntingBrian S Crenshaw, MD    Family History Family History  Problem Relation Age of Onset  . Heart disease Father   . Emphysema Father   . Heart failure Father   . Coronary artery disease Father   . Stroke Sister     cerebral hemorrhage  . Breast cancer Sister   . Heart disease Sister   . Heart failure Sister   . Heart attack Neg Hx     Social History Social History  Substance Use Topics  . Smoking status: Former Smoker    Packs/day: 1.00    Years: 10.00    Types: Cigarettes    Quit date: 03/06/1961  . Smokeless tobacco: Never Used  . Alcohol use No     Allergies   Amiodarone; Aspirin; Codeine; Tramadol; Lipitor [atorvastatin calcium]; and Pravachol   Review of Systems Review of Systems  Unable to perform ROS: Patient unresponsive  Physical Exam Updated Vital Signs BP 140/84   Pulse (!) 127   Temp (!) 91.2 F (32.9 C) (Core (Comment))   Resp 17   Ht 5\' 6"  (1.676 m)   Wt 154 lb 5.2 oz (70 kg)   SpO2 100%   BMI 24.91 kg/m   Physical Exam  Constitutional: She is oriented to person, place, and time. She appears well-developed and well-nourished. She has a sickly appearance. She appears ill. She is intubated.  HENT:  Head: Normocephalic and atraumatic.  Eyes: Conjunctivae and EOM are normal.  Neck: Normal range of motion.  Cardiovascular: Regular rhythm, normal heart sounds and intact distal pulses.  Tachycardia present.  Exam reveals no gallop and no friction rub.   No murmur heard. Pulmonary/Chest: She is intubated. She has no wheezes. She has rhonchi. She has rales.  Abdominal: Soft. She exhibits no distension. There is no tenderness. There is no guarding.  Musculoskeletal: She exhibits no edema or tenderness.  Neurological: She is oriented to person, place, and time. She is unresponsive.  Not localizing or withdrawing from pain, eyes open however  unresponsive Pupils 3mm, minimally reactive  Skin: Skin is warm and dry. No rash noted. She is not diaphoretic. No erythema.  Nursing note and vitals reviewed.    ED Treatments / Results  Labs (all labs ordered are listed, but only abnormal results are displayed) Labs Reviewed  CBC WITH DIFFERENTIAL/PLATELET - Abnormal; Notable for the following:       Result Value   WBC 14.3 (*)    RBC 3.07 (*)    Hemoglobin 9.1 (*)    HCT 28.4 (*)    Neutro Abs 9.2 (*)    All other components within normal limits  COMPREHENSIVE METABOLIC PANEL - Abnormal; Notable for the following:    Chloride 113 (*)    CO2 15 (*)    Glucose, Bld 238 (*)    Calcium 6.2 (*)    Total Protein 4.6 (*)    Albumin 2.4 (*)    AST 72 (*)    All other components within normal limits  BRAIN NATRIURETIC PEPTIDE - Abnormal; Notable for the following:    B Natriuretic Peptide 328.8 (*)    All other components within normal limits  TROPONIN I - Abnormal; Notable for the following:    Troponin I 0.07 (*)    All other components within normal limits  BASIC METABOLIC PANEL - Abnormal; Notable for the following:    CO2 17 (*)    Glucose, Bld 318 (*)    BUN 25 (*)    Creatinine, Ser 1.02 (*)    GFR calc non Af Amer 48 (*)    GFR calc Af Amer 56 (*)    Anion gap 17 (*)    All other components within normal limits  URINALYSIS, ROUTINE W REFLEX MICROSCOPIC (NOT AT [facility]) - Abnormal; Notable for the following:    Color, Urine AMBER (*)    APPearance CLOUDY (*)    Glucose, UA >1000 (*)    Hgb urine dipstick SMALL (*)    Ketones, ur 15 (*)    Protein, ur 100 (*)    All other components within normal limits  URINE MICROSCOPIC-ADD ON - Abnormal; Notable for the following:    Squamous Epithelial / LPF 0-5 (*)    Bacteria, UA RARE (*)    Casts GRANULAR CAST (*)    All other components within normal limits  GLUCOSE, CAPILLARY - Abnormal; Notable for the following:  Glucose-Capillary 342 (*)    All other components  within normal limits  I-STAT CG4 LACTIC ACID, ED - Abnormal; Notable for the following:    Lactic Acid, Venous 5.71 (*)    All other components within normal limits  I-STAT CHEM 8, ED - Abnormal; Notable for the following:    BUN 25 (*)    Glucose, Bld 293 (*)    Calcium, Ion 1.07 (*)    Hemoglobin 11.9 (*)    HCT 35.0 (*)    All other components within normal limits  I-STAT ARTERIAL BLOOD GAS, ED - Abnormal; Notable for the following:    pH, Arterial 7.276 (*)    pO2, Arterial 148.0 (*)    Acid-base deficit 6.0 (*)    All other components within normal limits  I-STAT CG4 LACTIC ACID, ED - Abnormal; Notable for the following:    Lactic Acid, Venous 4.48 (*)    All other components within normal limits  CULTURE, BLOOD (ROUTINE X 2)  CULTURE, BLOOD (ROUTINE X 2)  MAGNESIUM  BLOOD GAS, ARTERIAL  TROPONIN I  TROPONIN I  BASIC METABOLIC PANEL  BASIC METABOLIC PANEL  BASIC METABOLIC PANEL  BASIC METABOLIC PANEL  BASIC METABOLIC PANEL  PROTIME-INR  APTT  BLOOD GAS, ARTERIAL  BLOOD GAS, ARTERIAL  CBC  BLOOD GAS, ARTERIAL  PHOSPHORUS  MAGNESIUM  TROPONIN I  BASIC METABOLIC PANEL  I-STAT TROPOININ, ED    EKG  EKG Interpretation  Date/Time:  Wednesday August 23 2016 19:37:45 EST Ventricular Rate:  143 PR Interval:    QRS Duration: 136 QT Interval:  352 QTC Calculation: 543 R Axis:   -69 Text Interpretation:  Wide-QRS tachycardia, suspect atrial fibrillation with similar wide complex to previous IVCD, consider atypical RBBB No significant change since last tracing Confirmed by Henrietta D Good[facility] HospitalCHLOSSMAN MD, Kinza Gouveia ([id number]) on 08/24/2016 12:13:25 AM       Radiology Dg Chest Portable 1 View  Result Date: 10/04/15 CLINICAL DATA:  Chest tube placement.  Initial encounter. EXAM: PORTABLE CHEST 1 VIEW COMPARISON:  Chest radiograph performed earlier today at 8:40 p.m. FINDINGS: The patient's endotracheal tube is seen ending 4-5 cm above the carina. An enteric tube is noted extending below  the diaphragm. A right-sided chest tube is noted. No significant pneumothorax is seen. Worsening bilateral airspace opacification may reflect pneumonia or pulmonary edema. ARDS is a concern. No definite pleural effusion is seen. The cardiomediastinal silhouette is borderline enlarged. A pacemaker is noted overlying the left chest wall, with a lead ending overlying the right ventricle. A vascular stent is noted overlying the heart. Prominent soft tissue air is noted bilaterally, significantly increased on the right and tracking into the neck. External pacing pads are noted. IMPRESSION: 1. Endotracheal tube seen ending 4-5 cm above the carina. 2. Right-sided chest tubes noted, without definite evidence of pneumothorax. 3. Worsening bilateral airspace opacification may reflect pneumonia or pulmonary edema. ARDS is a concern. 4. Borderline cardiomegaly. 5. Significantly worsened right-sided soft tissue air, tracking into the neck, status post placement of right-sided chest tube. Electronically Signed   By: Roanna RaiderJeffery  Chang M.D.   On: 10/04/15 23:43   Dg Chest Portable 1 View  Result Date: 10/04/15 CLINICAL DATA:  Repositioning of ET tube EXAM: PORTABLE CHEST 1 VIEW COMPARISON:  10/04/15 FINDINGS: Interim repositioning of endotracheal tube, the tip is now 4.1 cm superior to the carina. Single lead left-sided pacing device remains in place. Valvular prosthesis again noted. Esophageal tube tip extends below the diaphragm. There is stable cardiomegaly. Hazy  bilateral pulmonary infiltrates or edema, do not appear significantly changed. Interval increase in size of right-sided pneumothorax, demonstrating 2.2 cm of pleural-parenchymal separation at the apex. Pneumothorax is seen laterally as well as along the right cardiophrenic sulcus. Increased subcutaneous emphysema over the right lateral chest wall. IMPRESSION: 1. Support lines and tubes as described above. 2. No significant interval change in cardiomegaly and  bilateral interstitial and alveolar infiltrates or edema, right greater than left. 3. Slight interval increase in size of small moderate right-sided pneumothorax. Electronically Signed   By: Jasmine Pang M.D.   On: 09-06-2016 21:00   Dg Chest Portable 1 View  Result Date: 2016/09/06 CLINICAL DATA:  Post intubation in CPR EXAM: PORTABLE CHEST 1 VIEW COMPARISON:  08/22/2016 FINDINGS: Endotracheal to appears high above the thoracic inlet approximately 13 cm from the carina. Recommend advancing 6 cm. NG tube in the stomach. Moderate volume RIGHT pneumothorax measures 11 mm from chest wall and occupies approximately 10-15% of lung volume. IMPRESSION: 1. Moderate volume RIGHT pneumothorax. 2. Endotracheal tube is high.  Recommend advancement by 6 cm. Critical Value/emergent results were called by telephone at the time of interpretation on 09/06/2016 at 8:10 pm to Dr. Alvira Monday , who verbally acknowledged these results. Electronically Signed   By: Genevive Bi M.D.   On: Sep 06, 2016 20:11   Dg Chest Port 1 View  Result Date: 08/22/2016 CLINICAL DATA:  Follow-up pleural effusions.  Subsequent encounter. EXAM: PORTABLE CHEST 1 VIEW COMPARISON:  Chest radiograph performed 08/21/2016 FINDINGS: A small left pleural effusion is again noted, similar in appearance to the prior study. The right-sided pleural effusion appears to have largely resolved. Vascular congestion is noted. Increased interstitial markings may reflect mild interstitial edema or possibly pneumonia. No pneumothorax is seen. The cardiomediastinal silhouette is borderline normal in size. A stent is noted overlying the heart. A pacemaker is noted overlying the left chest wall, with a lead ending overlying the right ventricle. No acute osseous abnormalities are seen. Degenerative change is noted at the right glenohumeral joint. IMPRESSION: Small left pleural effusion, similar in appearance to the prior study. Right-sided pleural effusion appears  to have largely resolved. Vascular congestion noted. Increased interstitial markings may reflect mild interstitial edema or possibly pneumonia. Electronically Signed   By: Roanna Raider M.D.   On: 08/22/2016 03:34    Procedures Procedures (including critical care time)  Medications Ordered in ED Medications  sodium chloride 0.9 % bolus 1,000 mL (not administered)  calcium gluconate 1 g in sodium chloride 0.9 % 100 mL IVPB (1 g Intravenous Given 08/24/16 0152)  ceFEPIme (MAXIPIME) 2 g in dextrose 5 % 50 mL IVPB (not administered)  vancomycin (VANCOCIN) IVPB 1000 mg/200 mL premix (not administered)  ceFEPIme (MAXIPIME) 1 g in dextrose 5 % 50 mL IVPB (not administered)  vancomycin (VANCOCIN) IVPB 1000 mg/200 mL premix (not administered)  norepinephrine (LEVOPHED) 4 mg in dextrose 5 % 250 mL (0.016 mg/mL) infusion (15 mcg/min Intravenous New Bag/Given 08/24/16 0113)  cisatracurium (NIMBEX) bolus via infusion 6.7 mg (not administered)    And  cisatracurium (NIMBEX) 200 mg in sodium chloride 0.9 % 200 mL (1 mg/mL) infusion (1 mcg/kg/min  66.9 kg Intravenous New Bag/Given 08/24/16 0112)    And  cisatracurium (NIMBEX) bolus via infusion 3.3 mg (not administered)  artificial tears (LACRILUBE) ophthalmic ointment 1 application (not administered)  0.9 %  sodium chloride infusion (not administered)  fentaNYL (SUBLIMAZE) injection 50 mcg (not administered)  fentaNYL in NS (42mcg/ml) infusion-PREMIX (250 mcg/hr  Intravenous New Bag/Given 08/24/16 0112)  fentaNYL (SUBLIMAZE) bolus via infusion 25 mcg (not administered)  propofol (DIPRIVAN) 1000 MG/100ML infusion (35 mcg/kg/min  66.9 kg Intravenous New Bag/Given 08/24/16 0112)  famotidine (PEPCID) IVPB 20 mg premix (not administered)  insulin aspart (novoLOG) injection 2-6 Units (not administered)  0.9 %  sodium chloride infusion (not administered)  lidocaine (PF) (XYLOCAINE) 1 % injection (not administered)  midazolam (VERSED)  injection 2 mg (2 mg Intravenous Given 08/12/2016 2023)  lidocaine (PF) (XYLOCAINE) 1 % injection 30 mL (30 mLs Other Given by Other 08/19/2016 2326)   CRITICAL CARE: Cardiac arrest, post CPR Performed by: Rhae Lerner   Total critical care time: 45 minutes  Critical care time was exclusive of separately billable procedures and treating other patients.  Critical care was necessary to treat or prevent imminent or life-threatening deterioration.  Critical care was time spent personally by me on the following activities: development of treatment plan with patient and/or surrogate as well as nursing, discussions with consultants, evaluation of patient's response to treatment, examination of patient, obtaining history from patient or surrogate, ordering and performing treatments and interventions, ordering and review of laboratory studies, ordering and review of radiographic studies, pulse oximetry and re-evaluation of patient's condition.   Initial Impression / Assessment and Plan / ED Course  I have reviewed the triage vital signs and the nursing notes.  Pertinent labs & imaging results that were available during my care of the patient were reviewed by me and considered in my medical decision making (see chart for details).  Clinical Course    A 37 old female with a history of diabetes, congestive heart failure, atrial fibrillation, pacemaker in place, aortic stenosis s/p TAVR, tachy-brady syndrome, discharged from hospital today with concern for cough and shortness of breath, with atrial fibrillation and RVR presents with cardiac arrest. Patient was witnessed arrest by family, with grandson initiating CPR for approximately 9 minutes prior to fire arrival. On EMS arrival, patient was interventricular ventricular fibrillation and was defibrillated 2, received epi 1 with return of spontaneous circulation.   On arrival to the emergency department, cardiology was at bedside, evaluated  her EKG and clinical picture. Given patient unresponsive, no ST elevations, and suspect possible respiratory arrest, will not bring patient to the Cath Lab.  X-ray done shows endotracheal tube too high, and this was advanced. Extraocular shows either a right-sided pneumothorax. Awaited family discussions regarding goals of care prior to chest tube placement, as patient remained with normal blood pressures, and saturations appropriate on 60% FiO2.  Patient with hypokalemia on labs, was given calcium gluconate. Given some asymmetry of pulmonary edema, will cover for pneumonia. Suspect more likely patient had acute pulmonary edema which resulted in respiratory arrest, followed by ventricular fibrillation. Page critical care on patient's arrival to the emergency department given question of cooling and need for admission. Ice packs maintained until they responded and evaluated patient and officially called Code Cool. Family decided they would like to proceed with chest tube, and continued supportive care as the POA Waynetta Sandy is driving from [place]. Patient received versed for gagging but has not shown other meaningful movement at this time.  Chest tube placed by Dr. Myrtis Ser, please see additional procedure note. Pt admitted to critical care for further care.  Final Clinical Impressions(s) / ED Diagnoses   Final diagnoses:  Cardiac arrest (HCC)  Hypocalcemia  Acute pulmonary edema (HCC)  Traumatic pneumothorax following CPR  Lactic acidosis    New Prescriptions Current Discharge Medication List  Alvira Monday, MD 08/24/16 [phone]

## 2016-08-24 NOTE — Progress Notes (Signed)
eLink Physician-Brief Progress Note Patient Name: [name] DOB: [birth year] MRN: [id number]   Date of Service  08/24/2016  HPI/Events of Note  Nurse reports k=3.3 but I do NOT see official results in chem panel  eICU Interventions  Await final K results     Intervention Category Major Interventions: Electrolyte abnormality - evaluation and management  Gillian Meeuwsen 08/24/2016, 6:49 AM

## 2016-08-24 NOTE — Progress Notes (Signed)
RT note: Arterial line s/u, initial attempt for placement unsuccessful @ left radial due to unable to thread catheter. RT unavailable for second attempt, needed for Intubation in another room, receiving RT made aware prior to transporting pt. to 4N-23 which was uneventful.

## 2016-08-24 NOTE — Progress Notes (Signed)
MD and family discussed patient allergy to Amiodarone. Family spoke with other members and determined all of patients sisters and relative females have had "issues when they have taken Amiodarone". Family is apprehensive to start medication. Dunn, PA,  paged and made aware. PA discussed with Dr. Delton SeeNelson. Order given to hold off on starting Amiodarone gtt at this time. Will continue to monitor closely.

## 2016-08-24 NOTE — Progress Notes (Signed)
Portable head CT ordered. Spoke with Radiology department, unable to complete portable head CT due to missing parts from the equipment.

## 2016-08-24 NOTE — Progress Notes (Signed)
eLink Physician-Brief Progress Note Patient Name: Loma BostonMillie H Ashraf DOB: Mar 08, 1927 MRN: [id number]   Date of Service  08/24/2016  HPI/Events of Note  Other family members had questions about plan of care  eICU Interventions  Discussed plan of care to family in ER     Intervention Category Evaluation Type: Other  Dallan Schonberg 08/24/2016, 12:16 AM

## 2016-08-24 NOTE — Progress Notes (Signed)
Patient in afib, earlier in the day was rate controlled under 100 but rate has been increasing this evening consistently now 127-135 bpm with momentary increases to 150s. Spoke with cardiology and about family concerns about family history of  'heart stopping but pt has PPM. Will start amio with bolus."

## 2016-08-24 NOTE — Procedures (Signed)
Central Venous Catheter Insertion Procedure Note [name] [id number] 07/06/1927  Procedure: Insertion of Central Venous Catheter Indications: Assessment of intravascular volume, Drug and/or fluid administration and Frequent blood sampling  Procedure Details Consent: Risks of procedure as well as the alternatives and risks of each were explained to the (patient/caregiver).  Consent for procedure obtained. Time Out: Verified patient identification, verified procedure, site/side was marked, verified correct patient position, special equipment/implants available, medications/allergies/relevent history reviewed, required imaging and test results available.  Performed  Maximum sterile technique was used including antiseptics, cap, gloves, gown, hand hygiene, mask and sheet. Skin prep: Chlorhexidine; local anesthetic administered A antimicrobial bonded/coated triple lumen catheter was placed in the right internal jugular vein using the Seldinger technique. Ultrasound guidance used.Yes.   Catheter placed to 16 cm. Blood aspirated via all 3 ports and then flushed x 3. Line sutured x 2 and dressing applied.  Evaluation Blood flow good Complications: No apparent complications Patient did tolerate procedure well. Chest X-ray ordered to verify placement.  CXR: pending.  Joneen RoachPaul Hoffman, AGACNP-BC [facility] Pager [phone] or [phone]  08/24/2016 4:21 AM

## 2016-08-24 NOTE — Progress Notes (Signed)
Patient arrived to the unit at 0100. Waiting for POA to arrive from [place].  Patient does not have central line or A line to draw labs from.   Spoke with E-Link MD and Resp to see if anyone could come put in a line to pull labs.   E-Link and Resp responded and said they would be here as soon as possible.  Until then, will call lab to come do a STAT draw on labs.   Shabrea Weldin E Mylinda LatinaNino Combs, [place]

## 2016-08-24 NOTE — Progress Notes (Signed)
eLink Physician-Brief Progress Note Patient Name: [name] DOB: [birth year] MRN: [id number]   eLink Physician Progress Note and Electrolyte Replacement  Patient Name: [name] DOB: [birth year] MRN: [id number]  Date of Service  08/24/2016   HPI/Events of Note    Recent Labs Lab 06-Dec-2015 2344  08/22/16 0238 08/24/2016 0239 08/19/2016 1949  08/24/16 0014  08/24/16 0545 08/24/16 0633 08/24/16 0837 08/24/16 1241 08/24/16 1637  NA 136  < > 137 137 138  < > 136  137  < > 135 138 138 138 142  K 4.3  < > 4.4 4.4 3.7  < > 3.8  4.4  < > 3.3* 3.3* 3.5 3.4* 3.1*  CL 102  < > 101 102 113*  < > 101  103  < > 101 101 101 104 106  CO2 23  < > 27 26 15*  --  20*  17*  --  19*  --   --   --   --   GLUCOSE 201*  < > 148* 162* 238*  < > 478*  318*  < > 476* 456* 397* 318* 131*  BUN 22*  < > 14 14 16   < > 25*  25*  < > 26* 26* 26* 24* 24*  CREATININE 0.86  < > 0.68 0.77 0.82  < > 0.95  1.02*  < > 0.95 0.60 0.60 0.60 0.60  CALCIUM 9.2  < > 9.1 9.0 6.2*  --  8.8*  8.9  --  9.4  --   --   --   --   MG 2.0  --  2.0 2.0 1.7  --   --   --  1.9  --   --   --   --   PHOS  --   --   --   --   --   --   --   --  4.4  --   --   --   --   < > = values in this interval not displayed.  Estimated Creatinine Clearance: 46.4 mL/min (by C-G formula based on SCr of 0.6 mg/dL).  Intake/Output      11/22 0701 - 11/23 0700 11/23 0701 - 11/24 0700   I.V. (mL/kg) 832.8 (11.9) 1073.7 (15.3)   NG/GT  60   IV Piggyback  300   Total Intake(mL/kg) 832.8 (11.9) 1433.7 (20.5)   Urine (mL/kg/hr) 185 2375 (3)   Chest Tube 110 40 (0.1)   Total Output 295 2415   Net +537.8 -981.3         - I/O DETAILED x 24h    Total I/O In: 1433.7 [I.V.:1073.7; NG/GT:60; IV Piggyback:300] Out: 2415 [Urine:2375; Chest Tube:40] - I/O THIS SHIFT    ASSESSMENT Low K - 3.1  eICURN Interventions  40meq kcl via tube Check mag and phos   ASSESSMENT: MAJOR ELECTROLYTE      Dr. Kalman ShanMurali Rhealynn Myhre, M.D.,  [facility] Pulmonary and Critical Care Medicine Staff Physician [facility] [place] [facility] and Critical Care Pager: [phone], If no answer or between  15:00h - 7:00h: call [phone]  08/24/2016 6:25 PM      Intervention Category Minor Interventions: Other:;Electrolytes abnormality - evaluation and management Evaluation Type: Other  Jerryl Holzhauer 08/24/2016, 6:25 PM

## 2016-08-24 NOTE — Progress Notes (Signed)
eLink Physician-Brief Progress Note Patient Name: Loma BostonMillie H Crysler DOB: October 04, 1926 MRN: [id number]   Date of Service  08/24/2016  HPI/Events of Note  F/u CXR-ETT needs to be pushed in further-RT notified, Family at bedside considering  withdrawing care, however, POA is traveling from out of town. Nurse called for CVL-for pressors and lab draws  eICU Interventions  Will continue care until POA at bedside Will ned to consider CVL, may not need art line Await for POA to discuss plan of care to withdraw  care     Intervention Category Evaluation Type: Other  Shastina Rua 08/24/2016, 1:55 AM

## 2016-08-24 NOTE — Progress Notes (Signed)
PULMONARY / CRITICAL CARE MEDICINE   Name: [name] MRN: [id number] DOB: 08/28/1927    ADMISSION DATE:  08/14/2016 CONSULTATION DATE:  08/07/2016  REFERRING MD: Dr. Dalene Seltzer  CHIEF COMPLAINT: Cardiac arrest  HISTORY OF PRESENT ILLNESS:  82 y.o. female with extensive cardiac history with recent admission for TAVR, which was without complication and she recovered well from. She has been admitted twice since then with syncope and then pulmonary edema secondary to AF RVR. She is on coumadin. She was just discharged 11/22 from her AFRVR admission and was doing well, however, later that day while sitting at home and talking on the phone she became unresponsive. Witnessed by nephew who started CPR after calling EMS. 10 mins later EMS arrived and noted her to be in VF. She was defibrillated and given several rounds of epinephrine. Down-time estimated at about 30 mins. In ED she was intubated and found to have R sided pneumothorax. PCCM asked to admit.  SUBJECTIVE: No acute events since admission overnight. Cooled per protocol.  REVIEW OF SYSTEMS:  Unable to obtain with patient intubated & sedated.  VITAL SIGNS: BP (!) 132/56   Pulse 84   Temp (!) 91.8 F (33.2 C)   Resp 16   Ht 5\' 6"  (1.676 m)   Wt 154 lb 5.2 oz (70 kg)   SpO2 100%   BMI 24.91 kg/m   HEMODYNAMICS: CVP:  [8 mmHg-11 mmHg] 9 mmHg  VENTILATOR SETTINGS: Vent Mode: PRVC FiO2 (%):  [40 %-100 %] 40 % Set Rate:  [16 bmp] 16 bmp Vt Set:  [500 mL] 500 mL PEEP:  [5 cmH20] 5 cmH20 Plateau Pressure:  [18 cmH20-20 cmH20] 20 cmH20  INTAKE / OUTPUT: I/O last 3 completed shifts: In: 832.8 [I.V.:832.8] Out: 295 [Urine:185; Chest Tube:110]  PHYSICAL EXAMINATION: General:  Elderly female. No family at bedside. Neuro:  Sedated and paralyzed. Pupils pinpoint. HEENT:  Endotracheal tube in place. No scleral icterus or injection. Cardiovascular:  Regular rate. No edema. Unable to appreciate JVD.  Pulmonary: Symmetric chest  wall rise on ventilator. Clear bilaterally to auscultation.  Abdomen:  Soft. Nondistended. Integument: Warm and dry. No rash on exposed skin.   LABS:  BMET  Recent Labs Lab 08/25/2016 1949  08/24/16 0014  08/24/16 0545 08/24/16 2130 08/24/16 0837 08/24/16 1241  NA 138  < > 136  137  < > 135 138 138 138  K 3.7  < > 3.8  4.4  < > 3.3* 3.3* 3.5 3.4*  CL 113*  < > 101  103  < > 101 101 101 104  CO2 15*  --  20*  17*  --  19*  --   --   --   BUN 16  < > 25*  25*  < > 26* 26* 26* 24*  CREATININE 0.82  < > 0.95  1.02*  < > 0.95 0.60 0.60 0.60  GLUCOSE 238*  < > 478*  318*  < > 476* 456* 397* 318*  < > = values in this interval not displayed.  Electrolytes  Recent Labs Lab 08/12/2016 0239 08/21/2016 1949 08/24/16 0014 08/24/16 0545  CALCIUM 9.0 6.2* 8.8*  8.9 9.4  MG 2.0 1.7  --  1.9  PHOS  --   --   --  4.4    CBC  Recent Labs Lab 08/22/16 0238 08/31/2016 1949  08/24/16 0545 08/24/16 0633 08/24/16 0837 08/24/16 1241  WBC 10.2 14.3*  --  19.6*  --   --   --  HGB 10.4* 9.1*  < > 10.9* 11.6* 11.6* 11.6*  HCT 32.3* 28.4*  < > 33.6* 34.0* 34.0* 34.0*  PLT 231 217  --  298  --   --   --   < > = values in this interval not displayed.  Coag's  Recent Labs Lab Sep 09, 2016 2344 08/22/16 0238 08/13/2016 0239 08/24/16 0630  APTT 55*  --   --  43*  INR 4.31* 4.28* 3.01 2.20    Sepsis Markers  Recent Labs Lab 08/13/2016 1952 08/07/2016 2251  LATICACIDVEN 5.71* 4.48*    ABG  Recent Labs Lab 08/02/2016 2007 08/24/16 0223  PHART 7.276* 7.349*  PCO2ART 43.3 36.7  PO2ART 148.0* 94.0    Liver Enzymes  Recent Labs Lab 08/21/16 0650 08/18/2016 1949  AST 31 72*  ALT 16 29  ALKPHOS 81 79  BILITOT 1.1 0.3  ALBUMIN 3.9 2.4*    Cardiac Enzymes  Recent Labs Lab 08/24/16 0014 08/24/16 0545 08/24/16 1032  TROPONINI 0.07* 0.07* 0.05*    Glucose  Recent Labs Lab 08/24/16 0511 08/24/16 0611 08/24/16 0731 08/24/16 0833 08/24/16 0932 08/24/16 1037   GLUCAP 457* 411* 418* 368* 368* 337*    Imaging Dg Chest Port 1 View  Result Date: 08/24/2016 CLINICAL DATA:  82 y/o  F; encounter for central line placement. EXAM: PORTABLE CHEST 1 VIEW COMPARISON:  08/02/2016 chest radiograph. FINDINGS: Stable cardiac silhouette given projection and technique. Aortic valve replacement. Right central venous catheter tip projects over upper SVC. Right-sided chest tube unchanged in position. Extensive subcutaneous emphysema over the right-sided chest wall. Single lead pacemaker. Additional electronic device projects over lower mediastinum. Transcutaneous pacing pads on left chest wall. Trace if any residual right-sided pneumothorax with accentuation of silhouettes. Endotracheal tube unchanged in position. Stable patchy airspace opacities. IMPRESSION: Right central venous catheter tip projects over upper SVC. Other lines and tubes are stable. Minimal if any right-sided pneumothorax. Stable patchy airspace opacities may represent edema, pneumonia, or ARDS. Electronically Signed   By: Mitzi Hansen M.D.   On: 08/24/2016 04:27   Dg Chest Portable 1 View  Result Date: 08/14/2016 CLINICAL DATA:  Chest tube placement.  Initial encounter. EXAM: PORTABLE CHEST 1 VIEW COMPARISON:  Chest radiograph performed earlier today at 8:40 p.m. FINDINGS: The patient's endotracheal tube is seen ending 4-5 cm above the carina. An enteric tube is noted extending below the diaphragm. A right-sided chest tube is noted. No significant pneumothorax is seen. Worsening bilateral airspace opacification may reflect pneumonia or pulmonary edema. ARDS is a concern. No definite pleural effusion is seen. The cardiomediastinal silhouette is borderline enlarged. A pacemaker is noted overlying the left chest wall, with a lead ending overlying the right ventricle. A vascular stent is noted overlying the heart. Prominent soft tissue air is noted bilaterally, significantly increased on the right and  tracking into the neck. External pacing pads are noted. IMPRESSION: 1. Endotracheal tube seen ending 4-5 cm above the carina. 2. Right-sided chest tubes noted, without definite evidence of pneumothorax. 3. Worsening bilateral airspace opacification may reflect pneumonia or pulmonary edema. ARDS is a concern. 4. Borderline cardiomegaly. 5. Significantly worsened right-sided soft tissue air, tracking into the neck, status post placement of right-sided chest tube. Electronically Signed   By: Roanna Raider M.D.   On: 08/16/2016 23:43   Dg Chest Portable 1 View  Result Date: 08/22/2016 CLINICAL DATA:  Repositioning of ET tube EXAM: PORTABLE CHEST 1 VIEW COMPARISON:  08/31/2016 FINDINGS: Interim repositioning of endotracheal tube, the tip is now  4.1 cm superior to the carina. Single lead left-sided pacing device remains in place. Valvular prosthesis again noted. Esophageal tube tip extends below the diaphragm. There is stable cardiomegaly. Hazy bilateral pulmonary infiltrates or edema, do not appear significantly changed. Interval increase in size of right-sided pneumothorax, demonstrating 2.2 cm of pleural-parenchymal separation at the apex. Pneumothorax is seen laterally as well as along the right cardiophrenic sulcus. Increased subcutaneous emphysema over the right lateral chest wall. IMPRESSION: 1. Support lines and tubes as described above. 2. No significant interval change in cardiomegaly and bilateral interstitial and alveolar infiltrates or edema, right greater than left. 3. Slight interval increase in size of small moderate right-sided pneumothorax. Electronically Signed   By: Jasmine PangKim  Fujinaga M.D.   On: 06/30/16 21:00   Dg Chest Portable 1 View  Result Date: 06/30/16 CLINICAL DATA:  Post intubation in CPR EXAM: PORTABLE CHEST 1 VIEW COMPARISON:  08/22/2016 FINDINGS: Endotracheal to appears high above the thoracic inlet approximately 13 cm from the carina. Recommend advancing 6 cm. NG tube in the  stomach. Moderate volume RIGHT pneumothorax measures 11 mm from chest wall and occupies approximately 10-15% of lung volume. IMPRESSION: 1. Moderate volume RIGHT pneumothorax. 2. Endotracheal tube is high.  Recommend advancement by 6 cm. Critical Value/emergent results were called by telephone at the time of interpretation on 06/30/16 at 8:10 pm to Dr. Alvira MondayERIN SCHLOSSMAN , who verbally acknowledged these results. Electronically Signed   By: Genevive BiStewart  Edmunds M.D.   On: 06/30/16 20:11     STUDIES:  EEG 11/23 >> CT Head w/o 11/23 >>  MICROBIOLOGY: MRSA PCR 11/20:  Negative MRSA PCR 11/23:  Negative Blood Ctx x2 11/22 >>  ANTIBIOTICS: Cefepime 11/22 >> Vancomycin 11/22 >>  SIGNIFICANT EVENTS: 11/22 - discharge, subsequent cardiac arrest  LINES/TUBES: OETT 7.0 11/22 >> R Chest Tube 11/22 >> R IJ CVL 11/23 >> L Radial Art Line 11/23 >> Foley 11/22 >> OGT 11/22 >> PIV  ASSESSMENT / PLAN:  PULMONARY A: Acute Hypoxic Respiratory Failure - Secondary to cardiac arrest. Right Sided Pneumothorax - S/P Chest tube.  P:   Full vent support Intermittent ABG & Portable CXR Continuing Chest Tube to Suction SBT & PS Wean after re-warming  CARDIOVASCULAR A:  Shock - Likely cardiogenic.  S/P Cardiac Arrest - Ventricular fibrillation. Acute on Chronic Systolic CHF Atrial Fibrillation with RVR - Repeated admissions. Holding Amiodarone given potential adverse reaction.  Cardiac arrest Acute on chronic systolic CHF Atrial fibrillation with RVR S/P TAVR 07/2016 S/P Pacemaker H/O Aortic Stenosis, HTN, & HLD  P:  Continuous Telemetry Monitoring Vitals per unit protocol Levophed drip for Goal MAP >80 Cardiology Consulted - Appreciate recommendations Lasix IV bid Complete Echocardiogram Pending  RENAL A:   Hypokalemia - Mild.   P:   Trending UOP with Foley Monitoring electrolytes & renal function daily as well as per protocol Replacing electrolytes as  indicated  GASTROINTESTINAL A:   No acute issues  P:   NPO Pepcid IV q12hr Holding on tube feedings  HEMATOLOGIC A:   Coagulopathy - Secondary to Warfarin. Anemia - No signs of active bleeding.   P:  Trending cell counts daily w/ CBC Trending INR daily Holding Warfarin Heparin gtt per pharmacy protocol  INFECTIOUS A:   No evidence of acute infection.  P:   Empiric Vancomycin & Cefepime Day #2 Awaiting culture results Trending procalcitonin per algorithm   ENDOCRINE A:   H/O DM    P:   CBG per protocol Insulin gtt initiated Accu-Checks  q1hr  NEUROLOGIC A:   Acute Encephalopathy - Likely anoxic. Sedation on Ventilator  P:   RASS goal: Per Hypothermia Protocol Propofol gtt Fentanyl gtt EEG Pending Checking Portable Head CT w/o  Nimbex prn to prevent shivering Sedation per Hypothermia Protocol  FAMILY  - Updates: HCPOA [facility] Middleton updated via phone by Dr. Jamison NeighborNestor 11/23.   - Inter-disciplinary family meet or Palliative Care meeting due by:  11/29   TODAY'S SUMMARY:  82 y.o. female with cardiac history just discharged 11/22 presents same day after cardiac arrest 30 mins VF. R sided PTX CT placed in ED. continuing therapeutic hypothermia per protocol. Ordering CT head without contrast portable and awaiting results of EEG. Awaiting echocardiogram result.   I have spent a total of 36 minutes of critical care time today caring for the patient and reviewing the patient's electronic medical record as well as updating family at bedside.  Donna ChristenJennings E. Jamison NeighborNestor, M.D. [facility] Pager:  [phone] After 3pm or if no response, call [phone] 08/24/2016 3:33 PM

## 2016-08-24 NOTE — Progress Notes (Addendum)
Patient Name: [name] Date of Encounter: 08/24/2016  Active Problems:   Cardiac arrest ([place])   Length of Stay: 1  SUBJECTIVE  Intubated, sedated, paralyzed.   CURRENT MEDS . artificial tears  1 application Both Eyes K3T  . [START ON 08/25/2016] ceFEPime (MAXIPIME) IV  1 g Intravenous Q24H  . ceFEPime (MAXIPIME) IV  2 g Intravenous Once  . chlorhexidine gluconate (MEDLINE KIT)  15 mL Mouth Rinse BID  . cisatracurium  0.1 mg/kg Intravenous Once  . famotidine (PEPCID) IV  20 mg Intravenous Q12H  . fentaNYL (SUBLIMAZE) injection  50 mcg Intravenous Once  . lidocaine (PF)      . mouth rinse  15 mL Mouth Rinse 10 times per day  . sodium chloride  1,000 mL Intravenous Once  . vancomycin  1,000 mg Intravenous Q24H   . sodium chloride    . cisatracurium (NIMBEX) infusion 1 mcg/kg/min (08/24/16 0112)  . fentaNYL infusion INTRAVENOUS 250 mcg/hr (08/24/16 0559)  . insulin (NOVOLIN-R) infusion 14 Units/hr (08/24/16 0612)  . norepinephrine (LEVOPHED) Adult infusion 28 mcg/min (08/24/16 0920)  . propofol (DIPRIVAN) infusion 35 mcg/kg/min (08/24/16 0558)   OBJECTIVE  Vitals:   08/24/16 0845 08/24/16 0900 08/24/16 0915 08/24/16 0930  BP:      Pulse: (!) 42 68    Resp: '16 16 16 16  '$ Temp:  (!) 92.5 F (33.6 C)    TempSrc:      SpO2: 100% 100% 100% 100%  Weight:      Height:        Intake/Output Summary (Last 24 hours) at 08/24/16 0958 Last data filed at 08/24/16 0800  Gross per 24 hour  Intake            965.8 ml  Output              335 ml  Net            630.8 ml   Filed Weights   08/22/2016 2200 08/24/16 0000  Weight: 147 lb 7.8 oz (66.9 kg) 154 lb 5.2 oz (70 kg)   PHYSICAL EXAM  Intubated, sedated, paralyzed. . Neck: Supple without bruits or JVD. Lungs:  Resp regular and unlabored, unable to listen sec to arctic sun Heart: RRR no s3, s4, or murmurs. Abdomen: Soft, non-tender, non-distended, BS + x 4.  Extremities: No clubbing, cyanosis or edema, cool.  DP/PT/Radials 2+ and equal bilaterally.  Accessory Clinical Findings  CBC  Recent Labs  08/22/2016 1949 08/25/2016 1951 08/24/16 0545  WBC 14.3*  --  19.6*  NEUTROABS 9.2*  --   --   HGB 9.1* 11.9* 10.9*  HCT 28.4* 35.0* 33.6*  MCV 92.5  --  89.6  PLT 217  --  465   Basic Metabolic Panel  Recent Labs  08/20/2016 1949  08/24/16 0014 08/24/16 0545  NA 138  < > 136  137 135  K 3.7  < > 3.8  4.4 3.3*  CL 113*  < > 101  103 101  CO2 15*  --  20*  17* 19*  GLUCOSE 238*  < > 478*  318* 476*  BUN 16  < > 25*  25* 26*  CREATININE 0.82  < > 0.95  1.02* 0.95  CALCIUM 6.2*  --  8.8*  8.9 9.4  MG 1.7  --   --  1.9  PHOS  --   --   --  4.4  < > = values in this interval not displayed. Liver  Function Tests  Recent Labs  08/03/2016 1949  AST 72*  ALT 29  ALKPHOS 79  BILITOT 0.3  PROT 4.6*  ALBUMIN 2.4*   No results for input(s): LIPASE, AMYLASE in the last 72 hours. Cardiac Enzymes  Recent Labs  08/22/16 0238 08/24/16 0014 08/24/16 0545  TROPONINI 0.03* 0.07* 0.07*   Dg Chest 2 View  Result Date: 08/21/2016 CLINICAL DATA:  Shortness of breath starting at 8 p.m. and gradually worsening. History of atrial fibrillation and hypertension. EXAM: CHEST  2 VIEW COMPARISON:  07/14/2016 FINDINGS: Postoperative changes in the mediastinum. Cardiac pacemaker. Mild cardiac enlargement. Increasing interstitial pattern to the lungs mostly in the periphery and lung bases, likely representing developing edema. Small bilateral pleural effusions. No focal consolidation. No pneumothorax. Calcified and tortuous aorta. Degenerative changes in the spine and shoulders. IMPRESSION: Cardiac enlargement with increasing interstitial pattern to the lungs suggesting developing interstitial edema. Small bilateral pleural effusions. Electronically Signed   By: Burman Nieves M.D.   On: 08/21/2016 01:18   Dg Chest Port 1 View  Result Date: 08/24/2016 CLINICAL DATA:  82 y/o  F; encounter for central  line placement. EXAM: PORTABLE CHEST 1 VIEW COMPARISON:  08/04/2016 chest radiograph. FINDINGS: Stable cardiac silhouette given projection and technique. Aortic valve replacement. Right central venous catheter tip projects over upper SVC. Right-sided chest tube unchanged in position. Extensive subcutaneous emphysema over the right-sided chest wall. Single lead pacemaker. Additional electronic device projects over lower mediastinum. Transcutaneous pacing pads on left chest wall. Trace if any residual right-sided pneumothorax with accentuation of silhouettes. Endotracheal tube unchanged in position. Stable patchy airspace opacities. IMPRESSION: Right central venous catheter tip projects over upper SVC. Other lines and tubes are stable. Minimal if any right-sided pneumothorax. Stable patchy airspace opacities may represent edema, pneumonia, or ARDS. Electronically Signed   By: Mitzi Hansen M.D.   On: 08/24/2016 04:27   Dg Chest Portable 1 View  Result Date: 08/31/2016 CLINICAL DATA:  Chest tube placement.  Initial encounter. EXAM: PORTABLE CHEST 1 VIEW COMPARISON:  Chest radiograph performed earlier today at 8:40 p.m. FINDINGS: The patient's endotracheal tube is seen ending 4-5 cm above the carina. An enteric tube is noted extending below the diaphragm. A right-sided chest tube is noted. No significant pneumothorax is seen. Worsening bilateral airspace opacification may reflect pneumonia or pulmonary edema. ARDS is a concern. No definite pleural effusion is seen. The cardiomediastinal silhouette is borderline enlarged. A pacemaker is noted overlying the left chest wall, with a lead ending overlying the right ventricle. A vascular stent is noted overlying the heart. Prominent soft tissue air is noted bilaterally, significantly increased on the right and tracking into the neck. External pacing pads are noted. IMPRESSION: 1. Endotracheal tube seen ending 4-5 cm above the carina. 2. Right-sided chest tubes  noted, without definite evidence of pneumothorax. 3. Worsening bilateral airspace opacification may reflect pneumonia or pulmonary edema. ARDS is a concern. 4. Borderline cardiomegaly. 5. Significantly worsened right-sided soft tissue air, tracking into the neck, status post placement of right-sided chest tube. Electronically Signed   By: Roanna Raider M.D.   On: 08/11/2016 23:43   Dg Chest Portable 1 View  Result Date: 08/04/2016 CLINICAL DATA:  Repositioning of ET tube EXAM: PORTABLE CHEST 1 VIEW COMPARISON:  08/11/2016 FINDINGS: Interim repositioning of endotracheal tube, the tip is now 4.1 cm superior to the carina. Single lead left-sided pacing device remains in place. Valvular prosthesis again noted. Esophageal tube tip extends below the diaphragm. There is stable cardiomegaly.  Hazy bilateral pulmonary infiltrates or edema, do not appear significantly changed. Interval increase in size of right-sided pneumothorax, demonstrating 2.2 cm of pleural-parenchymal separation at the apex. Pneumothorax is seen laterally as well as along the right cardiophrenic sulcus. Increased subcutaneous emphysema over the right lateral chest wall. IMPRESSION: 1. Support lines and tubes as described above. 2. No significant interval change in cardiomegaly and bilateral interstitial and alveolar infiltrates or edema, right greater than left. 3. Slight interval increase in size of small moderate right-sided pneumothorax. Electronically Signed   By: Jasmine Pang M.D.   On: 08/12/2016 21:00   Dg Chest Portable 1 View  Result Date: 08/15/2016 CLINICAL DATA:  Post intubation in CPR EXAM: PORTABLE CHEST 1 VIEW COMPARISON:  08/22/2016 FINDINGS: Endotracheal to appears high above the thoracic inlet approximately 13 cm from the carina. Recommend advancing 6 cm. NG tube in the stomach. Moderate volume RIGHT pneumothorax measures 11 mm from chest wall and occupies approximately 10-15% of lung volume. IMPRESSION: 1. Moderate volume  RIGHT pneumothorax. 2. Endotracheal tube is high.  Recommend advancement by 6 cm. Critical Value/emergent results were called by telephone at the time of interpretation on 08/26/2016 at 8:10 pm to Dr. Alvira Monday , who verbally acknowledged these results. Electronically Signed   By: Genevive Bi M.D.   On: 08/14/2016 20:11   Dg Chest Port 1 View  Result Date: 08/22/2016 CLINICAL DATA:  Follow-up pleural effusions.  Subsequent encounter. EXAM: PORTABLE CHEST 1 VIEW COMPARISON:  Chest radiograph performed 08/21/2016 FINDINGS: A small left pleural effusion is again noted, similar in appearance to the prior study. The right-sided pleural effusion appears to have largely resolved. Vascular congestion is noted. Increased interstitial markings may reflect mild interstitial edema or possibly pneumonia. No pneumothorax is seen. The cardiomediastinal silhouette is borderline normal in size. A stent is noted overlying the heart. A pacemaker is noted overlying the left chest wall, with a lead ending overlying the right ventricle. No acute osseous abnormalities are seen. Degenerative change is noted at the right glenohumeral joint. IMPRESSION: Small left pleural effusion, similar in appearance to the prior study. Right-sided pleural effusion appears to have largely resolved. Vascular congestion noted. Increased interstitial markings may reflect mild interstitial edema or possibly pneumonia. Electronically Signed   By: Roanna Raider M.D.   On: 08/22/2016 03:34   TELE: a-fib, 80-100 BPM  LHC: 06/14/2016 1. Widely patent coronary arteries with no angiographic disease in the LAD, left main, or right coronary artery and minimal irregularity in the left circumflex 2. Calcified aortic valve with restricted leaflet motion and severe aortic stenosis with a mean gradient of 35 mmHg 3. Normal LV systolic function with mild mitral regurgitation by ventriculography 4. Normal aortic root without evidence of aneurysm,  estimated angle for valve deployment LAO 5, caudal 5 5. Aborted right heart catheterization (estimated PASP by echo 37 mmHg)  TTE: 08/03/2016 Left ventricle: The cavity size was normal. Wall thickness was   normal. The estimated ejection fraction was 55%. Wall motion was   normal; there were no regional wall motion abnormalities.   Indeterminant diastolic function (atrial fibrillation). - Aortic valve: There is a bioprosthetic aortic valve s/p TAVR.   Mild peri-valvular regurgitation. No significant stenosis. Mean   gradient (S): 11 mm Hg. - Mitral valve: Moderately calcified annulus. Moderately calcified   leaflets . There was mild regurgitation. - Left atrium: The atrium was moderately dilated. - Right ventricle: The cavity size was normal. Pacer wire or   catheter noted in right  ventricle. Systolic function was normal. - Right atrium: The atrium was moderately dilated. - Tricuspid valve: Peak RV-RA gradient (S): 35 mm Hg. - Pulmonary arteries: PA peak pressure: 43 mm Hg (S). - Systemic veins: IVC measured 2.0 cm with < 50% respirophasic   variation, suggestiing RA pressure 8 mmHg.  Impressions: - The patient was in atrial fibrillation. Normal LV size with EF   55%. Normal RV size and systolic function. Moderate biatrial   enlargement. Bioprosthetic aortic valve s/p TAVR. Mild   peri-valvular aortic insufficiency. Mild mitral regurgitation.   Mild pulmonary hypertension.    ASSESSMENT AND PLAN  82 year old female with a past medical history of TAVR (25m EParticia Lather3 valve) on 178/03/7671 chronic diastolic CHF, DM, HLD, HTN, and  tachy-brady syndrome s/p SJM single chamber PPM, now in persistent a-fib. LVEF 55%. She presented to the ED on 08/29/2016 with SOB and rapid Afib associated with SOB. She was started on iv bilateral pleural effusions.  She was diuresed and HR was controlled, discharged yesterday in the morning. Normal electrolytes and crea at the time. She was at home  and her family witnessed her lose consciousness with immediate CPR attempt, in VF upon EMS arrival, back to SR after to shocks, required epinephrine for hypotension.  In a-fib with RVR on arrival to the hospital.  1. VF arrest - unknown cause, she had clean coronaries in 06/2016, s/p TAVR with normally functioning valve and normal LVEF, normal electrolytes including K, Na, Mg.  Troponin 0.07 x 3 with flat trend most probably sec to shocks, CPR and hypotension.  High lactic acid on arrival 5.7.  She is on hypothermia protocol now. She has a SEngineer, petroleum we will call for interrogation that will help u[place]explain what happened.  I will start iv amiodarone ? A-fib with RVR --> VF?  2. Atrial fibrillation with RVR: I will start iv amiodarone, she had difficulties to manage her HR despite high doses of metoprolol, diltiazem and digoxin, amiodarone can also prevent VTs.  3. Acute on chronic diastolic CHF: after CPR and fluid resuscitation. I will start lasix 20 mg iv BID.  4. HLD: On statin, continue same.   5. Hypokalemia - we will replace   She remains critically ill. I have discussed the case with her daughter and granddaughter. The prognosis depends on neurologic recovery.  We will monitor closely. Total time spent with the patient - 60 minutes.  Signed, KEna DawleyMD, FPaoli Surgery Center LP11/23/2017

## 2016-08-25 ENCOUNTER — Inpatient Hospital Stay (HOSPITAL_COMMUNITY): Payer: Medicare Other

## 2016-08-25 DIAGNOSIS — I469 Cardiac arrest, cause unspecified: Secondary | ICD-10-CM

## 2016-08-25 DIAGNOSIS — Z7901 Long term (current) use of anticoagulants: Secondary | ICD-10-CM

## 2016-08-25 DIAGNOSIS — R570 Cardiogenic shock: Secondary | ICD-10-CM | POA: Diagnosis not present

## 2016-08-25 LAB — BASIC METABOLIC PANEL
ANION GAP: 11 (ref 5–15)
ANION GAP: 9 (ref 5–15)
Anion gap: 11 (ref 5–15)
BUN: 22 mg/dL — AB (ref 6–20)
BUN: 22 mg/dL — ABNORMAL HIGH (ref 6–20)
BUN: 23 mg/dL — AB (ref 6–20)
CALCIUM: 7.8 mg/dL — AB (ref 8.9–10.3)
CHLORIDE: 109 mmol/L (ref 101–111)
CHLORIDE: 109 mmol/L (ref 101–111)
CO2: 17 mmol/L — AB (ref 22–32)
CO2: 18 mmol/L — AB (ref 22–32)
CO2: 18 mmol/L — ABNORMAL LOW (ref 22–32)
Calcium: 8.4 mg/dL — ABNORMAL LOW (ref 8.9–10.3)
Calcium: 9 mg/dL (ref 8.9–10.3)
Chloride: 109 mmol/L (ref 101–111)
Creatinine, Ser: 0.68 mg/dL (ref 0.44–1.00)
Creatinine, Ser: 0.8 mg/dL (ref 0.44–1.00)
Creatinine, Ser: 0.84 mg/dL (ref 0.44–1.00)
GFR calc Af Amer: >60 mL/min (ref >60)
GFR calc Af Amer: >60 mL/min (ref >60)
GFR calc non Af Amer: >60 mL/min (ref >60)
GLUCOSE: 261 mg/dL — AB (ref 65–99)
GLUCOSE: 289 mg/dL — AB (ref 65–99)
Glucose, Bld: 151 mg/dL — ABNORMAL HIGH (ref 65–99)
POTASSIUM: 3.4 mmol/L — AB (ref 3.5–5.1)
POTASSIUM: 4.4 mmol/L (ref 3.5–5.1)
Potassium: 4.7 mmol/L (ref 3.5–5.1)
SODIUM: 138 mmol/L (ref 135–145)
Sodium: 138 mmol/L (ref 135–145)
Sodium: 135 mmol/L (ref 135–145)

## 2016-08-25 LAB — GLUCOSE, CAPILLARY
GLUCOSE-CAPILLARY: 133 mg/dL — AB (ref 65–99)
GLUCOSE-CAPILLARY: 143 mg/dL — AB (ref 65–99)
GLUCOSE-CAPILLARY: 158 mg/dL — AB (ref 65–99)
GLUCOSE-CAPILLARY: 219 mg/dL — AB (ref 65–99)
GLUCOSE-CAPILLARY: 224 mg/dL — AB (ref 65–99)
GLUCOSE-CAPILLARY: 226 mg/dL — AB (ref 65–99)
GLUCOSE-CAPILLARY: 90 mg/dL (ref 65–99)
Glucose-Capillary: 82 mg/dL (ref 65–99)
Glucose-Capillary: 114 mg/dL — ABNORMAL HIGH (ref 65–99)
Glucose-Capillary: 159 mg/dL — ABNORMAL HIGH (ref 65–99)
Glucose-Capillary: 290 mg/dL — ABNORMAL HIGH (ref 65–99)
Glucose-Capillary: 253 mg/dL — ABNORMAL HIGH (ref 65–99)

## 2016-08-25 LAB — CBC
HCT: 35.6 % — ABNORMAL LOW (ref 36.0–46.0)
Hemoglobin: 11.7 g/dL — ABNORMAL LOW (ref 12.0–15.0)
MCH: 28.9 pg (ref 26.0–34.0)
MCHC: 32.9 g/dL (ref 30.0–36.0)
MCV: 87.9 fL (ref 78.0–100.0)
PLATELETS: 253 10*3/uL (ref 150–400)
RBC: 4.05 MIL/uL (ref 3.87–5.11)
RDW: 14.4 % (ref 11.5–15.5)
WBC: 18.4 10*3/uL — ABNORMAL HIGH (ref 4.0–10.5)

## 2016-08-25 LAB — PROTIME-INR
INR: 3.14
PROTHROMBIN TIME: 33 s — AB (ref 11.4–15.2)

## 2016-08-25 LAB — MAGNESIUM: Magnesium: 1.8 mg/dL (ref 1.7–2.4)

## 2016-08-25 LAB — RENAL FUNCTION PANEL
ANION GAP: 13 (ref 5–15)
Albumin: 2.9 g/dL — ABNORMAL LOW (ref 3.5–5.0)
BUN: 23 mg/dL — ABNORMAL HIGH (ref 6–20)
CALCIUM: 9 mg/dL (ref 8.9–10.3)
CHLORIDE: 108 mmol/L (ref 101–111)
CO2: 17 mmol/L — AB (ref 22–32)
CREATININE: 0.74 mg/dL (ref 0.44–1.00)
GFR calc Af Amer: >60 mL/min (ref >60)
GFR calc non Af Amer: >60 mL/min (ref >60)
GLUCOSE: 218 mg/dL — AB (ref 65–99)
Phosphorus: 4.2 mg/dL (ref 2.5–4.6)
Potassium: 4 mmol/L (ref 3.5–5.1)
SODIUM: 138 mmol/L (ref 135–145)

## 2016-08-25 LAB — PHOSPHORUS: PHOSPHORUS: 4.1 mg/dL (ref 2.5–4.6)

## 2016-08-25 LAB — TRIGLYCERIDES: TRIGLYCERIDES: 187 mg/dL — AB (ref <150)

## 2016-08-25 LAB — ECHOCARDIOGRAM LIMITED
HEIGHTINCHES: 66 in
WEIGHTICAEL: 2469.15 [oz_av]

## 2016-08-25 MED ORDER — DILTIAZEM LOAD VIA INFUSION
10.0000 mg | Freq: Once | INTRAVENOUS | Status: AC
  Administered 2016-08-25: 10 mg via INTRAVENOUS
  Filled 2016-08-25: qty 10

## 2016-08-25 MED ORDER — AMIODARONE IV BOLUS ONLY 150 MG/100ML
150.0000 mg | Freq: Once | INTRAVENOUS | Status: AC
  Administered 2016-08-25: 150 mg via INTRAVENOUS
  Filled 2016-08-25: qty 100

## 2016-08-25 MED ORDER — INSULIN GLARGINE 100 UNIT/ML ~~LOC~~ SOLN
30.0000 [IU] | Freq: Every day | SUBCUTANEOUS | Status: DC
  Administered 2016-08-25: 30 [IU] via SUBCUTANEOUS
  Filled 2016-08-25 (×2): qty 0.3

## 2016-08-25 MED ORDER — INSULIN ASPART 100 UNIT/ML ~~LOC~~ SOLN
0.0000 [IU] | SUBCUTANEOUS | Status: DC
  Administered 2016-08-25: 11 [IU] via SUBCUTANEOUS
  Administered 2016-08-25: 7 [IU] via SUBCUTANEOUS
  Administered 2016-08-25: 11 [IU] via SUBCUTANEOUS
  Administered 2016-08-26 (×2): 7 [IU] via SUBCUTANEOUS
  Administered 2016-08-26: 11 [IU] via SUBCUTANEOUS

## 2016-08-25 MED ORDER — DILTIAZEM LOAD VIA INFUSION
10.0000 mg | Freq: Once | INTRAVENOUS | Status: DC | PRN
  Filled 2016-08-25: qty 10

## 2016-08-25 MED ORDER — MAGNESIUM SULFATE 2 GM/50ML IV SOLN
2.0000 g | Freq: Once | INTRAVENOUS | Status: AC
  Administered 2016-08-25: 2 g via INTRAVENOUS
  Filled 2016-08-25: qty 50

## 2016-08-25 MED ORDER — DILTIAZEM HCL-DEXTROSE 100-5 MG/100ML-% IV SOLN (PREMIX)
5.0000 mg/h | INTRAVENOUS | Status: DC
  Administered 2016-08-25: 5 mg/h via INTRAVENOUS
  Administered 2016-08-26: 15 mg/h via INTRAVENOUS
  Administered 2016-08-26: 5 mg/h via INTRAVENOUS
  Filled 2016-08-25 (×3): qty 100

## 2016-08-25 NOTE — Progress Notes (Signed)
Corine ShelterLuke Kilroy PA paged regarding HR staying 150's to 160. BP stable. Orders received.

## 2016-08-25 NOTE — Progress Notes (Signed)
Initial Nutrition Assessment  DOCUMENTATION CODES:   Not applicable  INTERVENTION:   -Once pt is re-warmed, recommend:   Initiate TF via OGT with Vital High Protein at goal rate of 30 ml/h (720 ml per day) and Prostat 30 ml daily to provide 820 kcals, 78 gm protein, 584 ml free water daily. Regimen will provides 1245 kcals (100% of estimated needs) with current rate of propofol.   NUTRITION DIAGNOSIS:   Inadequate oral intake related to inability to eat as evidenced by NPO status.  GOAL:   Patient will meet greater than or equal to 90% of their needs  MONITOR:   Vent status, Labs, Weight trends, Skin, I & O's  REASON FOR ASSESSMENT:   Ventilator    ASSESSMENT:   82 year old female with cardiac history just discharged 11/22 presents same day after cardiac arrest 30 mins VF. R sided PTX CT placed in ED. Family wishing to be aggressive and will admit with TTM protocol on vent. Full code.   Patient is currently intubated on ventilator support.  MV: 8.0 L/min Temp (24hrs), Avg:92.6 F (33.7 C), Min:90.9 F (32.7 C), Max:96.6 F (35.9 C)  Propofol: 16.1 ml/hr (providing 425 kcals daily at current rate)  Pt with rt sided pneumothorax; chest tube inserted on May 04, 2016.  Pt on arctic sun protocol; noted current temperate of 35.7 C.   Nutrition-Focused physical exam completed. Findings are moderate to severe fat depletion, mild to moderate muscle depletion, and mild to moderate edema.   Labs reviewed: CBGS: 159-224.  Diet Order:   NPO  Skin:  Reviewed, no issues  Last BM:  08/12/2016  Height:   Ht Readings from Last 1 Encounters:  08/24/16 5\' 6"  (1.676 m)    Weight:   Wt Readings from Last 1 Encounters:  08/24/16 154 lb 5.2 oz (70 kg)    Ideal Body Weight:  59.1 kg  BMI:  Body mass index is 24.91 kg/m.  Estimated Nutritional Needs:   Kcal:  1232.7  Protein:  75-90 grams  Fluid:  >1.2 L  EDUCATION NEEDS:   No education needs identified at this  time  Brenner Visconti A. Mayford KnifeWilliams, RD, LDN, CDE Pager: [phone] After hours Pager: [phone]

## 2016-08-25 NOTE — Progress Notes (Signed)
  Echocardiogram 2D Echocardiogram has been performed.  [name], [name] 08/25/2016, 4:41 PM

## 2016-08-25 NOTE — Procedures (Signed)
EEG REPORT  CLINICAL HISTORY This is a 82yo lady who suffered a cardiac arrest and is now intubated and sedated.  TECHNIQUE This is a routine EEG done at the beside.  The patient is listed as being comatose.  INTERPRETATION The background activity shows near complete suppression of all cortical brain activity.  There is very rare, and very brief, and very amplitude activity seen during the recording.  There was no epileptiform activity seen.  IMPRESSION This is an abnormal EEG due to near complete suppression of all cortical brain activity.  CLINICAL CORRELATION The findings consistent with severe diffuse global brain injury as can be seen from hypoxic-ischemic injury.  If there is concern for the effects of sedation on the EEG results, a repeat study could be obtained after the patient has been off sedation for several hours.  Jacqlyn LarsenS. Rachard Isidro, MD Neurology

## 2016-08-25 NOTE — Progress Notes (Signed)
Patient Name: [name] Date of Encounter: 08/25/2016  Principal Problem:   Cardiac arrest [facility]) Active Problems:   S/P TAVR (transcatheter aortic valve replacement)   Current use of long term anticoagulation   Atrial fibrillation with RVR (HCC)   Acute respiratory failure with hypoxia (HCC)   Cardiogenic shock (HCC)   Length of Stay: 2  SUBJECTIVE  Intubated, sedated, paralyzed.   CURRENT MEDS . amiodarone  150 mg Intravenous Once  . artificial tears  1 application Both Eyes X8V  . ceFEPime (MAXIPIME) IV  1 g Intravenous Q24H  . chlorhexidine gluconate (MEDLINE KIT)  15 mL Mouth Rinse BID  . cisatracurium  0.1 mg/kg Intravenous Once  . famotidine (PEPCID) IV  20 mg Intravenous Q12H  . fentaNYL (SUBLIMAZE) injection  50 mcg Intravenous Once  . furosemide  20 mg Intravenous BID  . insulin aspart  2-6 Units Subcutaneous Q4H  . insulin glargine  20 Units Subcutaneous QHS  . mouth rinse  15 mL Mouth Rinse 10 times per day  . sodium chloride  1,000 mL Intravenous Once  . vancomycin  1,000 mg Intravenous Q24H   . sodium chloride    . amiodarone 150 mg/hr (08/25/16 0827)  . cisatracurium (NIMBEX) infusion 1 mcg/kg/min (08/24/16 0112)  . dextrose    . fentaNYL infusion INTRAVENOUS 250 mcg/hr (08/25/16 0349)  . insulin (NOVOLIN-R) infusion Stopped (08/25/16 0215)  . norepinephrine (LEVOPHED) Adult infusion 33 mcg/min (08/25/16 0831)  . propofol (DIPRIVAN) infusion 40.11 mcg/kg/min (08/25/16 0609)   OBJECTIVE  Vitals:   08/25/16 0400 08/25/16 0500 08/25/16 0600 08/25/16 0700  BP: (!) 134/59     Pulse: (!) 118     Resp: _0 Temp: (!) 93 F (33.9 C) (!) 93.7 F (34.3 C) (!) 94.1 F (34.5 C) (!) 94.5 F (34.7 C)  TempSrc: Core (Comment)     SpO2: 98% 100% 100% 100%  Weight:      Height:        Intake/Output Summary (Last 24 hours) at 08/25/16 0841 Last data filed at 08/25/16 0700  Gross per 24 hour  Intake          2633.67 ml  Output              2470 ml  Net           163.67 ml   Filed Weights   08/15/2016 2200 08/24/16 0000  Weight: 147 lb 7.8 oz (66.9 kg) 154 lb 5.2 oz (70 kg)   PHYSICAL EXAM  Intubated, sedated, paralyzed. . Neck:  without bruits or JVD. Lungs: intubated, decreased breath sounds with diffuse crackles, chest tube on Rt Heart: irreg irreg rhythm, no s3, s4, or murmurs. Abdomen: Soft, non-tender, non-distended, BS diminnished  Extremities: No clubbing, cyanosis or edema, cool. DP/PT/Radials diminnished.  Accessory Clinical Findings  CBC  Recent Labs  08/07/2016 1949  08/24/16 0545  08/24/16 1637 08/25/16 0500  WBC 14.3*  --  19.6*  --   --  18.4*  NEUTROABS 9.2*  --   --   --   --   --   HGB 9.1*  < > 10.9*  < > 11.6* 11.7*  HCT 28.4*  < > 33.6*  < > 34.0* 35.6*  MCV 92.5  --  89.6  --   --  87.9  PLT 217  --  298  --   --  253  < > = values in this interval not displayed. Basic Metabolic Panel  Recent Labs  08/24/16 0545  08/25/16 0020 08/25/16 0500  NA 135  < > 138 138  K 3.3*  < > 3.4* 4.0  CL 101  < > 109 108  CO2 19*  --  18* 17*  GLUCOSE 476*  < > 151* 218*  BUN 26*  < > 22* 23*  CREATININE 0.95  < > 0.68 0.74  CALCIUM 9.4  --  9.0 9.0  MG 1.9  --   --  1.8  PHOS 4.4  --   --  4.1  4.2  < > = values in this interval not displayed. Liver Function Tests  Recent Labs  08/30/2016 1949 08/25/16 0500  AST 72*  --   ALT 29  --   ALKPHOS 79  --   BILITOT 0.3  --   PROT 4.6*  --   ALBUMIN 2.4* 2.9*   No results for input(s): LIPASE, AMYLASE in the last 72 hours. Cardiac Enzymes  Recent Labs  08/24/16 0545 08/24/16 1032 08/24/16 1632  TROPONINI 0.07* 0.05* 0.05*   Dg Chest 2 View  Result Date: 08/21/2016 CLINICAL DATA:  Shortness of breath starting at 8 p.m. and gradually worsening. History of atrial fibrillation and hypertension. EXAM: CHEST  2 VIEW COMPARISON:  07/14/2016 FINDINGS: Postoperative changes in the mediastinum. Cardiac pacemaker. Mild cardiac enlargement.  Increasing interstitial pattern to the lungs mostly in the periphery and lung bases, likely representing developing edema. Small bilateral pleural effusions. No focal consolidation. No pneumothorax. Calcified and tortuous aorta. Degenerative changes in the spine and shoulders. IMPRESSION: Cardiac enlargement with increasing interstitial pattern to the lungs suggesting developing interstitial edema. Small bilateral pleural effusions. Electronically Signed   By: Lucienne Capers M.D.   On: 08/21/2016 01:18   Dg Chest Port 1 View  Result Date: 08/24/2016 CLINICAL DATA:  82 y/o  F; encounter for central line placement. EXAM: PORTABLE CHEST 1 VIEW COMPARISON:  08/12/2016 chest radiograph. FINDINGS: Stable cardiac silhouette given projection and technique. Aortic valve replacement. Right central venous catheter tip projects over upper SVC. Right-sided chest tube unchanged in position. Extensive subcutaneous emphysema over the right-sided chest wall. Single lead pacemaker. Additional electronic device projects over lower mediastinum. Transcutaneous pacing pads on left chest wall. Trace if any residual right-sided pneumothorax with accentuation of silhouettes. Endotracheal tube unchanged in position. Stable patchy airspace opacities. IMPRESSION: Right central venous catheter tip projects over upper SVC. Other lines and tubes are stable. Minimal if any right-sided pneumothorax. Stable patchy airspace opacities may represent edema, pneumonia, or ARDS. Electronically Signed   By: Kristine Garbe M.D.   On: 08/24/2016 04:27   Dg Chest Portable 1 View  Result Date: 08/14/2016 CLINICAL DATA:  Chest tube placement.  Initial encounter. EXAM: PORTABLE CHEST 1 VIEW COMPARISON:  Chest radiograph performed earlier today at 8:40 p.m. FINDINGS: The patient's endotracheal tube is seen ending 4-5 cm above the carina. An enteric tube is noted extending below the diaphragm. A right-sided chest tube is noted. No significant  pneumothorax is seen. Worsening bilateral airspace opacification may reflect pneumonia or pulmonary edema. ARDS is a concern. No definite pleural effusion is seen. The cardiomediastinal silhouette is borderline enlarged. A pacemaker is noted overlying the left chest wall, with a lead ending overlying the right ventricle. A vascular stent is noted overlying the heart. Prominent soft tissue air is noted bilaterally, significantly increased on the right and tracking into the neck. External pacing pads are noted. IMPRESSION: 1. Endotracheal tube seen ending 4-5 cm above the carina.  2. Right-sided chest tubes noted, without definite evidence of pneumothorax. 3. Worsening bilateral airspace opacification may reflect pneumonia or pulmonary edema. ARDS is a concern. 4. Borderline cardiomegaly. 5. Significantly worsened right-sided soft tissue air, tracking into the neck, status post placement of right-sided chest tube. Electronically Signed   By: Garald Balding M.D.   On: 08/15/2016 23:43   Dg Chest Portable 1 View  Result Date: 08/24/2016 CLINICAL DATA:  Repositioning of ET tube EXAM: PORTABLE CHEST 1 VIEW COMPARISON:  08/12/2016 FINDINGS: Interim repositioning of endotracheal tube, the tip is now 4.1 cm superior to the carina. Single lead left-sided pacing device remains in place. Valvular prosthesis again noted. Esophageal tube tip extends below the diaphragm. There is stable cardiomegaly. Hazy bilateral pulmonary infiltrates or edema, do not appear significantly changed. Interval increase in size of right-sided pneumothorax, demonstrating 2.2 cm of pleural-parenchymal separation at the apex. Pneumothorax is seen laterally as well as along the right cardiophrenic sulcus. Increased subcutaneous emphysema over the right lateral chest wall. IMPRESSION: 1. Support lines and tubes as described above. 2. No significant interval change in cardiomegaly and bilateral interstitial and alveolar infiltrates or edema, right  greater than left. 3. Slight interval increase in size of small moderate right-sided pneumothorax. Electronically Signed   By: Donavan Foil M.D.   On: 08/04/2016 21:00   Dg Chest Portable 1 View  Result Date: 08/29/2016 CLINICAL DATA:  Post intubation in CPR EXAM: PORTABLE CHEST 1 VIEW COMPARISON:  08/22/2016 FINDINGS: Endotracheal to appears high above the thoracic inlet approximately 13 cm from the carina. Recommend advancing 6 cm. NG tube in the stomach. Moderate volume RIGHT pneumothorax measures 11 mm from chest wall and occupies approximately 10-15% of lung volume. IMPRESSION: 1. Moderate volume RIGHT pneumothorax. 2. Endotracheal tube is high.  Recommend advancement by 6 cm. Critical Value/emergent results were called by telephone at the time of interpretation on 08/08/2016 at 8:10 pm to Dr. Gareth Morgan , who verbally acknowledged these results. Electronically Signed   By: Suzy Bouchard M.D.   On: 08/13/2016 20:11   Dg Chest Port 1 View  Result Date: 08/22/2016 CLINICAL DATA:  Follow-up pleural effusions.  Subsequent encounter. EXAM: PORTABLE CHEST 1 VIEW COMPARISON:  Chest radiograph performed 08/21/2016 FINDINGS: A small left pleural effusion is again noted, similar in appearance to the prior study. The right-sided pleural effusion appears to have largely resolved. Vascular congestion is noted. Increased interstitial markings may reflect mild interstitial edema or possibly pneumonia. No pneumothorax is seen. The cardiomediastinal silhouette is borderline normal in size. A stent is noted overlying the heart. A pacemaker is noted overlying the left chest wall, with a lead ending overlying the right ventricle. No acute osseous abnormalities are seen. Degenerative change is noted at the right glenohumeral joint. IMPRESSION: Small left pleural effusion, similar in appearance to the prior study. Right-sided pleural effusion appears to have largely resolved. Vascular congestion noted. Increased  interstitial markings may reflect mild interstitial edema or possibly pneumonia. Electronically Signed   By: Garald Balding M.D.   On: 08/22/2016 03:34   TELE: a-fib, 130 BPM  LHC: 06/14/2016 1. Widely patent coronary arteries with no angiographic disease in the LAD, left main, or right coronary artery and minimal irregularity in the left circumflex 2. Calcified aortic valve with restricted leaflet motion and severe aortic stenosis with a mean gradient of 35 mmHg 3. Normal LV systolic function with mild mitral regurgitation by ventriculography 4. Normal aortic root without evidence of aneurysm, estimated angle for valve deployment  LAO 5, caudal 5 5. Aborted right heart catheterization (estimated PASP by echo 37 mmHg)  TTE: 08/03/2016 Left ventricle: The cavity size was normal. Wall thickness was   normal. The estimated ejection fraction was 55%. Wall motion was   normal; there were no regional wall motion abnormalities.   Indeterminant diastolic function (atrial fibrillation). - Aortic valve: There is a bioprosthetic aortic valve s/p TAVR.   Mild peri-valvular regurgitation. No significant stenosis. Mean   gradient (S): 11 mm Hg. - Mitral valve: Moderately calcified annulus. Moderately calcified   leaflets . There was mild regurgitation. - Left atrium: The atrium was moderately dilated. - Right ventricle: The cavity size was normal. Pacer wire or   catheter noted in right ventricle. Systolic function was normal. - Right atrium: The atrium was moderately dilated. - Tricuspid valve: Peak RV-RA gradient (S): 35 mm Hg. - Pulmonary arteries: PA peak pressure: 43 mm Hg (S). - Systemic veins: IVC measured 2.0 cm with < 50% respirophasic   variation, suggestiing RA pressure 8 mmHg.  Impressions: - The patient was in atrial fibrillation. Normal LV size with EF   55%. Normal RV size and systolic function. Moderate biatrial   enlargement. Bioprosthetic aortic valve s/p TAVR. Mild    peri-valvular aortic insufficiency. Mild mitral regurgitation.   Mild pulmonary hypertension.    ASSESSMENT AND PLAN  82 year old female with a past medical history of TAVR (72m EParticia Lather3 valve) on 196/11/2295 chronic diastolic CHF, DM, HLD, HTN, and  tachy-brady syndrome s/p SJM single chamber PPM, now in persistent a-fib. LVEF 55%. She presented to the ED on 08/18/2016 with SOB and rapid Afib associated with SOB. She was started on iv bilateral pleural effusions.  She was diuresed and HR was controlled, discharged yesterday in the morning. Normal electrolytes and crea at the time. She was at home and her family witnessed her lose consciousness with immediate CPR attempt, in VF upon EMS arrival, back to SR after to shocks, required epinephrine for hypotension.  In a-fib with RVR on arrival to the hospital.  1. VF arrest - unknown cause, she had clean coronaries in 06/2016, s/p TAVR with normally functioning valve and normal LVEF, normal electrolytes including K, Na, Mg.  Troponin 0.07 x 3 with flat trend most probably sec to shocks, CPR and hypotension.  High lactic acid on arrival 5.7.  She is on hypothermia protocol now. She has a SEngineer, petroleum placed 2009 IV amiodarone started 11/23- initially HR cae down, now back to 130 ? A-fib with RVR --> VF?  2. Atrial fibrillation with RVR: On IV amiodarone, she had difficulties to manage her HR despite high doses of metoprolol, diltiazem and digoxin, amiodarone can also prevent VTs.  3. Acute on chronic diastolic CHF: after CPR and fluid resuscitation. On lasix 20 mg iv BID, decreased urine output last few hours.  4. HLD: On statin, continue same.   5. Hypokalemia - replaced   6. Rt pneumothorax- chest tube placed  7. Shock- on IV pressors  She remains critically ill. I gave her an Amiodarone 150 mg bolus this am for her rate.  Pacer interrogated yesterday, episode triggers were off, she is in VVI. We will monitor closely, MD to  see.   SAngelena FormPA 08/25/2016

## 2016-08-25 NOTE — Progress Notes (Signed)
Inpatient Diabetes Program Recommendations  AACE/ADA: New Consensus Statement on Inpatient Glycemic Control (2015)  Target Ranges:  Prepandial:   less than 140 mg/dL      Peak postprandial:   less than 180 mg/dL (1-2 hours)      Critically ill patients:  140 - 180 mg/dL   Lab Results  Component Value Date   GLUCAP 224 (H) 08/25/2016   HGBA1C 7.1 (H) 06/29/2016    Review of Glycemic Control:  Results for [name], [name] (MRN [id number]) as of 08/25/2016 09:59  Ref. Range 08/24/2016 23:10 08/25/2016 00:14 08/25/2016 01:20 08/25/2016 05:01 08/25/2016 08:59  Glucose-Capillary Latest Ref Range: 65 - 99 mg/dL 409133 (H) 811158 (H) 914159 (H) 219 (H) 224 (H)   Diabetes history: Type 2 diabetes Outpatient Diabetes medications: Metformin 1000 mg bid Current orders for Inpatient glycemic control:  2-4-6 q 4 hours, Lantus 20 units daily  Inpatient Diabetes Program Recommendations:    Note that patient transitioned overnight off insulin drip.  May consider increasing Lantus to 30 units daily.  Once tube feeds started, may consider also adding tube feed coverage 3 units q 4 hours.   Thanks, Beryl MeagerJenny Lavora Brisbon, RN, BC-ADM Inpatient Diabetes Coordinator Pager [phone] (8a-5p)

## 2016-08-25 NOTE — Progress Notes (Addendum)
Patient Name: [name] Date of Encounter: 08/25/2016  Principal Problem:   Cardiac arrest [facility]) Active Problems:   S/P TAVR (transcatheter aortic valve replacement)   Current use of long term anticoagulation   Atrial fibrillation with RVR (HCC)   Acute respiratory failure with hypoxia (HCC)   Cardiogenic shock (HCC)   Length of Stay: 2  SUBJECTIVE  Intubated, sedated, paralyzed. Started on Amiodarone for a-fib with RVR.  CURRENT MEDS . artificial tears  1 application Both Eyes S5K  . ceFEPime (MAXIPIME) IV  1 g Intravenous Q24H  . chlorhexidine gluconate (MEDLINE KIT)  15 mL Mouth Rinse BID  . cisatracurium  0.1 mg/kg Intravenous Once  . famotidine (PEPCID) IV  20 mg Intravenous Q12H  . fentaNYL (SUBLIMAZE) injection  50 mcg Intravenous Once  . furosemide  20 mg Intravenous BID  . insulin aspart  0-20 Units Subcutaneous Q4H  . mouth rinse  15 mL Mouth Rinse 10 times per day  . sodium chloride  1,000 mL Intravenous Once  . vancomycin  1,000 mg Intravenous Q24H   . sodium chloride    . amiodarone 150 mg/hr (08/25/16 0827)  . cisatracurium (NIMBEX) infusion 1 mcg/kg/min (08/24/16 0112)  . fentaNYL infusion INTRAVENOUS 250 mcg/hr (08/25/16 0349)  . insulin (NOVOLIN-R) infusion Stopped (08/25/16 0215)  . norepinephrine (LEVOPHED) Adult infusion 33 mcg/min (08/25/16 0831)  . propofol (DIPRIVAN) infusion 40.11 mcg/kg/min (08/25/16 0609)   OBJECTIVE  Vitals:   08/25/16 1000 08/25/16 1100 08/25/16 1131 08/25/16 1200  BP:      Pulse:      Resp: '16 16 16   '$ Temp: (!) 95.9 F (35.5 C) (!) 96.6 F (35.9 C)  97.5 F (36.4 C)  TempSrc: Core (Comment) Core (Comment)  Core (Comment)  SpO2: 100% 100%    Weight:      Height:        Intake/Output Summary (Last 24 hours) at 08/25/16 1241 Last data filed at 08/25/16 1200  Gross per 24 hour  Intake          2598.43 ml  Output             1805 ml  Net           793.43 ml   Filed Weights   08/28/2016 2200 08/24/16 0000    Weight: 147 lb 7.8 oz (66.9 kg) 154 lb 5.2 oz (70 kg)   PHYSICAL EXAM  Intubated, sedated, paralyzed. . Neck:  without bruits or JVD. Lungs: intubated, decreased breath sounds with diffuse crackles, chest tube on Rt Heart: irreg irreg rhythm, no s3, s4, or murmurs. Abdomen: Soft, non-tender, non-distended, BS diminnished  Extremities: No clubbing, cyanosis or edema, cool. DP/PT/Radials diminnished.  Accessory Clinical Findings  CBC  Recent Labs  08/26/2016 1949  08/24/16 0545  08/24/16 1637 08/25/16 0500  WBC 14.3*  --  19.6*  --   --  18.4*  NEUTROABS 9.2*  --   --   --   --   --   HGB 9.1*  < > 10.9*  < > 11.6* 11.7*  HCT 28.4*  < > 33.6*  < > 34.0* 35.6*  MCV 92.5  --  89.6  --   --  87.9  PLT 217  --  298  --   --  253  < > = values in this interval not displayed. Basic Metabolic Panel  Recent Labs  08/24/16 0545  08/25/16 0500 08/25/16 0930  NA 135  < > 138 138  K 3.3*  < > 4.0 4.4  CL 101  < > 108 109  CO2 19*  < > 17* 18*  GLUCOSE 476*  < > 218* 261*  BUN 26*  < > 23* 23*  CREATININE 0.95  < > 0.74 0.84  CALCIUM 9.4  < > 9.0 8.4*  MG 1.9  --  1.8  --   PHOS 4.4  --  4.1  4.2  --   < > = values in this interval not displayed. Liver Function Tests  Recent Labs  08/30/2016 1949 08/25/16 0500  AST 72*  --   ALT 29  --   ALKPHOS 79  --   BILITOT 0.3  --   PROT 4.6*  --   ALBUMIN 2.4* 2.9*   No results for input(s): LIPASE, AMYLASE in the last 72 hours. Cardiac Enzymes  Recent Labs  08/24/16 0545 08/24/16 1032 08/24/16 1632  TROPONINI 0.07* 0.05* 0.05*   Dg Chest 2 View  Result Date: 08/21/2016 CLINICAL DATA:  Shortness of breath starting at 8 p.m. and gradually worsening. History of atrial fibrillation and hypertension. EXAM: CHEST  2 VIEW COMPARISON:  07/14/2016 FINDINGS: Postoperative changes in the mediastinum. Cardiac pacemaker. Mild cardiac enlargement. Increasing interstitial pattern to the lungs mostly in the periphery and lung bases,  likely representing developing edema. Small bilateral pleural effusions. No focal consolidation. No pneumothorax. Calcified and tortuous aorta. Degenerative changes in the spine and shoulders. IMPRESSION: Cardiac enlargement with increasing interstitial pattern to the lungs suggesting developing interstitial edema. Small bilateral pleural effusions. Electronically Signed   By: Burman Nieves M.D.   On: 08/21/2016 01:18   Dg Chest Port 1 View  Result Date: 08/24/2016 CLINICAL DATA:  82 y/o  F; encounter for central line placement. EXAM: PORTABLE CHEST 1 VIEW COMPARISON:  08/30/2016 chest radiograph. FINDINGS: Stable cardiac silhouette given projection and technique. Aortic valve replacement. Right central venous catheter tip projects over upper SVC. Right-sided chest tube unchanged in position. Extensive subcutaneous emphysema over the right-sided chest wall. Single lead pacemaker. Additional electronic device projects over lower mediastinum. Transcutaneous pacing pads on left chest wall. Trace if any residual right-sided pneumothorax with accentuation of silhouettes. Endotracheal tube unchanged in position. Stable patchy airspace opacities. IMPRESSION: Right central venous catheter tip projects over upper SVC. Other lines and tubes are stable. Minimal if any right-sided pneumothorax. Stable patchy airspace opacities may represent edema, pneumonia, or ARDS. Electronically Signed   By: Mitzi Hansen M.D.   On: 08/24/2016 04:27   Dg Chest Portable 1 View  Result Date: 08/12/2016 CLINICAL DATA:  Chest tube placement.  Initial encounter. EXAM: PORTABLE CHEST 1 VIEW COMPARISON:  Chest radiograph performed earlier today at 8:40 p.m. FINDINGS: The patient's endotracheal tube is seen ending 4-5 cm above the carina. An enteric tube is noted extending below the diaphragm. A right-sided chest tube is noted. No significant pneumothorax is seen. Worsening bilateral airspace opacification may reflect  pneumonia or pulmonary edema. ARDS is a concern. No definite pleural effusion is seen. The cardiomediastinal silhouette is borderline enlarged. A pacemaker is noted overlying the left chest wall, with a lead ending overlying the right ventricle. A vascular stent is noted overlying the heart. Prominent soft tissue air is noted bilaterally, significantly increased on the right and tracking into the neck. External pacing pads are noted. IMPRESSION: 1. Endotracheal tube seen ending 4-5 cm above the carina. 2. Right-sided chest tubes noted, without definite evidence of pneumothorax. 3. Worsening bilateral airspace opacification may reflect pneumonia or  pulmonary edema. ARDS is a concern. 4. Borderline cardiomegaly. 5. Significantly worsened right-sided soft tissue air, tracking into the neck, status post placement of right-sided chest tube. Electronically Signed   By: Garald Balding M.D.   On: 08/26/2016 23:43   Dg Chest Portable 1 View  Result Date: 08/06/2016 CLINICAL DATA:  Repositioning of ET tube EXAM: PORTABLE CHEST 1 VIEW COMPARISON:  08/31/2016 FINDINGS: Interim repositioning of endotracheal tube, the tip is now 4.1 cm superior to the carina. Single lead left-sided pacing device remains in place. Valvular prosthesis again noted. Esophageal tube tip extends below the diaphragm. There is stable cardiomegaly. Hazy bilateral pulmonary infiltrates or edema, do not appear significantly changed. Interval increase in size of right-sided pneumothorax, demonstrating 2.2 cm of pleural-parenchymal separation at the apex. Pneumothorax is seen laterally as well as along the right cardiophrenic sulcus. Increased subcutaneous emphysema over the right lateral chest wall. IMPRESSION: 1. Support lines and tubes as described above. 2. No significant interval change in cardiomegaly and bilateral interstitial and alveolar infiltrates or edema, right greater than left. 3. Slight interval increase in size of small moderate  right-sided pneumothorax. Electronically Signed   By: Donavan Foil M.D.   On: 08/11/2016 21:00   Dg Chest Portable 1 View  Result Date: 08/22/2016 CLINICAL DATA:  Post intubation in CPR EXAM: PORTABLE CHEST 1 VIEW COMPARISON:  08/22/2016 FINDINGS: Endotracheal to appears high above the thoracic inlet approximately 13 cm from the carina. Recommend advancing 6 cm. NG tube in the stomach. Moderate volume RIGHT pneumothorax measures 11 mm from chest wall and occupies approximately 10-15% of lung volume. IMPRESSION: 1. Moderate volume RIGHT pneumothorax. 2. Endotracheal tube is high.  Recommend advancement by 6 cm. Critical Value/emergent results were called by telephone at the time of interpretation on 08/30/2016 at 8:10 pm to Dr. Gareth Morgan , who verbally acknowledged these results. Electronically Signed   By: Suzy Bouchard M.D.   On: 08/24/2016 20:11   Dg Chest Port 1 View  Result Date: 08/22/2016 CLINICAL DATA:  Follow-up pleural effusions.  Subsequent encounter. EXAM: PORTABLE CHEST 1 VIEW COMPARISON:  Chest radiograph performed 08/21/2016 FINDINGS: A small left pleural effusion is again noted, similar in appearance to the prior study. The right-sided pleural effusion appears to have largely resolved. Vascular congestion is noted. Increased interstitial markings may reflect mild interstitial edema or possibly pneumonia. No pneumothorax is seen. The cardiomediastinal silhouette is borderline normal in size. A stent is noted overlying the heart. A pacemaker is noted overlying the left chest wall, with a lead ending overlying the right ventricle. No acute osseous abnormalities are seen. Degenerative change is noted at the right glenohumeral joint. IMPRESSION: Small left pleural effusion, similar in appearance to the prior study. Right-sided pleural effusion appears to have largely resolved. Vascular congestion noted. Increased interstitial markings may reflect mild interstitial edema or possibly  pneumonia. Electronically Signed   By: Garald Balding M.D.   On: 08/22/2016 03:34   TELE: a-fib, 130 BPM  LHC: 06/14/2016 1. Widely patent coronary arteries with no angiographic disease in the LAD, left main, or right coronary artery and minimal irregularity in the left circumflex 2. Calcified aortic valve with restricted leaflet motion and severe aortic stenosis with a mean gradient of 35 mmHg 3. Normal LV systolic function with mild mitral regurgitation by ventriculography 4. Normal aortic root without evidence of aneurysm, estimated angle for valve deployment LAO 5, caudal 5 5. Aborted right heart catheterization (estimated PASP by echo 37 mmHg)  TTE: 08/03/2016 Left  ventricle: The cavity size was normal. Wall thickness was   normal. The estimated ejection fraction was 55%. Wall motion was   normal; there were no regional wall motion abnormalities.   Indeterminant diastolic function (atrial fibrillation). - Aortic valve: There is a bioprosthetic aortic valve s/p TAVR.   Mild peri-valvular regurgitation. No significant stenosis. Mean   gradient (S): 11 mm Hg. - Mitral valve: Moderately calcified annulus. Moderately calcified   leaflets . There was mild regurgitation. - Left atrium: The atrium was moderately dilated. - Right ventricle: The cavity size was normal. Pacer wire or   catheter noted in right ventricle. Systolic function was normal. - Right atrium: The atrium was moderately dilated. - Tricuspid valve: Peak RV-RA gradient (S): 35 mm Hg. - Pulmonary arteries: PA peak pressure: 43 mm Hg (S). - Systemic veins: IVC measured 2.0 cm with < 50% respirophasic   variation, suggestiing RA pressure 8 mmHg.  Impressions: - The patient was in atrial fibrillation. Normal LV size with EF   55%. Normal RV size and systolic function. Moderate biatrial   enlargement. Bioprosthetic aortic valve s/p TAVR. Mild   peri-valvular aortic insufficiency. Mild mitral regurgitation.   Mild pulmonary  hypertension.    ASSESSMENT AND PLAN  82 year old female with a past medical history of TAVR (53m EParticia Lather3 valve) on 140/06/8118 chronic diastolic CHF, DM, HLD, HTN, and  tachy-brady syndrome s/p SJM single chamber PPM, now in persistent a-fib. LVEF 55%. She presented to the ED on 08/05/2016 with SOB and rapid Afib associated with SOB. She was started on iv bilateral pleural effusions.  She was diuresed and HR was controlled, discharged yesterday in the morning. Normal electrolytes and crea at the time. She was at home and her family witnessed her lose consciousness with immediate CPR attempt, in VF upon EMS arrival, back to SR after to shocks, required epinephrine for hypotension.  In a-fib with RVR on arrival to the hospital.  1. VF arrest - unknown cause, she had clean coronaries in 06/2016, s/p TAVR with normally functioning valve and normal LVEF, normal electrolytes including K, Na, Mg. ? A-fib with RVR --> VF? Troponin 0.07 x 3 with flat trend most probably sec to shocks, CPR and hypotension.  High lactic acid on arrival 5.7.  She is on hypothermia protocol now. She has a SEngineer, petroleum placed 2009 IV amiodarone started this am as a-fib with RVR up to 150 BPM. I wanted to start it yesterday but family reluctant as there is listed allergy and per sister it runs in their family, however they ar unable to find out what was the side effect. In case it was bradycardia, she now has a PM so it should be safe.   2. Atrial fibrillation with RVR: On IV amiodarone, she had difficulties to manage her HR despite high doses of metoprolol, diltiazem and digoxin, amiodarone can also prevent VTs.  3. Acute on chronic diastolic CHF: after CPR and fluid resuscitation. On lasix 20 mg iv BID, positive fluid balance, however hypotension, I would continue the same dose.  4. HLD: On statin, continue same.   5. Hypokalemia - replaced   6. Rt pneumothorax- chest tube placed  7. Shock- on IV  pressors  She remains critically ill. I gave her an Amiodarone 150 mg bolus this am for her rate.  Pacer interrogated yesterday, episode triggers were off, she is in VVI. We will monitor closely, MD to see.   Signed, KEna Dawley MD 08/25/2016

## 2016-08-25 NOTE — Progress Notes (Signed)
PULMONARY / CRITICAL CARE MEDICINE   Name: [name] MRN: [id number] DOB: 17-Oct-1926    ADMISSION DATE:  08-08-2016 CONSULTATION DATE:  08-08-2016  REFERRING MD: Dr. Dalene SeltzerSchlossman  CHIEF COMPLAINT: Cardiac arrest  HISTORY OF PRESENT ILLNESS:  82 y.o. female with extensive cardiac history with recent admission for TAVR, which was without complication and she recovered well from. She has been admitted twice since then with syncope and then pulmonary edema secondary to AF RVR. She is on coumadin. She was just discharged 11/22 from her AFRVR admission and was doing well, however, later that day while sitting at home and talking on the phone she became unresponsive. Witnessed by nephew who started CPR after calling EMS. 10 mins later EMS arrived and noted her to be in VF. She was defibrillated and given several rounds of epinephrine. Down-time estimated at about 30 mins. In ED she was intubated and found to have R sided pneumothorax. PCCM asked to admit.  SUBJECTIVE:  Undergoing rewarming now  Remains on norepi 33 Remains in A fib, received amio bolus this am  REVIEW OF SYSTEMS:  Unable to obtain with patient intubated & sedated.  VITAL SIGNS: BP 112/66 (BP Location: Right Arm)   Pulse (!) 118   Temp (!) 95.9 F (35.5 C) (Core (Comment))   Resp 16   Ht 5\' 6"  (1.676 m)   Wt 70 kg (154 lb 5.2 oz)   SpO2 100%   BMI 24.91 kg/m   HEMODYNAMICS: CVP:  [8 mmHg-9 mmHg] 9 mmHg  VENTILATOR SETTINGS: Vent Mode: PRVC FiO2 (%):  [40 %] 40 % Set Rate:  [16 bmp] 16 bmp Vt Set:  [500 mL] 500 mL PEEP:  [5 cmH20] 5 cmH20 Plateau Pressure:  [19 cmH20-20 cmH20] 20 cmH20  INTAKE / OUTPUT: I/O last 3 completed shifts: In: 3619.4 [I.V.:3149.4; NG/GT:120; IV Piggyback:350] Out: 3565 [Urine:3355; Chest Tube:210]  PHYSICAL EXAMINATION: General:  Elderly female. No family at bedside. Neuro:  Sedated and paralyzed. Pupils pinpoint. HEENT:  Endotracheal tube in place. No scleral icterus or  injection. Cardiovascular:  Regular rate. No edema. Unable to appreciate JVD.  Pulmonary: Symmetric chest wall rise on ventilator. Clear bilaterally to auscultation.  Abdomen:  Soft. Nondistended. Integument: Warm and dry. No rash on exposed skin.   LABS:  BMET  Recent Labs Lab 08/24/16 0545  08/24/16 1637 08/25/16 0020 08/25/16 0500  NA 135  < > 142 138 138  K 3.3*  < > 3.1* 3.4* 4.0  CL 101  < > 106 109 108  CO2 19*  --   --  18* 17*  BUN 26*  < > 24* 22* 23*  CREATININE 0.95  < > 0.60 0.68 0.74  GLUCOSE 476*  < > 131* 151* 218*  < > = values in this interval not displayed.  Electrolytes  Recent Labs Lab 2016-07-20 1949  08/24/16 0545 08/25/16 0020 08/25/16 0500  CALCIUM 6.2*  < > 9.4 9.0 9.0  MG 1.7  --  1.9  --  1.8  PHOS  --   --  4.4  --  4.1  4.2  < > = values in this interval not displayed.  CBC  Recent Labs Lab 2016-07-20 1949  08/24/16 0545  08/24/16 1241 08/24/16 1637 08/25/16 0500  WBC 14.3*  --  19.6*  --   --   --  18.4*  HGB 9.1*  < > 10.9*  < > 11.6* 11.6* 11.7*  HCT 28.4*  < > 33.6*  < > 34.0*  34.0* 35.6*  PLT 217  --  298  --   --   --  253  < > = values in this interval not displayed.  Coag's  Recent Labs Lab 08/29/2016 2344  2016/09/04 0239 08/24/16 0630 08/25/16 0500  APTT 55*  --   --  43*  --   INR 4.31*  < > 3.01 2.20 3.14  < > = values in this interval not displayed.  Sepsis Markers  Recent Labs Lab 09-04-2016 1952 09-04-16 2251  LATICACIDVEN 5.71* 4.48*    ABG  Recent Labs Lab 09-04-16 2007 08/24/16 0223  PHART 7.276* 7.349*  PCO2ART 43.3 36.7  PO2ART 148.0* 94.0    Liver Enzymes  Recent Labs Lab 08/21/16 0650 2016-09-04 1949 08/25/16 0500  AST 31 72*  --   ALT 16 29  --   ALKPHOS 81 79  --   BILITOT 1.1 0.3  --   ALBUMIN 3.9 2.4* 2.9*    Cardiac Enzymes  Recent Labs Lab 08/24/16 0545 08/24/16 1032 08/24/16 1632  TROPONINI 0.07* 0.05* 0.05*    Glucose  Recent Labs Lab 08/24/16 2202  08/24/16 2310 08/25/16 0014 08/25/16 0120 08/25/16 0501 08/25/16 0859  GLUCAP 143* 133* 158* 159* 219* 224*    Imaging No results found.   STUDIES:  EEG 11/23 >> CT Head w/o 11/23 >>  MICROBIOLOGY: MRSA PCR 11/20:  Negative MRSA PCR 11/23:  Negative Blood Ctx x2 11/22 >>  ANTIBIOTICS: Cefepime 11/22 >> Vancomycin 11/22 >>  SIGNIFICANT EVENTS: 11/22 - discharge, subsequent cardiac arrest  LINES/TUBES: OETT 7.0 11/22 >> R Chest Tube 11/22 >> R IJ CVL 11/23 >> L Radial Art Line 11/23 >> Foley 11/22 >> OGT 11/22 >> PIV  ASSESSMENT / PLAN:  PULMONARY A: Acute Hypoxic Respiratory Failure - Secondary to cardiac arrest. Right Sided Pneumothorax - S/P Chest tube.  P:   Full vent support Intermittent ABG & Portable CXR Continuing Chest Tube to Suction SBT & PS Wean after re-warming as MS will allow  CARDIOVASCULAR A:  Shock - Likely cardiogenic.  S/P Cardiac Arrest - Ventricular fibrillation. Acute on Chronic Systolic CHF Atrial Fibrillation with RVR - Repeated admissions. Now on amiodarone, had some bradycardia but unclear that related to the medication.  Cardiac arrest Acute on chronic systolic CHF Atrial fibrillation with RVR S/P TAVR 07/2016 S/P Pacemaker H/O Aortic Stenosis, HTN, & HLD  P:  Tolerating amiodarone, has a pacer that should be protective should she have adverse brady w the amio; has been tachy thus far Continuous Telemetry Monitoring Vitals per unit protocol Levophed drip for Goal MAP >80 Cardiology Consulted - Appreciate recommendations Lasix IV bid Complete Echocardiogram Pending  RENAL A:   Hypokalemia - Mild.  Oliguria   P:   Trending UOP with Foley On lasix q12h Monitoring electrolytes & renal function daily as well as per protocol Replacing electrolytes as indicated  GASTROINTESTINAL A:   No acute issues  P:   NPO Pepcid IV q12hr Holding on tube feedings  HEMATOLOGIC A:   Coagulopathy - Secondary to  Warfarin. Anemia - No signs of active bleeding.   P:  Trending cell counts w/ CBC Trending INR daily Holding Warfarin Heparin gtt per pharmacy protocol  INFECTIOUS A:   No evidence of acute infection.  P:   Empiric Vancomycin & Cefepime Day #3 Awaiting culture results Trending procalcitonin per algorithm   ENDOCRINE A:   H/O DM    P:   CBG per protocol Insulin gtt initiated Accu-Checks q1hr  NEUROLOGIC A:   Acute Encephalopathy - Likely anoxic. Sedation on Ventilator  P:   RASS goal: Per Hypothermia Protocol Propofol gtt Fentanyl gtt Rewarming to be completed this pm 11/24, assess MS at that time.  Checking Portable Head CT w/o when able > probably Monday  Nimbex prn to prevent shivering Sedation per Hypothermia Protocol  FAMILY  - Updates: HCPOA Earnstine RegalBeth Middleton updated via phone by Dr. Jamison NeighborNestor 11/23.   - Inter-disciplinary family meet or Palliative Care meeting due by:  11/29   TODAY'S SUMMARY:  82 y.o. female with cardiac history just discharged 11/22 presents same day after cardiac arrest 30 mins VF. R sided PTX CT placed in ED. Rewarming now.   I have spent a total of 34 minutes of critical care time today caring for the patient and reviewing the patient's electronic medical record as well as updating family at bedside.  Levy Pupaobert Admiral Marcucci, MD, PhD 08/25/2016, 10:19 AM [facility] [phone] or if no answer [phone]

## 2016-08-25 NOTE — Progress Notes (Signed)
[facility]  Patient Status: Active (receiving services up to time of hospitalization)  [facility] is providing the following services: RN and PT  If patient discharges after hours, please call [phone].   Kizzie FurnishDonna Fellmy 08/25/2016, 11:25 AM

## 2016-08-25 NOTE — Progress Notes (Signed)
eLink Physician-Brief Progress Note Patient Name: [name] DOB: [birth year] MRN: [id number]   Date of Service  08/25/2016  HPI/Events of Note  cbg 253  eICU Interventions  Ok for lantus restart Then if still hight, insulin gtt     Intervention Category Evaluation Type: Other  [name] 08/25/2016, 4:18 PM

## 2016-08-25 NOTE — Progress Notes (Signed)
eLink Physician-Brief Progress Note Patient Name: Loma BostonMillie H Berhane DOB: 20-Sep-1927 MRN: [id number]   Date of Service  08/25/2016  HPI/Events of Note  A fib rvr 147 despite amio gtt. On levophed . REwarmed +  eICU Interventions  Directed RN to call cards     Intervention Category Major Interventions: Arrhythmia - evaluation and management  Jospeh Mangel 08/25/2016, 7:20 PM

## 2016-08-26 ENCOUNTER — Inpatient Hospital Stay (HOSPITAL_COMMUNITY): Payer: Medicare Other

## 2016-08-26 DIAGNOSIS — D689 Coagulation defect, unspecified: Secondary | ICD-10-CM

## 2016-08-26 DIAGNOSIS — R739 Hyperglycemia, unspecified: Secondary | ICD-10-CM

## 2016-08-26 DIAGNOSIS — E875 Hyperkalemia: Secondary | ICD-10-CM

## 2016-08-26 DIAGNOSIS — R570 Cardiogenic shock: Secondary | ICD-10-CM

## 2016-08-26 DIAGNOSIS — I4891 Unspecified atrial fibrillation: Secondary | ICD-10-CM

## 2016-08-26 LAB — CBC
HEMATOCRIT: 33.9 % — AB (ref 36.0–46.0)
HEMOGLOBIN: 10.8 g/dL — AB (ref 12.0–15.0)
MCH: 28.9 pg (ref 26.0–34.0)
MCHC: 31.9 g/dL (ref 30.0–36.0)
MCV: 90.6 fL (ref 78.0–100.0)
Platelets: 291 10*3/uL (ref 150–400)
RBC: 3.74 MIL/uL — ABNORMAL LOW (ref 3.87–5.11)
RDW: 15 % (ref 11.5–15.5)
WBC: 16.4 10*3/uL — ABNORMAL HIGH (ref 4.0–10.5)

## 2016-08-26 LAB — BASIC METABOLIC PANEL
Anion gap: 10 (ref 5–15)
BUN: 35 mg/dL — AB (ref 6–20)
CALCIUM: 8.6 mg/dL — AB (ref 8.9–10.3)
CO2: 18 mmol/L — ABNORMAL LOW (ref 22–32)
CREATININE: 1.47 mg/dL — AB (ref 0.44–1.00)
Chloride: 106 mmol/L (ref 101–111)
GFR, EST AFRICAN AMERICAN: 36 mL/min — AB (ref >60)
GFR, EST NON AFRICAN AMERICAN: 31 mL/min — AB (ref >60)
Glucose, Bld: 145 mg/dL — ABNORMAL HIGH (ref 65–99)
Potassium: 4.8 mmol/L (ref 3.5–5.1)
SODIUM: 134 mmol/L — AB (ref 135–145)

## 2016-08-26 LAB — RENAL FUNCTION PANEL
ALBUMIN: 2.7 g/dL — AB (ref 3.5–5.0)
ANION GAP: 10 (ref 5–15)
BUN: 29 mg/dL — AB (ref 6–20)
CHLORIDE: 106 mmol/L (ref 101–111)
CO2: 18 mmol/L — ABNORMAL LOW (ref 22–32)
Calcium: 8.8 mg/dL — ABNORMAL LOW (ref 8.9–10.3)
Creatinine, Ser: 1.3 mg/dL — ABNORMAL HIGH (ref 0.44–1.00)
GFR calc Af Amer: 42 mL/min — ABNORMAL LOW (ref >60)
GFR calc non Af Amer: 36 mL/min — ABNORMAL LOW (ref >60)
GLUCOSE: 264 mg/dL — AB (ref 65–99)
PHOSPHORUS: 6.9 mg/dL — AB (ref 2.5–4.6)
POTASSIUM: 5.8 mmol/L — AB (ref 3.5–5.1)
Sodium: 134 mmol/L — ABNORMAL LOW (ref 135–145)

## 2016-08-26 LAB — POCT I-STAT, CHEM 8
BUN: 22 mg/dL — ABNORMAL HIGH (ref 6–20)
CALCIUM ION: 1.21 mmol/L (ref 1.15–1.40)
CHLORIDE: 108 mmol/L (ref 101–111)
Creatinine, Ser: 0.5 mg/dL (ref 0.44–1.00)
GLUCOSE: 94 mg/dL (ref 65–99)
HCT: 36 % (ref 36.0–46.0)
HEMOGLOBIN: 12.2 g/dL (ref 12.0–15.0)
Potassium: 3 mmol/L — ABNORMAL LOW (ref 3.5–5.1)
SODIUM: 143 mmol/L (ref 135–145)
TCO2: 22 mmol/L (ref 0–100)

## 2016-08-26 LAB — GLUCOSE, CAPILLARY
GLUCOSE-CAPILLARY: 160 mg/dL — AB (ref 65–99)
GLUCOSE-CAPILLARY: 170 mg/dL — AB (ref 65–99)
GLUCOSE-CAPILLARY: 210 mg/dL — AB (ref 65–99)
GLUCOSE-CAPILLARY: 238 mg/dL — AB (ref 65–99)
GLUCOSE-CAPILLARY: 240 mg/dL — AB (ref 65–99)
GLUCOSE-CAPILLARY: 251 mg/dL — AB (ref 65–99)
Glucose-Capillary: 209 mg/dL — ABNORMAL HIGH (ref 65–99)
Glucose-Capillary: 175 mg/dL — ABNORMAL HIGH (ref 65–99)
Glucose-Capillary: 155 mg/dL — ABNORMAL HIGH (ref 65–99)
Glucose-Capillary: 146 mg/dL — ABNORMAL HIGH (ref 65–99)
Glucose-Capillary: 139 mg/dL — ABNORMAL HIGH (ref 65–99)
Glucose-Capillary: 150 mg/dL — ABNORMAL HIGH (ref 65–99)
Glucose-Capillary: 169 mg/dL — ABNORMAL HIGH (ref 65–99)
Glucose-Capillary: 169 mg/dL — ABNORMAL HIGH (ref 65–99)
Glucose-Capillary: 153 mg/dL — ABNORMAL HIGH (ref 65–99)

## 2016-08-26 LAB — MAGNESIUM: MAGNESIUM: 2.3 mg/dL (ref 1.7–2.4)

## 2016-08-26 LAB — PROTIME-INR
INR: 3.66
Prothrombin Time: 37.3 seconds — ABNORMAL HIGH (ref 11.4–15.2)

## 2016-08-26 MED ORDER — FENTANYL CITRATE (PF) 100 MCG/2ML IJ SOLN
25.0000 ug | INTRAMUSCULAR | Status: DC | PRN
  Administered 2016-08-27: 50 ug via INTRAVENOUS
  Filled 2016-08-26: qty 2

## 2016-08-26 MED ORDER — SODIUM POLYSTYRENE SULFONATE 15 GM/60ML PO SUSP
15.0000 g | Freq: Once | ORAL | Status: AC
  Administered 2016-08-26: 15 g
  Filled 2016-08-26: qty 60

## 2016-08-26 MED ORDER — INSULIN GLARGINE 100 UNIT/ML ~~LOC~~ SOLN
10.0000 [IU] | SUBCUTANEOUS | Status: DC
  Administered 2016-08-26: 10 [IU] via SUBCUTANEOUS
  Filled 2016-08-26 (×2): qty 0.1

## 2016-08-26 MED ORDER — FAMOTIDINE IN NACL 20-0.9 MG/50ML-% IV SOLN
20.0000 mg | INTRAVENOUS | Status: DC
  Administered 2016-08-26: 20 mg via INTRAVENOUS
  Filled 2016-08-26: qty 50

## 2016-08-26 MED ORDER — INSULIN ASPART 100 UNIT/ML ~~LOC~~ SOLN
2.0000 [IU] | SUBCUTANEOUS | Status: DC
  Administered 2016-08-27: 4 [IU] via SUBCUTANEOUS

## 2016-08-26 MED ORDER — MIDAZOLAM HCL 2 MG/2ML IJ SOLN
1.0000 mg | INTRAMUSCULAR | Status: DC | PRN
  Administered 2016-08-27: 2 mg via INTRAVENOUS
  Filled 2016-08-26: qty 2

## 2016-08-26 NOTE — Progress Notes (Signed)
Patient Name: [name] Date of Encounter: 08/26/2016  Principal Problem:   Cardiac arrest [facility]) Active Problems:   S/P TAVR (transcatheter aortic valve replacement)   Current use of long term anticoagulation   Atrial fibrillation with RVR (HCC)   Acute respiratory failure with hypoxia (HCC)   Pneumothorax, right   Cardiogenic shock (HCC)   Length of Stay: 3  SUBJECTIVE 82 year old female with a past medical history of TAVR (11m EParticia Lather3 valve) on 126/12/1581 chronic diastolic CHF, DM, HLD, HTN, and  tachy-brady syndrome s/p SJM single chamber PPM, now in persistent a-fib. LVEF 55%. She presented to the ED on 08/12/2016 with SOB and rapid Afib associated with SOB. She was diuresed and HR was controlled, discharged 11/21  Normal electrolytes and crea at the time. Results were still modestly rapid on telemetry review. She was at home and her family witnessed her lose consciousness with immediate CPR attempt, in VF upon EMS arrival, back to SR after to shocks, required epinephrine for hypotension.   Echocardiogram 11/24  20-25% severe LAE <<< 11/2  EF 55%   In a-fib with RVR on arrival to the hospital. Intubated, no longer sedated and  paralyzed. Started on Amiodarone and dilt for a-fib with RVR.  Also on levophed @ 18   Unresponsive to voice or pain  CURRENT MEDS . artificial tears  1 application Both Eyes QE9M . ceFEPime (MAXIPIME) IV  1 g Intravenous Q24H  . chlorhexidine gluconate (MEDLINE KIT)  15 mL Mouth Rinse BID  . famotidine (PEPCID) IV  20 mg Intravenous Q24H  . mouth rinse  15 mL Mouth Rinse 10 times per day  . sodium chloride  1,000 mL Intravenous Once   . sodium chloride    . amiodarone 30 mg/hr (08/25/16 2000)  . diltiazem (CARDIZEM) infusion 5 mg/hr (08/26/16 1015)  . insulin (NOVOLIN-R) infusion Stopped (08/25/16 0215)  . norepinephrine (LEVOPHED) Adult infusion 18 mcg/min (08/26/16 0815)   OBJECTIVE  Vitals:   08/26/16 0700 08/26/16 0800  08/26/16 0838 08/26/16 0849  BP:   (!) 138/48 (!) 117/42  Pulse:   84 76  Resp: '19 16 17   '$ Temp:  99.3 F (37.4 C)  99.1 F (37.3 C)  TempSrc:  Core (Comment)  Core (Comment)  SpO2: 100% 100% 100%   Weight:      Height:        Intake/Output Summary (Last 24 hours) at 08/26/16 1038 Last data filed at 08/26/16 0600  Gross per 24 hour  Intake           3172.2 ml  Output              220 ml  Net           2952.2 ml   Filed Weights   08/04/2016 2200 08/24/16 0000  Weight: 147 lb 7.8 oz (66.9 kg) 154 lb 5.2 oz (70 kg)   PHYSICAL EXAM  Intubated,  Unresponsive . HENT normal Neck supple  Clear Irregularly irregular rate and rhythm with controlled  ventricular response, no murmurs or gallops Abd-soft with active BS without hepatomegaly No Clubbing cyanosis edema Skin-warm and dry unrepsonsive  .  Accessory Clinical Findings  CBC  Recent Labs  08/18/2016 1949  08/25/16 0500 08/26/16 0500  WBC 14.3*  < > 18.4* 16.4*  NEUTROABS 9.2*  --   --   --   HGB 9.1*  < > 11.7* 10.8*  HCT 28.4*  < > 35.6* 33.9*  MCV 92.5  < > 87.9 90.6  PLT 217  < > 253 291  < > = values in this interval not displayed. Basic Metabolic Panel  Recent Labs  08/25/16 0500  08/25/16 1320 08/26/16 0500  NA 138  < > 135 134*  K 4.0  < > 4.7 5.8*  CL 108  < > 109 106  CO2 17*  < > 17* 18*  GLUCOSE 218*  < > 289* 264*  BUN 23*  < > 22* 29*  CREATININE 0.74  < > 0.80 1.30*  CALCIUM 9.0  < > 7.8* 8.8*  MG 1.8  --   --  2.3  PHOS 4.1  4.2  --   --  6.9*  < > = values in this interval not displayed. Liver Function Tests  Recent Labs  08/21/2016 1949 08/25/16 0500 08/26/16 0500  AST 72*  --   --   ALT 29  --   --   ALKPHOS 79  --   --   BILITOT 0.3  --   --   PROT 4.6*  --   --   ALBUMIN 2.4* 2.9* 2.7*   No results for input(s): LIPASE, AMYLASE in the last 72 hours. Cardiac Enzymes  Recent Labs  08/24/16 0545 08/24/16 1032 08/24/16 1632  TROPONINI 0.07* 0.05* 0.05*   Dg Chest 2  View  Result Date: 08/21/2016 CLINICAL DATA:  Shortness of breath starting at 8 p.m. and gradually worsening. History of atrial fibrillation and hypertension. EXAM: CHEST  2 VIEW COMPARISON:  07/14/2016 FINDINGS: Postoperative changes in the mediastinum. Cardiac pacemaker. Mild cardiac enlargement. Increasing interstitial pattern to the lungs mostly in the periphery and lung bases, likely representing developing edema. Small bilateral pleural effusions. No focal consolidation. No pneumothorax. Calcified and tortuous aorta. Degenerative changes in the spine and shoulders. IMPRESSION: Cardiac enlargement with increasing interstitial pattern to the lungs suggesting developing interstitial edema. Small bilateral pleural effusions. Electronically Signed   By: Burman Nieves M.D.   On: 08/21/2016 01:18   Dg Chest Port 1 View  Result Date: 08/26/2016 CLINICAL DATA:  Acute respiratory failure EXAM: PORTABLE CHEST 1 VIEW COMPARISON:  08/24/2016 FINDINGS: Cardiac shadow remains enlarged. Changes consistent with prior TAVR are noted. Endotracheal tube, nasogastric catheter and right jugular central line are again seen and stable. Pacing device is again noted and stable. Considerable right-sided subcutaneous emphysema is noted but improved from the prior study. Right chest tube is again seen and stable. Increasing density in the left lung base is noted consistent with evolving infiltrate. IMPRESSION: Increasing right basilar infiltrate. Tubes and lines as described.  No pneumothorax is noted. Electronically Signed   By: Alcide Clever M.D.   On: 08/26/2016 07:46   Dg Chest Port 1 View  Result Date: 08/24/2016 CLINICAL DATA:  82 y/o  F; encounter for central line placement. EXAM: PORTABLE CHEST 1 VIEW COMPARISON:  08/28/2016 chest radiograph. FINDINGS: Stable cardiac silhouette given projection and technique. Aortic valve replacement. Right central venous catheter tip projects over upper SVC. Right-sided chest tube  unchanged in position. Extensive subcutaneous emphysema over the right-sided chest wall. Single lead pacemaker. Additional electronic device projects over lower mediastinum. Transcutaneous pacing pads on left chest wall. Trace if any residual right-sided pneumothorax with accentuation of silhouettes. Endotracheal tube unchanged in position. Stable patchy airspace opacities. IMPRESSION: Right central venous catheter tip projects over upper SVC. Other lines and tubes are stable. Minimal if any right-sided pneumothorax. Stable patchy airspace opacities may represent edema, pneumonia, or  ARDS. Electronically Signed   By: Kristine Garbe M.D.   On: 08/24/2016 04:27   Dg Chest Portable 1 View  Result Date: 08/18/2016 CLINICAL DATA:  Chest tube placement.  Initial encounter. EXAM: PORTABLE CHEST 1 VIEW COMPARISON:  Chest radiograph performed earlier today at 8:40 p.m. FINDINGS: The patient's endotracheal tube is seen ending 4-5 cm above the carina. An enteric tube is noted extending below the diaphragm. A right-sided chest tube is noted. No significant pneumothorax is seen. Worsening bilateral airspace opacification may reflect pneumonia or pulmonary edema. ARDS is a concern. No definite pleural effusion is seen. The cardiomediastinal silhouette is borderline enlarged. A pacemaker is noted overlying the left chest wall, with a lead ending overlying the right ventricle. A vascular stent is noted overlying the heart. Prominent soft tissue air is noted bilaterally, significantly increased on the right and tracking into the neck. External pacing pads are noted. IMPRESSION: 1. Endotracheal tube seen ending 4-5 cm above the carina. 2. Right-sided chest tubes noted, without definite evidence of pneumothorax. 3. Worsening bilateral airspace opacification may reflect pneumonia or pulmonary edema. ARDS is a concern. 4. Borderline cardiomegaly. 5. Significantly worsened right-sided soft tissue air, tracking into the  neck, status post placement of right-sided chest tube. Electronically Signed   By: Garald Balding M.D.   On: 08/14/2016 23:43   Dg Chest Portable 1 View  Result Date: 08/21/2016 CLINICAL DATA:  Repositioning of ET tube EXAM: PORTABLE CHEST 1 VIEW COMPARISON:  08/10/2016 FINDINGS: Interim repositioning of endotracheal tube, the tip is now 4.1 cm superior to the carina. Single lead left-sided pacing device remains in place. Valvular prosthesis again noted. Esophageal tube tip extends below the diaphragm. There is stable cardiomegaly. Hazy bilateral pulmonary infiltrates or edema, do not appear significantly changed. Interval increase in size of right-sided pneumothorax, demonstrating 2.2 cm of pleural-parenchymal separation at the apex. Pneumothorax is seen laterally as well as along the right cardiophrenic sulcus. Increased subcutaneous emphysema over the right lateral chest wall. IMPRESSION: 1. Support lines and tubes as described above. 2. No significant interval change in cardiomegaly and bilateral interstitial and alveolar infiltrates or edema, right greater than left. 3. Slight interval increase in size of small moderate right-sided pneumothorax. Electronically Signed   By: Donavan Foil M.D.   On: 08/09/2016 21:00   Dg Chest Portable 1 View  Result Date: 08/31/2016 CLINICAL DATA:  Post intubation in CPR EXAM: PORTABLE CHEST 1 VIEW COMPARISON:  08/22/2016 FINDINGS: Endotracheal to appears high above the thoracic inlet approximately 13 cm from the carina. Recommend advancing 6 cm. NG tube in the stomach. Moderate volume RIGHT pneumothorax measures 11 mm from chest wall and occupies approximately 10-15% of lung volume. IMPRESSION: 1. Moderate volume RIGHT pneumothorax. 2. Endotracheal tube is high.  Recommend advancement by 6 cm. Critical Value/emergent results were called by telephone at the time of interpretation on 08/03/2016 at 8:10 pm to Dr. Gareth Morgan , who verbally acknowledged these results.  Electronically Signed   By: Suzy Bouchard M.D.   On: 08/30/2016 20:11   Dg Chest Port 1 View  Result Date: 08/22/2016 CLINICAL DATA:  Follow-up pleural effusions.  Subsequent encounter. EXAM: PORTABLE CHEST 1 VIEW COMPARISON:  Chest radiograph performed 08/21/2016 FINDINGS: A small left pleural effusion is again noted, similar in appearance to the prior study. The right-sided pleural effusion appears to have largely resolved. Vascular congestion is noted. Increased interstitial markings may reflect mild interstitial edema or possibly pneumonia. No pneumothorax is seen. The cardiomediastinal silhouette is borderline  normal in size. A stent is noted overlying the heart. A pacemaker is noted overlying the left chest wall, with a lead ending overlying the right ventricle. No acute osseous abnormalities are seen. Degenerative change is noted at the right glenohumeral joint. IMPRESSION: Small left pleural effusion, similar in appearance to the prior study. Right-sided pleural effusion appears to have largely resolved. Vascular congestion noted. Increased interstitial markings may reflect mild interstitial edema or possibly pneumonia. Electronically Signed   By: Roanna Raider M.D.   On: 08/22/2016 03:34  Telemetry Personally reviewed  heart rates are much better now averaging in the 75-85 range  LHC: 06/14/2016 1. Widely patent coronary arteries with no angiographic disease in the LAD, left main, or right coronary artery and minimal irregularity in the left circumflex 2. Calcified aortic valve with restricted leaflet motion and severe aortic stenosis with a mean gradient of 35 mmHg 3. Normal LV systolic function with mild mitral regurgitation by ventriculography 4. Normal aortic root without evidence of aneurysm, estimated angle for valve deployment LAO 5, caudal 5 5. Aborted right heart catheterization (estimated PASP by echo 37 mmHg)  TTE: 08/03/2016 Left ventricle: The cavity size was normal. Wall  thickness was   normal. The estimated ejection fraction was 55%. Wall motion was   normal; there were no regional wall motion abnormalities.   Indeterminant diastolic function (atrial fibrillation). - Aortic valve: There is a bioprosthetic aortic valve s/p TAVR.   Mild peri-valvular regurgitation. No significant stenosis. Mean   gradient (S): 11 mm Hg. - Mitral valve: Moderately calcified annulus. Moderately calcified   leaflets . There was mild regurgitation. - Left atrium: The atrium was moderately dilated. - Right ventricle: The cavity size was normal. Pacer wire or   catheter noted in right ventricle. Systolic function was normal. - Right atrium: The atrium was moderately dilated. - Tricuspid valve: Peak RV-RA gradient (S): 35 mm Hg. - Pulmonary arteries: PA peak pressure: 43 mm Hg (S). - Systemic veins: IVC measured 2.0 cm with < 50% respirophasic   variation, suggestiing RA pressure 8 mmHg.  Impressions: - The patient was in atrial fibrillation. Normal LV size with EF   55%. Normal RV size and systolic function. Moderate biatrial   enlargement. Bioprosthetic aortic valve s/p TAVR. Mild   peri-valvular aortic insufficiency. Mild mitral regurgitation.   Mild pulmonary hypertension.    ASSESSMENT AND PLAN  Principal Problem:   Cardiac arrest [facility]) Active Problems:   S/P TAVR (transcatheter aortic valve replacement)   Current use of long term anticoagulation   Atrial fibrillation with RVR (HCC)   Acute respiratory failure with hypoxia (HCC)   Pneumothorax, right   Cardiogenic shock (HCC)    1. VF arrest - I do not understand the mechanism of her cardiac arrest. she had clean coronaries in 06/2016, s/p TAVR with normally functioning valve and normal LVEF, normal electrolytes including K, Na, Mg.   Right now however remained in the middle of the [place]     2. Atrial fibrillation with RVR:   3. Acute on chronic diastolicand systolic  CHF  5. Hyperkalemia -  Kayelxalate given  6. Rt pneumothorax- chest tube placed  7. Shock- on IV pressors  8.  Acidemia  She remains critically ill. She remains on pressors amiodarone With heart rate controlled, I will discontinue the diltiazem is negative potential inotropic. We can resume it necessary. Probably prefer digoxin with LV dysfunction. We'll begin   beta blockers in a.m. given cardiomyopathy. Seh wil also  need ACE inhibitors  Neurological status remains devastating. I'm also concerned about increasing levels of potassium the context of ongoing pressors and persistent acidemia.  She certainly is at risk for infarction  Prognosis is grim. Reviewed this with the family. Discussed with Dr. Creig Hines.  Signed, Virl Axe, MD 08/26/2016

## 2016-08-26 NOTE — Progress Notes (Signed)
Patient transported to CT and back to room 4N23 without any apparent complications. RT will continue to monitor.

## 2016-08-26 NOTE — Progress Notes (Addendum)
PULMONARY / CRITICAL CARE MEDICINE   Name: [name] MRN: [id number] DOB: 11-09-1926    ADMISSION DATE:  08/24/2016 CONSULTATION DATE:  08/06/2016  REFERRING MD: Dr. Dalene Seltzer  CHIEF COMPLAINT: Cardiac arrest  HISTORY OF PRESENT ILLNESS:  82 y.o. female with extensive cardiac history with recent admission for TAVR, which was without complication and she recovered well from. She has been admitted twice since then with syncope and then pulmonary edema secondary to AF RVR. She is on coumadin. She was just discharged 11/22 from her AFRVR admission and was doing well, however, later that day while sitting at home and talking on the phone she became unresponsive. Witnessed by nephew who started CPR after calling EMS. 10 mins later EMS arrived and noted her to be in VF. She was defibrillated and given several rounds of epinephrine. Down-time estimated at about 30 mins. In ED she was intubated and found to have R sided pneumothorax. PCCM asked to admit.  SUBJECTIVE: Patient re-warmed on 11/24. Still not having purposeful movements.   REVIEW OF SYSTEMS:  Unable to obtain with patient intubated & with altered mentation.  VITAL SIGNS: BP (!) 138/48   Pulse 84   Temp 99.3 F (37.4 C) (Core (Comment))   Resp 17   Ht 5\' 6"  (1.676 m)   Wt 154 lb 5.2 oz (70 kg)   SpO2 100%   BMI 24.91 kg/m   HEMODYNAMICS: CVP:  [9 mmHg-14 mmHg] 12 mmHg  VENTILATOR SETTINGS: Vent Mode: PRVC FiO2 (%):  [40 %-60 %] 50 % Set Rate:  [16 bmp] 16 bmp Vt Set:  [500 mL] 500 mL PEEP:  [5 cmH20] 5 cmH20 Plateau Pressure:  [19 cmH20-26 cmH20] 22 cmH20  INTAKE / OUTPUT: I/O last 3 completed shifts: In: 4888.6 [I.V.:4378.6; NG/GT:60; IV Piggyback:450] Out: 1100 [Urine:1040; Chest Tube:60]  PHYSICAL EXAMINATION: General:  Elderly female. No family at bedside. Eyes open with upward gaze. Neuro:  Not following commands. Babinski in bilateral lower extremities w/ triple flexion. No withdrawal to pain in  extremities. Not attending to voice or following commands. HEENT:  Endotracheal tube in place. No scleral icterus or injection. Cardiovascular:  Regular rate with irregular rhythm. Atrial fibrillation on telemetry. No edema. Unable to appreciate JVD.  Pulmonary: Symmetric chest wall rise on ventilator. Coarse breath sounds bilaterally.  Abdomen:  Soft. Nondistended. Integument: Cool. Mottling noted in lower extremities with bluish toes.   LABS:  BMET  Recent Labs Lab 08/25/16 0930 08/25/16 1320 08/26/16 0500  NA 138 135 134*  K 4.4 4.7 5.8*  CL 109 109 106  CO2 18* 17* 18*  BUN 23* 22* 29*  CREATININE 0.84 0.80 1.30*  GLUCOSE 261* 289* 264*    Electrolytes  Recent Labs Lab 08/24/16 0545  08/25/16 0500 08/25/16 0930 08/25/16 1320 08/26/16 0500  CALCIUM 9.4  < > 9.0 8.4* 7.8* 8.8*  MG 1.9  --  1.8  --   --  2.3  PHOS 4.4  --  4.1  4.2  --   --  6.9*  < > = values in this interval not displayed.  CBC  Recent Labs Lab 08/24/16 0545  08/24/16 1637 08/25/16 0500 08/26/16 0500  WBC 19.6*  --   --  18.4* 16.4*  HGB 10.9*  < > 11.6* 11.7* 10.8*  HCT 33.6*  < > 34.0* 35.6* 33.9*  PLT 298  --   --  253 291  < > = values in this interval not displayed.  Coag's  Recent Labs Lab  08/26/2016 2344  08/24/16 0630 08/25/16 0500 08/26/16 0500  APTT 55*  --  43*  --   --   INR 4.31*  < > 2.20 3.14 3.66  < > = values in this interval not displayed.  Sepsis Markers  Recent Labs Lab 08/25/2016 1952 08/04/2016 2251  LATICACIDVEN 5.71* 4.48*    ABG  Recent Labs Lab 08/22/2016 2007 08/24/16 0223  PHART 7.276* 7.349*  PCO2ART 43.3 36.7  PO2ART 148.0* 94.0    Liver Enzymes  Recent Labs Lab 08/21/16 0650 08/16/2016 1949 08/25/16 0500 08/26/16 0500  AST 31 72*  --   --   ALT 16 29  --   --   ALKPHOS 81 79  --   --   BILITOT 1.1 0.3  --   --   ALBUMIN 3.9 2.4* 2.9* 2.7*    Cardiac Enzymes  Recent Labs Lab 08/24/16 0545 08/24/16 1032 08/24/16 1632   TROPONINI 0.07* 0.05* 0.05*    Glucose  Recent Labs Lab 08/25/16 0859 08/25/16 1258 08/25/16 1612 08/25/16 2020 08/26/16 0014 08/26/16 0511  GLUCAP 224* 290* 253* 226* 240* 251*    Imaging Dg Chest Port 1 View  Result Date: 08/26/2016 CLINICAL DATA:  Acute respiratory failure EXAM: PORTABLE CHEST 1 VIEW COMPARISON:  08/24/2016 FINDINGS: Cardiac shadow remains enlarged. Changes consistent with prior TAVR are noted. Endotracheal tube, nasogastric catheter and right jugular central line are again seen and stable. Pacing device is again noted and stable. Considerable right-sided subcutaneous emphysema is noted but improved from the prior study. Right chest tube is again seen and stable. Increasing density in the left lung base is noted consistent with evolving infiltrate. IMPRESSION: Increasing right basilar infiltrate. Tubes and lines as described.  No pneumothorax is noted. Electronically Signed   By: Alcide CleverMark  Lukens M.D.   On: 08/26/2016 07:46     STUDIES:  EEG 11/24:  Near complete suppression of all cortical brain activity. The findings consistent with severe diffuse global brain injury as can be seen from hypoxic-ischemic injury. LIMITED ECHO 11/24:  LV normal in size w/ EF 20-25%. Severe diffuse hypokinesis. Profound systolic dyssynchrony. RV normal in size w/ moderate reduction in systolic function. LA severely dilated. S/P TAVR w/ mild perivalvular regurg. Mild to moderate mitral regurg.   MICROBIOLOGY: MRSA PCR 11/20:  Negative MRSA PCR 11/23:  Negative Blood Ctx x2 11/22 >>  ANTIBIOTICS: Cefepime 11/22 >> Vancomycin 11/22 >>  SIGNIFICANT EVENTS: 11/22 - discharge, subsequent cardiac arrest  LINES/TUBES: OETT 7.0 11/22 >> R Chest Tube 11/22 >> R IJ CVL 11/23 >> L Radial Art Line 11/23 >> Foley 11/22 >> OGT 11/22 >> PIV  ASSESSMENT / PLAN:  NEUROLOGIC A:   Acute Encephalopathy - Likely anoxic. S/P therapeutic hypothermia. Rewarmed 11/24. Sedation on  Ventilator  P:   RASS goal: 0 to -1 D/C Propofol, Fentanyl, & Nimbex drips Versed IV prn Sedation Fentanyl IV prn Pain Plan for Head CT w/o today Consider Neurology Consult depending on result  PULMONARY A: Acute Hypoxic Respiratory Failure - Secondary to cardiac arrest. Right Sided Pneumothorax - S/P Chest tube.  P:   Full vent support Intermittent ABG & Portable CXR Continuing Chest Tube to Suction SBT & PS Wean as/if mental status improves ABG in AM 11/26  CARDIOVASCULAR A:  Shock - Likely cardiogenic. Slow improvement. S/P Cardiac Arrest - Ventricular fibrillation. Acute on Chronic Systolic CHF Atrial Fibrillation with RVR - Repeated admissions. Now on amiodarone, had some bradycardia but unclear that related to the medication.  S/P TAVR 07/2016 S/P Pacemaker H/O Aortic Stenosis, HTN, & HLD  P:  Continuous Telemetry Monitoring Vitals per unit protocol Levophed drip for Goal MAP >80 Diltiazem gtt Amiodarone gtt Cardiology Following- Appreciate recommendations Holding on further diuresis with worsening renal function  RENAL A:   Acute Renal Failure - Oliguric.  Hyperkalemia - Treated. Hypokalemia - Resolved. Oliguria   P:   Trending UOP with Foley Stopping Diuresis Monitoring electrolytes & renal function daily as well as per protocol Replacing electrolytes as indicated Kayexalate 15gm VT x1 BMP @ 1700 today  GASTROINTESTINAL A:   No acute issues  P:   NPO Pepcid IV q12hr Holding on tube feedings  HEMATOLOGIC A:   Coagulopathy - Secondary to Warfarin. Anemia - No signs of active bleeding. Mild.   P:  Trending cell counts w/ CBC Trending INR daily Holding Warfarin Heparin gtt per pharmacy protocol  INFECTIOUS A:   No evidence of acute infection.  P:   Empiric Vancomycin & Cefepime Day #4 Awaiting culture results Trending procalcitonin per algorithm   ENDOCRINE A:   H/O DM    P:   D/C SSI & Lantus Restarting Insulin  gtt Accu-Checks q1hr  FAMILY  - Updates: HCPOA [facility] updated via phone by Dr. Jamison NeighborNestor 11/23. No family at bedside 11/25.   - Inter-disciplinary family meet or Palliative Care meeting due by:  11/29   TODAY'S SUMMARY:  82 y.o. female with cardiac history just discharged 11/22 presents same day after cardiac arrest 30 mins VF. R sided PTX CT placed in ED. Suspect significant anoxic brain injury. Checking CT head w/o today. Restarting insulin drip given hyperglycemia & hyperkalemia.   I have spent a total of 32 minutes caring for the patient and reviewing the patient's electronic medical record.   Donna ChristenJennings E. Jamison NeighborNestor, M.D. [facility] Pager:  [phone] After 3pm or if no response, call [phone] 08/26/2016, 8:50 AM

## 2016-08-26 NOTE — Progress Notes (Signed)
Patient HR increasing to 140s but not sustaining. Pola CornELink, MD called and notified. Orders to continue to titrate Levophed down for Map of 65. Will continue to monitor patient.

## 2016-08-26 NOTE — Progress Notes (Signed)
Called E-Link and notified MD of K+ level of 5.8.  Will continue to monitor patient and lab trends.  Tashawna Thom E Mylinda LatinaNino Combs, [place]

## 2016-08-26 NOTE — Progress Notes (Signed)
Pharmacy Antibiotic Note  [name] is a 82 y.o. female admitted on 2016-03-24 with pneumonia.  Pharmacy has been consulted for cefepime dosing. Patient is s/p therapeutic hypothermia. She has developed a bump in her creatinine 0.80>1.30. Estimated CrCl ~30 ml/min. Discussed with Dr. Christene Slatese Dios and will discontinue vancomycin at this time. Patient is borderline for cefepime dose adjustment, will continue to monitor the trend and adjust if needed.  Plan: Continue Cefepime 1g IV q24h Monitor C&S, CBC and clinical progression  Height: 5\' 6"  (167.6 cm) Weight: 154 lb 5.2 oz (70 kg) IBW/kg (Calculated) : 59.3  Temp (24hrs), Avg:97.9 F (36.6 C), Min:95.3 F (35.2 C), Max:100 F (37.8 C)   Recent Labs Lab 08/22/16 0238  March 12, 2016 1949  March 12, 2016 1952 March 12, 2016 2251  08/24/16 0545  08/25/16 0020 08/25/16 0500 08/25/16 0930 08/25/16 1320 08/26/16 0500  WBC 10.2  --  14.3*  --   --   --   --  19.6*  --   --  18.4*  --   --  16.4*  CREATININE 0.68  < > 0.82  < >  --   --   < > 0.95  < > 0.68 0.74 0.84 0.80 1.30*  LATICACIDVEN  --   --   --   --  5.71* 4.48*  --   --   --   --   --   --   --   --   < > = values in this interval not displayed.  Estimated Creatinine Clearance: 28.5 mL/min (by C-G formula based on SCr of 1.3 mg/dL (H)).    Allergies  Allergen Reactions  . Aspirin Other (See Comments)    Gi bleeding  . Codeine Other (See Comments)    GI Lead  . Tramadol Nausea And Vomiting  . Lipitor [Atorvastatin Calcium] Other (See Comments)    Muscle Ache  . Pravachol Other (See Comments)    Muscle Ache    Antimicrobials this admission:  Vancomycin 11/23 >> 11/25 Cefepime 11/23 >>  Microbiology results:  11/23 MRSA PCR - neg 11/23 BCx - ngtd  Thank you for allowing pharmacy to be a part of this patient's care.  Casilda Carlsaylor Merric Yost, PharmD. PGY-2 Infectious Diseases Pharmacy Resident Pager: [phone] 08/26/2016 8:24 AM

## 2016-08-26 NOTE — Plan of Care (Signed)
  Interdisciplinary Goals of Care Family Meeting   Date carried out:: 08/26/2016  Location of the meeting: Bedside  Member's involved: Physician, Bedside Registered Nurse, Family Member or next of kin and Other: Cardiologist  Durable Power of Attorney or acting medical decision maker: [name]  Discussion: We discussed goals of care for [name] .  Lengthy discussion with patient's family at bedside as well as with her of care power of attorney. Explained that we may not ever know the exact cause for her cardiac arrest. Also informed them that I am concerned about significant anoxic brain injury. Relayed the results of EEG. Explained we will be obtaining a CT of the head without contrast today and probable consultation by neurology.  Code status: Full Code  Disposition: Continue current acute care  Time spent for the meeting: 5 minutes  Lawanda CousinsJennings Elzora Cullins 08/26/2016, 12:09 PM

## 2016-08-26 NOTE — Progress Notes (Signed)
Normothermia therapy stopped at ~1655 to take pt down for heat CT. Per Dr. Jamison NeighborNestor w/ CCM, can pause therapy. Water purged. Pt temp 37.1, H2O temp 23.6. Will resume therapy once pt returns to room.

## 2016-08-27 DIAGNOSIS — S270XXA Traumatic pneumothorax, initial encounter: Secondary | ICD-10-CM

## 2016-08-27 DIAGNOSIS — Z7189 Other specified counseling: Secondary | ICD-10-CM

## 2016-08-27 DIAGNOSIS — G9340 Encephalopathy, unspecified: Secondary | ICD-10-CM

## 2016-08-27 LAB — GLUCOSE, CAPILLARY
GLUCOSE-CAPILLARY: 101 mg/dL — AB (ref 65–99)
GLUCOSE-CAPILLARY: 118 mg/dL — AB (ref 65–99)
GLUCOSE-CAPILLARY: 152 mg/dL — AB (ref 65–99)
GLUCOSE-CAPILLARY: 157 mg/dL — AB (ref 65–99)
GLUCOSE-CAPILLARY: 160 mg/dL — AB (ref 65–99)
GLUCOSE-CAPILLARY: 180 mg/dL — AB (ref 65–99)

## 2016-08-27 LAB — PROTIME-INR
INR: 3.73
Prothrombin Time: 37.9 seconds — ABNORMAL HIGH (ref 11.4–15.2)

## 2016-08-27 LAB — RENAL FUNCTION PANEL
ALBUMIN: 2.5 g/dL — AB (ref 3.5–5.0)
Anion gap: 10 (ref 5–15)
BUN: 38 mg/dL — AB (ref 6–20)
CO2: 19 mmol/L — AB (ref 22–32)
Calcium: 8.6 mg/dL — ABNORMAL LOW (ref 8.9–10.3)
Chloride: 106 mmol/L (ref 101–111)
Creatinine, Ser: 1.39 mg/dL — ABNORMAL HIGH (ref 0.44–1.00)
GFR calc Af Amer: 38 mL/min — ABNORMAL LOW (ref >60)
GFR calc non Af Amer: 33 mL/min — ABNORMAL LOW (ref >60)
GLUCOSE: 183 mg/dL — AB (ref 65–99)
PHOSPHORUS: 5.2 mg/dL — AB (ref 2.5–4.6)
POTASSIUM: 4.5 mmol/L (ref 3.5–5.1)
Sodium: 135 mmol/L (ref 135–145)

## 2016-08-27 LAB — BLOOD GAS, ARTERIAL
ACID-BASE DEFICIT: 5.5 mmol/L — AB (ref 0.0–2.0)
Bicarbonate: 18.7 mmol/L — ABNORMAL LOW (ref 20.0–28.0)
DRAWN BY: 441351
FIO2: 40
MECHVT: 500 mL
O2 SAT: 98.7 %
PATIENT TEMPERATURE: 98.6
PCO2 ART: 32.8 mmHg (ref 32.0–48.0)
PEEP/CPAP: 5 cmH2O
PH ART: 7.375 (ref 7.350–7.450)
RATE: 16 resp/min
pO2, Arterial: 126 mmHg — ABNORMAL HIGH (ref 83.0–108.0)

## 2016-08-27 LAB — CBC WITH DIFFERENTIAL/PLATELET
Basophils Absolute: 0 10*3/uL (ref 0.0–0.1)
Basophils Relative: 0 %
EOS PCT: 0 %
Eosinophils Absolute: 0 10*3/uL (ref 0.0–0.7)
HEMATOCRIT: 29.5 % — AB (ref 36.0–46.0)
HEMOGLOBIN: 9.6 g/dL — AB (ref 12.0–15.0)
LYMPHS ABS: 0.5 10*3/uL — AB (ref 0.7–4.0)
LYMPHS PCT: 5 %
MCH: 28.6 pg (ref 26.0–34.0)
MCHC: 32.5 g/dL (ref 30.0–36.0)
MCV: 87.8 fL (ref 78.0–100.0)
Monocytes Absolute: 0.7 10*3/uL (ref 0.1–1.0)
Monocytes Relative: 7 %
NEUTROS ABS: 9.4 10*3/uL — AB (ref 1.7–7.7)
NEUTROS PCT: 88 %
Platelets: 196 10*3/uL (ref 150–400)
RBC: 3.36 MIL/uL — AB (ref 3.87–5.11)
RDW: 15.1 % (ref 11.5–15.5)
WBC: 10.6 10*3/uL — AB (ref 4.0–10.5)

## 2016-08-27 LAB — MAGNESIUM: Magnesium: 2.1 mg/dL (ref 1.7–2.4)

## 2016-08-27 MED ORDER — SODIUM CHLORIDE 0.9 % IV SOLN
10.0000 mg/h | INTRAVENOUS | Status: DC
  Administered 2016-08-27: 10 mg/h via INTRAVENOUS
  Filled 2016-08-27: qty 10

## 2016-08-27 MED ORDER — METOPROLOL TARTRATE 5 MG/5ML IV SOLN
5.0000 mg | Freq: Once | INTRAVENOUS | Status: AC
  Administered 2016-08-27: 5 mg via INTRAVENOUS
  Filled 2016-08-27: qty 5

## 2016-08-27 MED ORDER — FENTANYL 2500MCG IN NS 250ML (10MCG/ML) PREMIX INFUSION
100.0000 ug/h | INTRAVENOUS | Status: DC
  Administered 2016-08-27: 100 ug/h via INTRAVENOUS
  Filled 2016-08-27: qty 250

## 2016-08-27 MED ORDER — FENTANYL BOLUS VIA INFUSION
50.0000 ug | INTRAVENOUS | Status: DC | PRN
  Administered 2016-08-27 (×2): 200 ug via INTRAVENOUS
  Filled 2016-08-27: qty 200

## 2016-08-27 MED ORDER — MIDAZOLAM BOLUS VIA INFUSION (WITHDRAWAL LIFE SUSTAINING TX)
5.0000 mg | INTRAVENOUS | Status: DC | PRN
  Administered 2016-08-27 (×2): 20 mg via INTRAVENOUS
  Filled 2016-08-27: qty 20

## 2016-08-29 LAB — CULTURE, BLOOD (ROUTINE X 2)
CULTURE: NO GROWTH
Culture: NO GROWTH

## 2016-08-29 NOTE — Telephone Encounter (Signed)
According to chart patient has deceased

## 2016-08-30 ENCOUNTER — Telehealth: Payer: Self-pay

## 2016-08-30 NOTE — Telephone Encounter (Signed)
On 08/30/2016 I received a death certificate from [facility] [employer] [employer] (original). The death certificate is for burial. The patient is a patient of Doctor Dios.  The death certificate will be taken to Pulmonary Unit @ [facility] this am for signature.  09/01/2016 I received the death certificate back from Doctor Dios. I got the death certificate ready and called the funeral home to let them know the death certificate is ready for pickup.

## 2016-09-01 NOTE — Discharge Summary (Signed)
PULMONARY / CRITICAL CARE MEDICINE   Name: [name] MRN: [id number] DOB: 1926/10/16    ADMISSION DATE:  08/09/2016 CONSULTATION DATE:  08/17/2016  REFERRING MD: Dr. Dalene SeltzerSchlossman  CHIEF COMPLAINT: Cardiac arrest  HISTORY OF PRESENT ILLNESS:   82 year old female with extensive cardiac history with recent admission for TAVR, which was without complication and she recovered well from. She has been admitted twice since then with syncope and then pulmonary edema secondary to AF RVR. She is on coumadin. She was just discharged 11/22 from her AFRVR admission and was doing well, however, later that day while sitting at home and talking on the phone she became unresponsive. Witnessed by nephew who started CPR after calling EMS. 10 mins later EMS arrived and noted her to be in VF. She was defibrillated and given several rounds of epinephrine. Down-time estimated at about 30 mins. In ED she was intubated and found to have R sided pneumothorax. PCCM asked to admit.  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  PAST SURGICAL HISTORY: She  has a past surgical history that includes Shoulder surgery (12/07/2004); Pacemaker insertion (06/18/2008); Breast biopsy (02/22/2007 ); Cardioversion (05/16/2005); Esophagogastroduodenoscopy (03/12/2007 ); Cardiac catheterization (N/A, 06/14/2016); Abdominal hysterectomy; Appendectomy; Transcatheter aortic valve replacement, transfemoral (N/A, 07/04/2016); and TEE without cardioversion (N/A,  07/04/2016).  Allergies  Allergen Reactions  . Aspirin Other (See Comments)    Gi bleeding  . Codeine Other (See Comments)    GI Lead  . Tramadol Nausea And Vomiting  . Lipitor [Atorvastatin Calcium] Other (See Comments)    Muscle Ache  . Pravachol Other (See Comments)    Muscle Ache    No current facility-administered medications on file prior to encounter.    Current Outpatient Prescriptions on File Prior to Encounter  Medication Sig  . amoxicillin (AMOXIL) 500 MG capsule Take 500 mg by mouth daily as needed (for dental appointments).   . calcium carbonate (OS-CAL) 600 MG TABS Take 1,200 mg by mouth 3 (three) times a week.   . digoxin (LANOXIN) 0.125 MG tablet Take 1 tablet (0.125 mg total) by mouth daily.  Marland Kitchen. diltiazem (CARDIZEM CD) 240 MG 24 hr capsule Take 1 capsule (240 mg total) by mouth daily.  . furosemide (LASIX) 40 MG tablet Begin taking 40mg  daily (Patient taking differently: Take 40 mg by mouth daily. )  . gabapentin (NEURONTIN) 300 MG capsule Take 300-900 mg by mouth See admin instructions. Take 300 mg by mouth in the morning and take 900 mg by mouth in the evening.  Marland Kitchen. levothyroxine (SYNTHROID, LEVOTHROID) 50 MCG tablet Take 50 mcg by mouth daily.    Marland Kitchen. lisinopril (PRINIVIL,ZESTRIL) 2.5 MG tablet Take 2.5 mg by mouth daily.  . metFORMIN (GLUCOPHAGE) 500 MG tablet Take 1,000 mg by mouth 2 (two) times daily.   . metoprolol (LOPRESSOR) 100 MG tablet TAKE ONE TABLET BY MOUTH TWICE DAILY  . Multiple Vitamins-Minerals (VISION-VITE PRESERVE PO) Take 1 capsule by mouth 2 (two) times daily.  . potassium chloride SA (K-DUR,KLOR-CON) 20 MEQ tablet Take 1 tablet (20 mEq total) by mouth 2 (two) times daily.  . psyllium (METAMUCIL) 58.6 % powder Take 1 packet by mouth daily.  . simvastatin (  ZOCOR) 10 MG tablet Take 1 tablet (10 mg total) by mouth daily.  Marland Kitchen warfarin (COUMADIN) 2.5 MG tablet Take 0.5 to 1 tablet daily as directed by Coumadin clinic. (Patient taking differently: Take  1.25-2.5 mg by mouth See admin instructions. Take 1/2 tablet on Wednesday then take 1 tablet all there other days)    FAMILY HISTORY:  Her indicated that her mother is deceased. She indicated that her father is deceased. She indicated that three of her seven sisters are deceased. She indicated that the status of her neg hx is unknown.    SOCIAL HISTORY: She  reports that she quit smoking about 55 years ago. Her smoking use included Cigarettes. She has a 10.00 pack-year smoking history. She has never used smokeless tobacco. She reports that she does not drink alcohol or use drugs.  REVIEW OF SYSTEMS:   unable  SUBJECTIVE:  unable  VITAL SIGNS: BP (!) 155/60   Pulse (!) 119   Temp 98.8 F (37.1 C)   Resp 19   Ht 5\' 6"  (1.676 m)   Wt 157 lb 1.6 oz (71.3 kg)   SpO2 100%   BMI 25.36 kg/m   HEMODYNAMICS:    VENTILATOR SETTINGS:    INTAKE / OUTPUT: I/O last 3 completed shifts: In: 939 [I.V.:689; NG/GT:150; IV Piggyback:100] Out: 480 [Urine:445; Chest Tube:35]  PHYSICAL EXAMINATION: General:  Frail elderly female on vent Neuro:  Comatose, no pain response HEENT:  [place]/AT, PERRL, no JVD Cardiovascular:  RRR, no MRG Lungs:  Diminished R, clear otherwise, Crepitus R anterior chest Abdomen:  Soft, non-distended Musculoskeletal:  No acute deformity Skin:  Grossly intact, pallor.   LABS:  BMET  Recent Labs Lab 08/26/16 0500 08/26/16 1705 09/22/2016 0435  NA 134* 134* 135  K 5.8* 4.8 4.5  CL 106 106 106  CO2 18* 18* 19*  BUN 29* 35* 38*  CREATININE 1.30* 1.47* 1.39*  GLUCOSE 264* 145* 183*    Electrolytes  Recent Labs Lab 08/25/16 0500  08/26/16 0500 08/26/16 1705 09-22-16 0435  CALCIUM 9.0  < > 8.8* 8.6* 8.6*  MG 1.8  --  2.3  --  2.1  PHOS 4.1  4.2  --  6.9*  --  5.2*  < > = values in this interval not displayed.  CBC  Recent Labs Lab 08/25/16 0500 08/26/16 0500 Sep 22, 2016 0435  WBC 18.4* 16.4* 10.6*  HGB 11.7* 10.8* 9.6*  HCT 35.6* 33.9* 29.5*   PLT 253 291 196    Coag's  Recent Labs Lab 08/24/16 0630 08/25/16 0500 08/26/16 0500 09/22/16 0435  APTT 43*  --   --   --   INR 2.20 3.14 3.66 3.73    Sepsis Markers  Recent Labs Lab 08/09/2016 1952 08/04/2016 2251  LATICACIDVEN 5.71* 4.48*    ABG  Recent Labs Lab 08/18/2016 2007 08/24/16 0223 September 22, 2016 0416  PHART 7.276* 7.349* 7.375  PCO2ART 43.3 36.7 32.8  PO2ART 148.0* 94.0 126*    Liver Enzymes  Recent Labs Lab 08/22/2016 1949 08/25/16 0500 08/26/16 0500 22-Sep-2016 0435  AST 72*  --   --   --   ALT 29  --   --   --   ALKPHOS 79  --   --   --   BILITOT 0.3  --   --   --   ALBUMIN 2.4* 2.9* 2.7* 2.5*    Cardiac Enzymes  Recent Labs Lab 08/24/16 0545 08/24/16 1032 08/24/16 1632  TROPONINI 0.07* 0.05* 0.05*    Glucose  Recent Labs Lab 08/26/16 2202 08/26/16 2301 2016/09/20 0003 Sep 20, 2016 0329 09-20-16 0845 Sep 20, 2016 1206  GLUCAP 157* 160* 152* 180* 118* 101*    Imaging No results found.   STUDIES:  CT head 11/22 > EEG 11/23 > CXR admit 11/22 > R PTX  CULTURES: BCx2 11/22 > Urine 11/22 >  ANTIBIOTICS: Cefepime 11/22 > Vancomycin 11/22 >  SIGNIFICANT EVENTS: 11/22 discharge, subsequent cardiac arrest  LINES/TUBES: ETT 11/22 > R chest tube 11/22 > CVL 11/22 >  DISCUSSION: 82 year old female with cardiac history just discharged 11/22 presents same day after cardiac arrest 30 mins VF. R sided PTX CT placed in ED. Family wishing to be aggressive and will admit with TTM protocol on vent. Full code.   ASSESSMENT / PLAN:  PULMONARY A: Inability to protect airway secondary to cardiac arrest R sided small-mod PTX  P:   Full vent support Follow ABG CXR in AM ED to place chest tube CT to 20 cmH20 suction  CARDIOVASCULAR A:  Cardiac arrest Acute on chronic systolic CHF Atrial fibrillation with RVR S/p TAVR 07/2016 H/o AS, pacemaker, HTN, HLD  P:  Telemetry monitoring Ensure lactic clearing PRN levophed to keep MAP >  80 TTM protocol 33C Place CVL, Art line, Obtain CVP Cardiology following > no indication for cath or amiodarone at this time.  RENAL A:   No acute issues  P:   BMP every 2 hours  GASTROINTESTINAL A:   No acute issues  P:   Pepcid for SUP  HEMATOLOGIC A:   Anemia Warfarin coagulopathy  P:  Follow CBC, INR Hold warfarin, heparin per pharmacy  INFECTIOUS A:   Leukocytosis, potentially aspiration PNA vs HCAP (suspect this is edema on CXR)  P:   ABX as above PCT to help taper abx rapidly if able Follow cultures  ENDOCRINE A:   DM   P:   CBG monitoring and SSI  NEUROLOGIC A:   Acute anoxic encephalopathy P:   RASS goal: -4 Fentanyl and propofol Nimbex to prevent shivering. EGG  FAMILY  - Updates: family updated at length by PH/RA in ED. HOA driving from [place]  - Inter-disciplinary family meet or Palliative Care meeting due by:  11/29    Joneen Roach, AGACNP-BC [facility] Pager [phone] or [phone]  08/30/2016 8:52 AM    Addendum : patient had a complicated course.  Family decided to make pt comfort care. pls see last full progress note :  PULMONARY / CRITICAL CARE MEDICINE   Name: [name] MRN: [id number] DOB: 1926/10/08    ADMISSION DATE:  08/22/2016 CONSULTATION DATE:  08/07/2016  REFERRING MD: Dr. Dalene Seltzer  CHIEF COMPLAINT: Cardiac arrest  HISTORY OF PRESENT ILLNESS:  82 y.o. female with extensive cardiac history with recent admission for TAVR, which was without complication and she recovered well from. She has been admitted twice since then with syncope and then pulmonary edema secondary to AF RVR. She is on coumadin. She was just discharged 11/22 from her AFRVR admission and was doing well, however, later that day while sitting at home and talking on the phone she became unresponsive. Witnessed by nephew who started CPR after calling EMS. 10 mins later EMS arrived and noted her to be in VF. She  was defibrillated and given several rounds of epinephrine. Down-time estimated at about 30 mins. In ED she was intubated and found to have R sided pneumothorax. PCCM asked to admit.  SUBJECTIVE: Patient re-warmed on 11/24. No acute events overnight.  Patient does not appear to be triggering ventilator on CPAP.  REVIEW OF SYSTEMS:  Unable to obtain with patient intubated & with altered mentation.  VITAL SIGNS: BP (!) 155/60   Pulse (!) 119   Temp 98.8 F (37.1 C)   Resp 19   Ht 5\' 6"  (1.676 m)   Wt 157 lb 1.6 oz (71.3 kg)   SpO2 100%   BMI 25.36 kg/m   HEMODYNAMICS:    VENTILATOR SETTINGS:    INTAKE / OUTPUT: I/O last 3 completed shifts: In: 939 [I.V.:689; NG/GT:150; IV Piggyback:100] Out: 480 [Urine:445; Chest Tube:35]  PHYSICAL EXAMINATION: General:  Elderly female. Family friend at bedside. Eyes open with upward gaze. Neuro:  Not following commands.Triple flexion bilateral lower extremities. No withdrawal to pain in extremities. Not attending to voice or following commands. HEENT:  Endotracheal tube in place. No scleral icterus or injection. Cardiovascular:  Regular rate with irregular rhythm. Atrial fibrillation on telemetry. No edema.  Pulmonary: Symmetric chest wall rise on ventilator. Coarse breath sounds bilaterally.  Abdomen:  Soft. Nondistended. Integument: Cool. Mottling noted in lower extremities with bluish toes.   LABS:  BMET  Recent Labs Lab 08/26/16 0500 08/26/16 1705 03-Sep-2016 0435  NA 134* 134* 135  K 5.8* 4.8 4.5  CL 106 106 106  CO2 18* 18* 19*  BUN 29* 35* 38*  CREATININE 1.30* 1.47* 1.39*  GLUCOSE 264* 145* 183*    Electrolytes  Recent Labs Lab 08/25/16 0500  08/26/16 0500 08/26/16 1705 03-Sep-2016 0435  CALCIUM 9.0  < > 8.8* 8.6* 8.6*  MG 1.8  --  2.3  --  2.1  PHOS 4.1  4.2  --  6.9*  --  5.2*  < > = values in this interval not displayed.  CBC  Recent Labs Lab 08/25/16 0500 08/26/16 0500 09-03-2016 0435  WBC 18.4* 16.4*  10.6*  HGB 11.7* 10.8* 9.6*  HCT 35.6* 33.9* 29.5*  PLT 253 291 196    Coag's  Recent Labs Lab 08/24/16 0630 08/25/16 0500 08/26/16 0500 09-03-16 0435  APTT 43*  --   --   --   INR 2.20 3.14 3.66 3.73    Sepsis Markers  Recent Labs Lab 08/07/2016 1952 08/28/2016 2251  LATICACIDVEN 5.71* 4.48*    ABG  Recent Labs Lab 08/26/2016 2007 08/24/16 0223 Sep 03, 2016 0416  PHART 7.276* 7.349* 7.375  PCO2ART 43.3 36.7 32.8  PO2ART 148.0* 94.0 126*    Liver Enzymes  Recent Labs Lab 08/06/2016 1949 08/25/16 0500 08/26/16 0500 09-03-16 0435  AST 72*  --   --   --   ALT 29  --   --   --   ALKPHOS 79  --   --   --   BILITOT 0.3  --   --   --   ALBUMIN 2.4* 2.9* 2.7* 2.5*    Cardiac Enzymes  Recent Labs Lab 08/24/16 0545 08/24/16 1032 08/24/16 1632  TROPONINI 0.07* 0.05* 0.05*    Glucose  Recent Labs Lab 08/26/16 2202 08/26/16 2301 2016/09/03 0003 09-03-16 0329 09-03-2016 0845 09/03/2016 1206  GLUCAP 157* 160* 152* 180* 118* 101*    Imaging No results found.   STUDIES:  EEG 11/24:  Near complete suppression of all cortical brain activity. The findings consistent with severe diffuse global brain injury as can be seen from hypoxic-ischemic injury. LIMITED ECHO 11/24:  LV normal in size w/ EF 20-25%. Severe diffuse hypokinesis. Profound systolic dyssynchrony. RV normal in size w/ moderate reduction in systolic function. LA severely  dilated. S/P TAVR w/ mild perivalvular regurg. Mild to moderate mitral regurg.  CT HEAD W/O 11/25: IMPRESSION: 1. No evidence of acute intracranial abnormality, with mild study limitations detailed above. No intracranial mass, hemorrhage or edema identified. 2. Decreased size of the soft tissue hematoma overlying the right frontal bone compared to earlier head CT of 07/14/2016. No underlying fracture.  MICROBIOLOGY: MRSA PCR 11/20:  Negative MRSA PCR 11/23:  Negative Blood Ctx x2 11/22 >>  ANTIBIOTICS: Cefepime 11/22 >> Vancomycin  11/22 >>  SIGNIFICANT EVENTS: 11/22 - discharge, subsequent cardiac arrest 11/24 - rewarmed  LINES/TUBES: OETT 7.0 11/22 >> R Chest Tube 11/22 >> R IJ CVL 11/23 >> L Radial Art Line 11/23 >> Foley 11/22 >> OGT 11/22 >> PIV  ASSESSMENT / PLAN:  NEUROLOGIC A:   Acute Encephalopathy - Likely anoxic. S/P therapeutic hypothermia. Rewarmed 11/24. Sedation on Ventilator  P:   RASS goal: 0 to -1 Versed IV prn Sedation Fentanyl IV prn Pain Consulting Neurology  PULMONARY A: Acute Hypoxic Respiratory Failure - Secondary to cardiac arrest. Right Sided Pneumothorax - S/P Chest tube.  P:   Full vent support Intermittent ABG & Portable CXR Continuing Chest Tube to Suction SBT & PS Wean as/if mental status improves  CARDIOVASCULAR A:  Shock - Likely cardiogenic. Slow improvement. S/P Cardiac Arrest - Ventricular fibrillation. Acute on Chronic Systolic CHF Atrial Fibrillation with RVR - Repeated admissions. Now on amiodarone, had some bradycardia but unclear that related to the medication.  S/P TAVR 07/2016 S/P Pacemaker H/O Aortic Stenosis, HTN, & HLD  P:  Continuous Telemetry Monitoring Vitals per unit protocol Off Levophed Goal MAP >65 Diltiazem gtt off Amiodarone gtt Cardiology Following- Appreciate recommendations Holding on further diuresis   RENAL A:   Acute Renal Failure - Improving slightly. Hyperkalemia - Resolved. Hypokalemia - Resolved. Oliguria - Improving.  P:   Trending UOP with Foley Monitoring electrolytes & renal function daily as well as per protocol Replacing electrolytes as indicated  GASTROINTESTINAL A:   No acute issues  P:   NPO Pepcid IV q12hr Holding on tube feedings  HEMATOLOGIC A:   Coagulopathy - Secondary to Warfarin. Anemia - No signs of active bleeding. Leukocytosis - Resolved.  P:  Trending cell counts w/ CBC Trending INR daily Holding Warfarin Heparin gtt per pharmacy protocol  INFECTIOUS A:   No evidence  of acute infection.  P:   Empiric Vancomycin & Cefepime Day #5 Awaiting culture results Trending procalcitonin per algorithm   ENDOCRINE A:   H/O DM  - Glucose now controlled off of insulin drip.  P:   Accu-Checks q4hr SSI per Standard Algorithm Lantus 10u [place] qhs  FAMILY  - Updates: Family member updated 11/26 at bedside by Dr. Jamison NeighborNestor.  - Inter-disciplinary family meet or Palliative Care meeting due by:  11/29   TODAY'S SUMMARY:  82 y.o. female with cardiac history just discharged 11/22 presents same day after cardiac arrest 30 mins VF. R sided PTX CT placed in ED. Suspect significant anoxic brain injury. Consult neurology for further assessment and recommendations. Continuing ventilator support for now. Consult hospital chaplain for spiritual support. Further goals of care discussion after evaluation by neurology.  I have spent a total of 32 minutes caring for the patient, updating family member at bedside, and reviewing the patient's electronic medical record.   Donna ChristenJennings E. Jamison NeighborNestor, M.D. [facility] Pager:  [phone] After 3pm or if no response, call [phone] 08/30/2016, 8:53 AM     [facility] PCCM  I extensively d/w pt's daughter over all poor prognosis and condition.  Family was in the room as well. Daughter has decided to make pt comfort care.   Will transition to comfort care.  RN aware of plan.    Pollie Meyer, MD 08/30/2016, 8:53 AM [facility] Pager [phone] After 3 pm or if no answer, call [phone]   Patient was pronounced dead at September 17, 2023 at 1535.  Family at bedside.  Pollie Meyer, MD 08/30/2016, 8:54 AM  Pulmonary and Critical Care Pager [phone] After 3 pm or if no answer, call [phone]

## 2016-09-01 NOTE — Progress Notes (Signed)
Patient HR 130s-150s. Cards Fellow (Fudim) called and notified. Verbal order for 5mg  of Metoprolol IV once, given. Will give medication and continue to monitor patient.

## 2016-09-01 NOTE — Progress Notes (Signed)
Responded to page for failing DNR patient with several l family members in rm. Provided emotional/spiritual support and prayer. Chaplain available for follow-up.    Nov 21, 2015 1100  Clinical Encounter Type  Visited With Patient and family together  Visit Type Initial;Psychological support;Spiritual support;Social support;Critical Care  Referral From Nurse  Spiritual Encounters  Spiritual Needs Prayer;Emotional;Grief support  Stress Factors  Patient Stress Factors Health changes;Loss of control  Family Stress Factors Family relationships;Health changes;Loss of control   Ephraim Hamburgerynthia A Aeva Posey, [address]

## 2016-09-01 NOTE — Progress Notes (Signed)
   [facility] PCCM  I extensively d/w pt's daughter over all poor prognosis and condition.  Family was in the room as well. Daughter has decided to make pt comfort care.   Will transition to comfort care.  RN aware of plan.    Pollie MeyerJ. Angelo A de Dios, MD 2016-05-08, 2:21 PM [facility] Pager [phone] After 3 pm or if no answer, call [phone]

## 2016-09-01 NOTE — Progress Notes (Signed)
Family wishes to withdraw all life sustaining treatment. RN has contacted Dr Jamison NeighborNestor with their decision.

## 2016-09-01 NOTE — Progress Notes (Signed)
Daughter of patient expressed that she may not want to have neurology evaluate given the present situation. She is considering a terminal wean at this time and is consulting with other family members. RN has notified Dr. Jamison NeighborNestor and will update once family comes to a decision. Will continue to monitor.

## 2016-09-01 NOTE — Progress Notes (Signed)
Patient Name: [name] Date of Encounter: Sep 12, 2016  Principal Problem:   Cardiac arrest [facility]) Active Problems:   S/P TAVR (transcatheter aortic valve replacement)   Current use of long term anticoagulation   Atrial fibrillation with RVR (HCC)   Acute respiratory failure with hypoxia (HCC)   Pneumothorax, right   Cardiogenic shock (HCC)   Hyperkalemia   Length of Stay: 4  SUBJECTIVE 82 year old female with a past medical history of TAVR (45m EParticia Lather3 valve) on 120/06/4708 chronic diastolic CHF, DM, HLD, HTN, and  tachy-brady syndrome s/p SJM single chamber PPM, now in persistent a-fib. LVEF 55%. She presented to the ED on 08/30/2016 with SOB and rapid Afib associated with SOB. She was diuresed and HR was controlled, discharged 11/21  Normal electrolytes and crea at the time. Results were still modestly rapid on telemetry review. She was at home and her family witnessed her lose consciousness with immediate CPR attempt, in VF upon EMS arrival, back to SR after to shocks, required epinephrine for hypotension.   Echocardiogram 11/24  20-25% severe LAE <<< 11/2  EF 55%   In a-fib with RVR on arrival to the hospital.  Started on Amiodarone and dilt for a-fib with RVR.  Levothroid weaned off overnight.  She remains intubated.  Unresponsive to voice or pain  CURRENT MEDS . ceFEPime (MAXIPIME) IV  1 g Intravenous Q24H  . chlorhexidine gluconate (MEDLINE KIT)  15 mL Mouth Rinse BID  . famotidine (PEPCID) IV  20 mg Intravenous Q24H  . insulin aspart  2-6 Units Subcutaneous Q4H  . insulin glargine  10 Units Subcutaneous Q24H  . mouth rinse  15 mL Mouth Rinse 10 times per day  . sodium chloride  1,000 mL Intravenous Once   . sodium chloride    . amiodarone 30 mg/hr (08/26/16 2203)  . diltiazem (CARDIZEM) infusion Stopped (08/26/16 1100)  . norepinephrine (LEVOPHED) Adult infusion Stopped (08/26/16 2204)   OBJECTIVE  Vitals:   [id number] 112-Dec-[id number] 112-Dec-2017 0700 112-12-170847  BP:    (!) 130/48  Pulse:    (!) 143  Resp: 19 16 (!) 21 18  Temp:  98.6 F (37 C)    TempSrc:      SpO2:    100%  Weight:      Height:        Intake/Output Summary (Last 24 hours) at 1December 12, [id number] Last data filed at [id number]  Gross per 24 hour  Intake           889.73 ml  Output              480 ml  Net           409.73 ml   Filed Weights   08/30/2016 2200 08/24/16 0000 112/12/170400  Weight: 147 lb 7.8 oz (66.9 kg) 154 lb 5.2 oz (70 kg) 157 lb 1.6 oz (71.3 kg)   PHYSICAL EXAM  Intubated,  Unresponsive . HENT normal Neck supple  Clear Irregularly irregular rate and rhythm with rapid  ventricular response, no murmurs or gallops Abd-soft with active BS without hepatomegaly No Clubbing cyanosis edema Skin-warm and dry unrepsonsive  .  Telemetry Personally reviewed  Mean Afib 110   Accessory Clinical Findings  CBC  Recent Labs  08/26/16 0500 [id number]-Dec-[id number]  WBC 16.4* 10.6*  NEUTROABS  --  9.4*  HGB 10.8* 9.6*  HCT 33.9* 29.5*  MCV 90.6 87.8  PLT 291 196   Basic  Metabolic Panel  Recent Labs  08/26/16 0500 08/26/16 1705 09-02-2016 0435  NA 134* 134* 135  K 5.8* 4.8 4.5  CL 106 106 106  CO2 18* 18* 19*  GLUCOSE 264* 145* 183*  BUN 29* 35* 38*  CREATININE 1.30* 1.47* 1.39*  CALCIUM 8.8* 8.6* 8.6*  MG 2.3  --  2.1  PHOS 6.9*  --  5.2*   Liver Function Tests  Recent Labs  08/26/16 0500 2016-09-02 0435  ALBUMIN 2.7* 2.5*   No results for input(s): LIPASE, AMYLASE in the last 72 hours. Cardiac Enzymes  Recent Labs  08/24/16 1032 08/24/16 1632  TROPONINI 0.05* 0.05*   Dg Chest 2 View  Result Date: 08/21/2016 CLINICAL DATA:  Shortness of breath starting at 8 p.m. and gradually worsening. History of atrial fibrillation and hypertension. EXAM: CHEST  2 VIEW COMPARISON:  07/14/2016 FINDINGS: Postoperative changes in the mediastinum. Cardiac pacemaker. Mild cardiac enlargement. Increasing interstitial pattern to the lungs  mostly in the periphery and lung bases, likely representing developing edema. Small bilateral pleural effusions. No focal consolidation. No pneumothorax. Calcified and tortuous aorta. Degenerative changes in the spine and shoulders. IMPRESSION: Cardiac enlargement with increasing interstitial pattern to the lungs suggesting developing interstitial edema. Small bilateral pleural effusions. Electronically Signed   By: Lucienne Capers M.D.   On: 08/21/2016 01:18   Ct Head Wo Contrast  Result Date: 08/26/2016 CLINICAL DATA:  Cardiac arrest with possible anoxic injury. EXAM: CT HEAD WITHOUT CONTRAST TECHNIQUE: Contiguous axial images were obtained from the base of the skull through the vertex without intravenous contrast. COMPARISON:  CT head dated 07/14/2016. FINDINGS: Brain: Characterization is somewhat limited by streak artifact, likely related to hardware overlying the frontal bones. Ventricles appears stable in size and configuration. Mild chronic small vessel ischemic change again noted within the deep periventricular white matter regions bilaterally. All other areas of the brain demonstrate grossly normal gray-white matter attenuation. There is no mass, hemorrhage, edema or other evidence of acute parenchymal abnormality. No extra-axial hemorrhage. Vascular: No hyperdense vessel or unexpected calcification. Skull: Normal. Negative for fracture or focal lesion. Sinuses/Orbits: No acute finding. Other: The soft tissue hematoma overlying the right frontal bone has decreased in size compared to the previous exam. No new soft tissue abnormality. IMPRESSION: 1. No evidence of acute intracranial abnormality, with mild study limitations detailed above. No intracranial mass, hemorrhage or edema identified. 2. Decreased size of the soft tissue hematoma overlying the right frontal bone compared to earlier head CT of 07/14/2016. No underlying fracture. Electronically Signed   By: Franki Cabot M.D.   On: 08/26/2016  18:37   Dg Chest Port 1 View  Result Date: 08/26/2016 CLINICAL DATA:  Acute respiratory failure EXAM: PORTABLE CHEST 1 VIEW COMPARISON:  08/24/2016 FINDINGS: Cardiac shadow remains enlarged. Changes consistent with prior TAVR are noted. Endotracheal tube, nasogastric catheter and right jugular central line are again seen and stable. Pacing device is again noted and stable. Considerable right-sided subcutaneous emphysema is noted but improved from the prior study. Right chest tube is again seen and stable. Increasing density in the left lung base is noted consistent with evolving infiltrate. IMPRESSION: Increasing right basilar infiltrate. Tubes and lines as described.  No pneumothorax is noted. Electronically Signed   By: Inez Catalina M.D.   On: 08/26/2016 07:46   Dg Chest Port 1 View  Result Date: 08/24/2016 CLINICAL DATA:  82 y/o  F; encounter for central line placement. EXAM: PORTABLE CHEST 1 VIEW COMPARISON:  08/22/2016 chest radiograph. FINDINGS:  Stable cardiac silhouette given projection and technique. Aortic valve replacement. Right central venous catheter tip projects over upper SVC. Right-sided chest tube unchanged in position. Extensive subcutaneous emphysema over the right-sided chest wall. Single lead pacemaker. Additional electronic device projects over lower mediastinum. Transcutaneous pacing pads on left chest wall. Trace if any residual right-sided pneumothorax with accentuation of silhouettes. Endotracheal tube unchanged in position. Stable patchy airspace opacities. IMPRESSION: Right central venous catheter tip projects over upper SVC. Other lines and tubes are stable. Minimal if any right-sided pneumothorax. Stable patchy airspace opacities may represent edema, pneumonia, or ARDS. Electronically Signed   By: Kristine Garbe M.D.   On: 08/24/2016 04:27   Dg Chest Portable 1 View  Result Date: 08/12/2016 CLINICAL DATA:  Chest tube placement.  Initial encounter. EXAM: PORTABLE  CHEST 1 VIEW COMPARISON:  Chest radiograph performed earlier today at 8:40 p.m. FINDINGS: The patient's endotracheal tube is seen ending 4-5 cm above the carina. An enteric tube is noted extending below the diaphragm. A right-sided chest tube is noted. No significant pneumothorax is seen. Worsening bilateral airspace opacification may reflect pneumonia or pulmonary edema. ARDS is a concern. No definite pleural effusion is seen. The cardiomediastinal silhouette is borderline enlarged. A pacemaker is noted overlying the left chest wall, with a lead ending overlying the right ventricle. A vascular stent is noted overlying the heart. Prominent soft tissue air is noted bilaterally, significantly increased on the right and tracking into the neck. External pacing pads are noted. IMPRESSION: 1. Endotracheal tube seen ending 4-5 cm above the carina. 2. Right-sided chest tubes noted, without definite evidence of pneumothorax. 3. Worsening bilateral airspace opacification may reflect pneumonia or pulmonary edema. ARDS is a concern. 4. Borderline cardiomegaly. 5. Significantly worsened right-sided soft tissue air, tracking into the neck, status post placement of right-sided chest tube. Electronically Signed   By: Garald Balding M.D.   On: 08/10/2016 23:43   Dg Chest Portable 1 View  Result Date: 08/26/2016 CLINICAL DATA:  Repositioning of ET tube EXAM: PORTABLE CHEST 1 VIEW COMPARISON:  08/19/2016 FINDINGS: Interim repositioning of endotracheal tube, the tip is now 4.1 cm superior to the carina. Single lead left-sided pacing device remains in place. Valvular prosthesis again noted. Esophageal tube tip extends below the diaphragm. There is stable cardiomegaly. Hazy bilateral pulmonary infiltrates or edema, do not appear significantly changed. Interval increase in size of right-sided pneumothorax, demonstrating 2.2 cm of pleural-parenchymal separation at the apex. Pneumothorax is seen laterally as well as along the right  cardiophrenic sulcus. Increased subcutaneous emphysema over the right lateral chest wall. IMPRESSION: 1. Support lines and tubes as described above. 2. No significant interval change in cardiomegaly and bilateral interstitial and alveolar infiltrates or edema, right greater than left. 3. Slight interval increase in size of small moderate right-sided pneumothorax. Electronically Signed   By: Donavan Foil M.D.   On: 08/29/2016 21:00   Dg Chest Portable 1 View  Result Date: 08/24/2016 CLINICAL DATA:  Post intubation in CPR EXAM: PORTABLE CHEST 1 VIEW COMPARISON:  08/22/2016 FINDINGS: Endotracheal to appears high above the thoracic inlet approximately 13 cm from the carina. Recommend advancing 6 cm. NG tube in the stomach. Moderate volume RIGHT pneumothorax measures 11 mm from chest wall and occupies approximately 10-15% of lung volume. IMPRESSION: 1. Moderate volume RIGHT pneumothorax. 2. Endotracheal tube is high.  Recommend advancement by 6 cm. Critical Value/emergent results were called by telephone at the time of interpretation on 08/04/2016 at 8:10 pm to Dr. Gareth Morgan ,  who verbally acknowledged these results. Electronically Signed   By: Suzy Bouchard M.D.   On: 08/10/2016 20:11   Dg Chest Port 1 View  Result Date: 08/22/2016 CLINICAL DATA:  Follow-up pleural effusions.  Subsequent encounter. EXAM: PORTABLE CHEST 1 VIEW COMPARISON:  Chest radiograph performed 08/21/2016 FINDINGS: A small left pleural effusion is again noted, similar in appearance to the prior study. The right-sided pleural effusion appears to have largely resolved. Vascular congestion is noted. Increased interstitial markings may reflect mild interstitial edema or possibly pneumonia. No pneumothorax is seen. The cardiomediastinal silhouette is borderline normal in size. A stent is noted overlying the heart. A pacemaker is noted overlying the left chest wall, with a lead ending overlying the right ventricle. No acute osseous  abnormalities are seen. Degenerative change is noted at the right glenohumeral joint. IMPRESSION: Small left pleural effusion, similar in appearance to the prior study. Right-sided pleural effusion appears to have largely resolved. Vascular congestion noted. Increased interstitial markings may reflect mild interstitial edema or possibly pneumonia. Electronically Signed   By: Garald Balding M.D.   On: 08/22/2016 03:34  Telemetry Personally reviewed  heart rates are much better now averaging in the 75-85 range  LHC: 06/14/2016 1. Widely patent coronary arteries with no angiographic disease in the LAD, left main, or right coronary artery and minimal irregularity in the left circumflex 2. Calcified aortic valve with restricted leaflet motion and severe aortic stenosis with a mean gradient of 35 mmHg 3. Normal LV systolic function with mild mitral regurgitation by ventriculography 4. Normal aortic root without evidence of aneurysm, estimated angle for valve deployment LAO 5, caudal 5 5. Aborted right heart catheterization (estimated PASP by echo 37 mmHg)  TTE: 08/03/2016 Left ventricle: The cavity size was normal. Wall thickness was   normal. The estimated ejection fraction was 55%. Wall motion was   normal; there were no regional wall motion abnormalities.   Indeterminant diastolic function (atrial fibrillation). - Aortic valve: There is a bioprosthetic aortic valve s/p TAVR.   Mild peri-valvular regurgitation. No significant stenosis. Mean   gradient (S): 11 mm Hg. - Mitral valve: Moderately calcified annulus. Moderately calcified   leaflets . There was mild regurgitation. - Left atrium: The atrium was moderately dilated. - Right ventricle: The cavity size was normal. Pacer wire or   catheter noted in right ventricle. Systolic function was normal. - Right atrium: The atrium was moderately dilated. - Tricuspid valve: Peak RV-RA gradient (S): 35 mm Hg. - Pulmonary arteries: PA peak pressure: 43  mm Hg (S). - Systemic veins: IVC measured 2.0 cm with < 50% respirophasic   variation, suggestiing RA pressure 8 mmHg.  Impressions: - The patient was in atrial fibrillation. Normal LV size with EF   55%. Normal RV size and systolic function. Moderate biatrial   enlargement. Bioprosthetic aortic valve s/p TAVR. Mild   peri-valvular aortic insufficiency. Mild mitral regurgitation.   Mild pulmonary hypertension.    ASSESSMENT AND PLAN  Principal Problem:   Cardiac arrest [facility]) Active Problems:   S/P TAVR (transcatheter aortic valve replacement)   Current use of long term anticoagulation   Atrial fibrillation with RVR (HCC)   Acute respiratory failure with hypoxia (HCC)   Pneumothorax, right   Cardiogenic shock (HCC)   Hyperkalemia    1. VF arrest - I do not understand the mechanism of her cardiac arrest. she had clean coronaries in 06/2016, s/p TAVR with normally functioning valve and normal LVEF, normal electrolytes including K, Na, Mg.  Right now however remained in the middle of the [place]     2. Atrial fibrillation with RVR:   3. Acute on chronic diastolic and systolic  CHF  5. Hyperkalemia - Kayelxalate given yesterday  stable today  7. Shock- resolved   8.  Acidemia persisting. Respiratory compensation  AFib rate faster off dilt, but w mean at 105-110, not too bad per RACE2 and prob in some part compensatory  Will begin meto ivl today  This should help with cardiomyopathy as well as HR   Will transition to carvedilol when able to take PO  Spoke with her sister;  No improvement in mental function  At some point will need to address the issue of extubation  Continue cardiac supportive care   Signed, Virl Axe, MD 09-25-16

## 2016-09-01 NOTE — Plan of Care (Signed)
  Interdisciplinary Goals of Care Family Meeting   Date carried out:: 08/22/2016  Location of the meeting: Bedside  Member's involved: Physician, Bedside Registered Nurse and Family Member or next of kin  Durable Power of Attorney or acting medical decision maker: Earnstine RegalBeth Middleton, HCPOA    Discussion: We discussed goals of care for [name] .  Discussed her poor neurological status. HCPOA has decided on a DNR. Patient will remain intubated while awaiting Neurology assessment.  Code status: Full Code  Disposition: Continue current acute care  Time spent for the meeting: 4 minutes.  Lawanda CousinsJennings Indianna Boran 08/26/2016, 11:28 AM

## 2016-09-01 NOTE — Procedures (Signed)
Extubation Procedure Note  Patient Details:   Name: [name] DOB: 05/27/1927 MRN: [id number]   Airway Documentation:  Airway 7 mm (Active)  Secured at (cm) 25 cm 08/05/2016 12:10 PM  Measured From Lips 08/15/2016  1:00 PM  Secured Location Center 08/18/2016  1:00 PM  Secured By [employer] Tube Holder 08/11/2016  1:00 PM  Tube Holder Repositioned Yes 08/19/2016  1:00 PM  Cuff Pressure (cm H2O) 24 cm H2O 08/02/2016 12:14 AM  Site Condition Dry 08/08/2016  1:00 PM    Evaluation  O2 sats: stable throughout Complications: No apparent complications Patient did tolerate procedure well. Bilateral Breath Sounds: Rhonchi, Diminished   No   Patient was extubated to room air per end of life withdrawal order. Patient did have cuff leak. RN was at bedside with RT during extubation.  Danella Maiersshley M Roxy Filler 08/13/2016, 3:01 PM

## 2016-09-01 NOTE — Progress Notes (Signed)
Patient extubated at 1450. Placed on fentanyl and versed drip at 1500. Patient passed away at 1535 with time of death witnessed by [facility]Chammy RN and Edwina BarthHannah RN. E link notified of death.

## 2016-09-01 NOTE — Progress Notes (Signed)
Wasted fentanyl from bag 2050 mcg. Wasted Midazolam from bag 20mg .   Witness wasted products down the sink after patient expired. Dahlia ClientHannah RN witnessed waste with Shammy RN being primary care giver to deceased.

## 2016-09-01 NOTE — Progress Notes (Addendum)
PULMONARY / CRITICAL CARE MEDICINE   Name: [name] MRN: [id number] DOB: 08-Apr-1927    ADMISSION DATE:  28-Aug-2016 CONSULTATION DATE:  August 28, 2016  REFERRING MD: Dr. Dalene Seltzer  CHIEF COMPLAINT: Cardiac arrest  HISTORY OF PRESENT ILLNESS:  82 y.o. female with extensive cardiac history with recent admission for TAVR, which was without complication and she recovered well from. She has been admitted twice since then with syncope and then pulmonary edema secondary to AF RVR. She is on coumadin. She was just discharged 11/22 from her AFRVR admission and was doing well, however, later that day while sitting at home and talking on the phone she became unresponsive. Witnessed by nephew who started CPR after calling EMS. 10 mins later EMS arrived and noted her to be in VF. She was defibrillated and given several rounds of epinephrine. Down-time estimated at about 30 mins. In ED she was intubated and found to have R sided pneumothorax. PCCM asked to admit.  SUBJECTIVE: Patient re-warmed on 11/24. No acute events overnight. Patient does not appear to be triggering ventilator on CPAP.  REVIEW OF SYSTEMS:  Unable to obtain with patient intubated & with altered mentation.  VITAL SIGNS: BP (!) 130/48   Pulse (!) 143   Temp 97.7 F (36.5 C) (Axillary)   Resp 19   Ht 5\' 6"  (1.676 m)   Wt 157 lb 1.6 oz (71.3 kg)   SpO2 98%   BMI 25.36 kg/m   HEMODYNAMICS: CVP:  [8 mmHg-11 mmHg] 10 mmHg  VENTILATOR SETTINGS: Vent Mode: PRVC FiO2 (%):  [40 %] 40 % Set Rate:  [16 bmp] 16 bmp Vt Set:  [500 mL] 500 mL PEEP:  [5 cmH20] 5 cmH20 Plateau Pressure:  [15 cmH20-22 cmH20] 22 cmH20  INTAKE / OUTPUT: I/O last 3 completed shifts: In: 3575.3 [I.V.:3275.3; NG/GT:150; IV Piggyback:150] Out: 635 [Urine:600; Chest Tube:35]  PHYSICAL EXAMINATION: General:  Elderly female. Family friend at bedside. Eyes open with upward gaze. Neuro:  Not following commands.Triple flexion bilateral lower extremities. No  withdrawal to pain in extremities. Not attending to voice or following commands. HEENT:  Endotracheal tube in place. No scleral icterus or injection. Cardiovascular:  Regular rate with irregular rhythm. Atrial fibrillation on telemetry. No edema.  Pulmonary: Symmetric chest wall rise on ventilator. Coarse breath sounds bilaterally.  Abdomen:  Soft. Nondistended. Integument: Cool. Mottling noted in lower extremities with bluish toes.   LABS:  BMET  Recent Labs Lab 08/26/16 0500 08/26/16 1705 08/26/2016 0435  NA 134* 134* 135  K 5.8* 4.8 4.5  CL 106 106 106  CO2 18* 18* 19*  BUN 29* 35* 38*  CREATININE 1.30* 1.47* 1.39*  GLUCOSE 264* 145* 183*    Electrolytes  Recent Labs Lab 08/25/16 0500  08/26/16 0500 08/26/16 1705 08/29/2016 0435  CALCIUM 9.0  < > 8.8* 8.6* 8.6*  MG 1.8  --  2.3  --  2.1  PHOS 4.1  4.2  --  6.9*  --  5.2*  < > = values in this interval not displayed.  CBC  Recent Labs Lab 08/25/16 0500 08/26/16 0500 08/28/2016 0435  WBC 18.4* 16.4* 10.6*  HGB 11.7* 10.8* 9.6*  HCT 35.6* 33.9* 29.5*  PLT 253 291 196    Coag's  Recent Labs Lab 08/22/2016 2344  08/24/16 0630 08/25/16 0500 08/26/16 0500 08/05/2016 0435  APTT 55*  --  43*  --   --   --   INR 4.31*  < > 2.20 3.14 3.66 3.73  < > =  values in this interval not displayed.  Sepsis Markers  Recent Labs Lab 08/07/2016 1952 08/18/2016 2251  LATICACIDVEN 5.71* 4.48*    ABG  Recent Labs Lab 08/16/2016 2007 08/24/16 0223 11/04/15 0416  PHART 7.276* 7.349* 7.375  PCO2ART 43.3 36.7 32.8  PO2ART 148.0* 94.0 126*    Liver Enzymes  Recent Labs Lab 08/21/16 0650 08/07/2016 1949 08/25/16 0500 08/26/16 0500 11/04/15 0435  AST 31 72*  --   --   --   ALT 16 29  --   --   --   ALKPHOS 81 79  --   --   --   BILITOT 1.1 0.3  --   --   --   ALBUMIN 3.9 2.4* 2.9* 2.7* 2.5*    Cardiac Enzymes  Recent Labs Lab 08/24/16 0545 08/24/16 1032 08/24/16 1632  TROPONINI 0.07* 0.05* 0.05*     Glucose  Recent Labs Lab 08/26/16 1957 08/26/16 2059 08/26/16 2202 08/26/16 2301 11/04/15 0003 11/04/15 0329  GLUCAP 169* 153* 157* 160* 152* 180*    Imaging Ct Head Wo Contrast  Result Date: 08/26/2016 CLINICAL DATA:  Cardiac arrest with possible anoxic injury. EXAM: CT HEAD WITHOUT CONTRAST TECHNIQUE: Contiguous axial images were obtained from the base of the skull through the vertex without intravenous contrast. COMPARISON:  CT head dated 07/14/2016. FINDINGS: Brain: Characterization is somewhat limited by streak artifact, likely related to hardware overlying the frontal bones. Ventricles appears stable in size and configuration. Mild chronic small vessel ischemic change again noted within the deep periventricular white matter regions bilaterally. All other areas of the brain demonstrate grossly normal gray-white matter attenuation. There is no mass, hemorrhage, edema or other evidence of acute parenchymal abnormality. No extra-axial hemorrhage. Vascular: No hyperdense vessel or unexpected calcification. Skull: Normal. Negative for fracture or focal lesion. Sinuses/Orbits: No acute finding. Other: The soft tissue hematoma overlying the right frontal bone has decreased in size compared to the previous exam. No new soft tissue abnormality. IMPRESSION: 1. No evidence of acute intracranial abnormality, with mild study limitations detailed above. No intracranial mass, hemorrhage or edema identified. 2. Decreased size of the soft tissue hematoma overlying the right frontal bone compared to earlier head CT of 07/14/2016. No underlying fracture. Electronically Signed   By: Bary RichardStan  Maynard M.D.   On: 08/26/2016 18:37     STUDIES:  EEG 11/24:  Near complete suppression of all cortical brain activity. The findings consistent with severe diffuse global brain injury as can be seen from hypoxic-ischemic injury. LIMITED ECHO 11/24:  LV normal in size w/ EF 20-25%. Severe diffuse hypokinesis. Profound  systolic dyssynchrony. RV normal in size w/ moderate reduction in systolic function. LA severely dilated. S/P TAVR w/ mild perivalvular regurg. Mild to moderate mitral regurg.  CT HEAD W/O 11/25: IMPRESSION: 1. No evidence of acute intracranial abnormality, with mild study limitations detailed above. No intracranial mass, hemorrhage or edema identified. 2. Decreased size of the soft tissue hematoma overlying the right frontal bone compared to earlier head CT of 07/14/2016. No underlying fracture.  MICROBIOLOGY: MRSA PCR 11/20:  Negative MRSA PCR 11/23:  Negative Blood Ctx x2 11/22 >>  ANTIBIOTICS: Cefepime 11/22 >> Vancomycin 11/22 >>  SIGNIFICANT EVENTS: 11/22 - discharge, subsequent cardiac arrest 11/24 - rewarmed  LINES/TUBES: OETT 7.0 11/22 >> R Chest Tube 11/22 >> R IJ CVL 11/23 >> L Radial Art Line 11/23 >> Foley 11/22 >> OGT 11/22 >> PIV  ASSESSMENT / PLAN:  NEUROLOGIC A:   Acute Encephalopathy - Likely anoxic.  S/P therapeutic hypothermia. Rewarmed 11/24. Sedation on Ventilator  P:   RASS goal: 0 to -1 Versed IV prn Sedation Fentanyl IV prn Pain Consulting Neurology  PULMONARY A: Acute Hypoxic Respiratory Failure - Secondary to cardiac arrest. Right Sided Pneumothorax - S/P Chest tube.  P:   Full vent support Intermittent ABG & Portable CXR Continuing Chest Tube to Suction SBT & PS Wean as/if mental status improves  CARDIOVASCULAR A:  Shock - Likely cardiogenic. Slow improvement. S/P Cardiac Arrest - Ventricular fibrillation. Acute on Chronic Systolic CHF Atrial Fibrillation with RVR - Repeated admissions. Now on amiodarone, had some bradycardia but unclear that related to the medication.  S/P TAVR 07/2016 S/P Pacemaker H/O Aortic Stenosis, HTN, & HLD  P:  Continuous Telemetry Monitoring Vitals per unit protocol Off Levophed Goal MAP >65 Diltiazem gtt off Amiodarone gtt Cardiology Following- Appreciate recommendations Holding on further  diuresis   RENAL A:   Acute Renal Failure - Improving slightly. Hyperkalemia - Resolved. Hypokalemia - Resolved. Oliguria - Improving.  P:   Trending UOP with Foley Monitoring electrolytes & renal function daily as well as per protocol Replacing electrolytes as indicated  GASTROINTESTINAL A:   No acute issues  P:   NPO Pepcid IV q12hr Holding on tube feedings  HEMATOLOGIC A:   Coagulopathy - Secondary to Warfarin. Anemia - No signs of active bleeding. Leukocytosis - Resolved.  P:  Trending cell counts w/ CBC Trending INR daily Holding Warfarin Heparin gtt per pharmacy protocol  INFECTIOUS A:   No evidence of acute infection.  P:   Empiric Vancomycin & Cefepime Day #5 Awaiting culture results Trending procalcitonin per algorithm   ENDOCRINE A:   H/O DM  - Glucose now controlled off of insulin drip.  P:   Accu-Checks q4hr SSI per Standard Algorithm Lantus 10u [place] qhs  FAMILY  - Updates: Family member updated 11/26 at bedside by Dr. Jamison NeighborNestor.  - Inter-disciplinary family meet or Palliative Care meeting due by:  11/29   TODAY'S SUMMARY:  82 y.o. female with cardiac history just discharged 11/22 presents same day after cardiac arrest 30 mins VF. R sided PTX CT placed in ED. Suspect significant anoxic brain injury. Consult neurology for further assessment and recommendations. Continuing ventilator support for now. Consult hospital chaplain for spiritual support. Further goals of care discussion after evaluation by neurology.  I have spent a total of 32 minutes caring for the patient, updating family member at bedside, and reviewing the patient's electronic medical record.   Donna ChristenJennings E. Jamison NeighborNestor, M.D. [facility] Pager:  [phone] After 3pm or if no response, call [phone] Nov 29, 2015, 10:22 AM

## 2016-09-01 DEATH — deceased

## 2016-09-14 ENCOUNTER — Telehealth: Payer: Self-pay | Admitting: Cardiovascular Disease

## 2016-09-14 NOTE — Telephone Encounter (Signed)
New message  Pt's daughter wants Dr. Excell Seltzerooper to contact her  She has a lot of questions as to what happened and why  Needs some answers and closure

## 2016-09-15 NOTE — Telephone Encounter (Signed)
Will forward note to Dr Excell Seltzerooper for documentation.

## 2016-09-15 NOTE — Telephone Encounter (Signed)
Spoke at length with daughter. Answered questions. She was very Adult nurseappreciative. thx

## 2016-10-23 ENCOUNTER — Ambulatory Visit: Payer: Medicare Other | Admitting: Cardiology

## 2016-10-27 ENCOUNTER — Encounter: Payer: Medicare Other | Admitting: Nurse Practitioner

## 2016-12-02 IMAGING — CT CT CERVICAL SPINE W/O CM
3 of 4 series · 15 of 33 positions shown, 18 images · non-contrast
Comparison: None.

CLINICAL DATA: Pain after fall.

EXAM:
CT HEAD WITHOUT CONTRAST
CT CERVICAL SPINE WITHOUT CONTRAST
TECHNIQUE: Multidetector CT imaging of the head and cervical spine was
performed following the standard protocol without intravenous
contrast. Multiplanar CT image reconstructions of the cervical spine
were also generated.

[Series 3: head bone · axial · 0.45mm/px · z∈[+1410,+1550]mm · 7 of 92 slices shown, 9 images]
[im 11/92  soft-tissue]
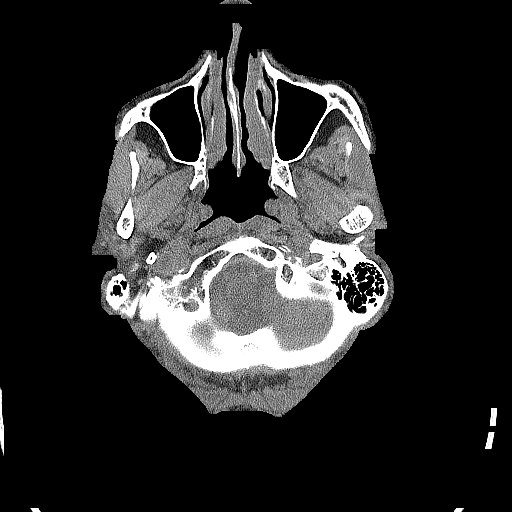
[im 11/92  bone]
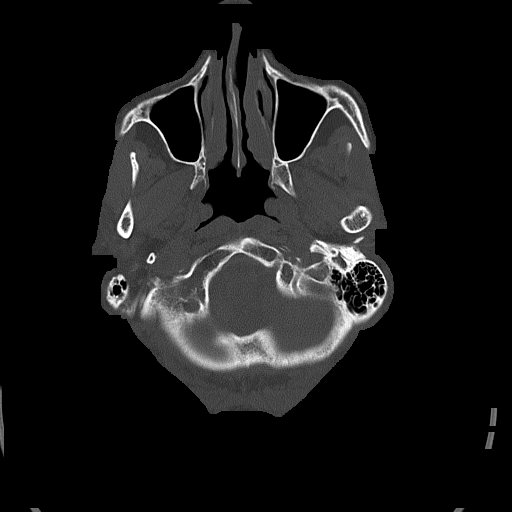
[im 21/92  bone]
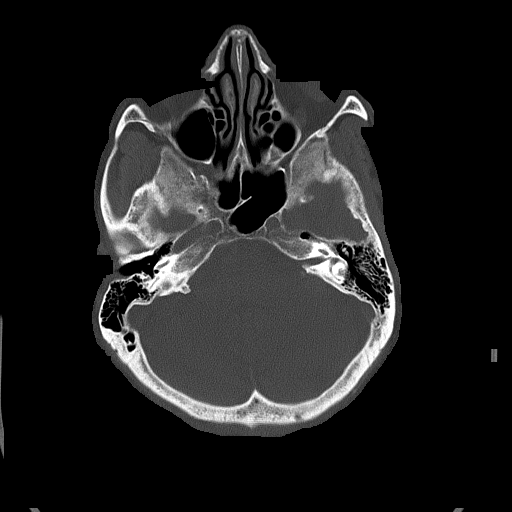
[im 31/92  bone]
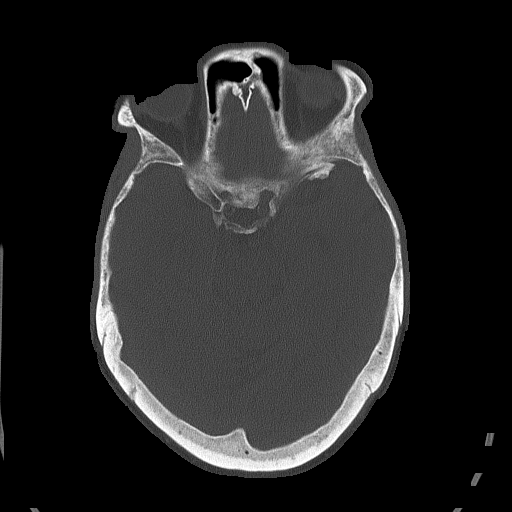
[im 51/92  bone]
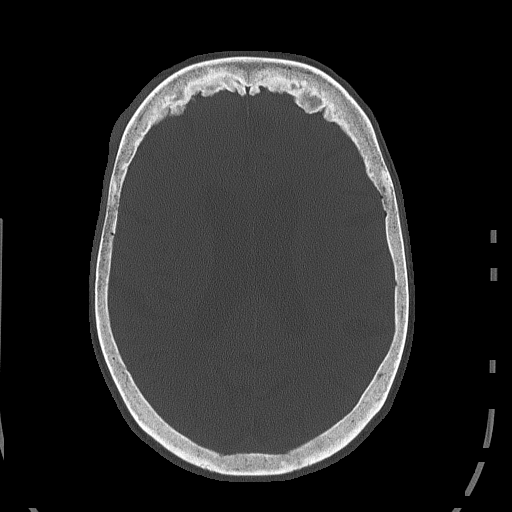
[im 61/92  soft-tissue]
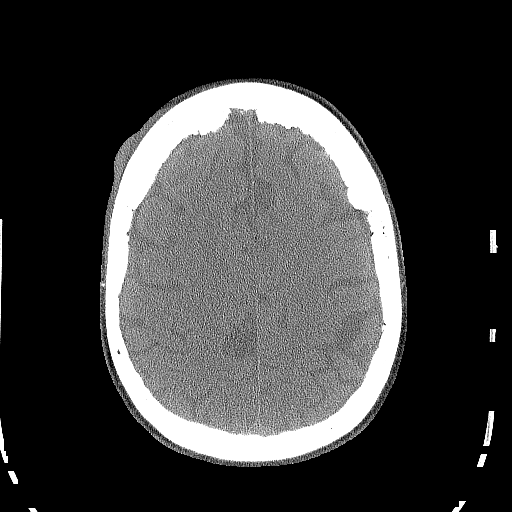
[im 61/92  bone]
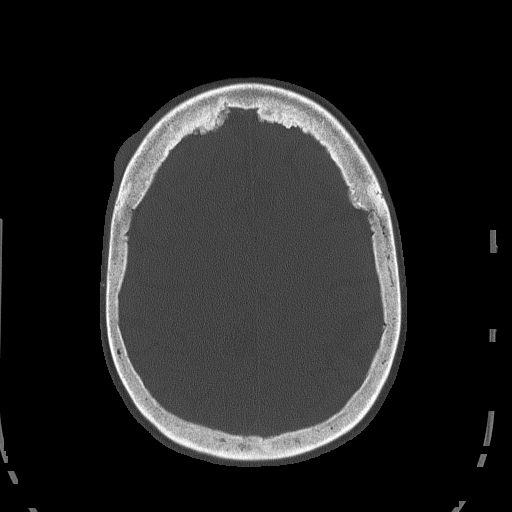
[im 71/92  bone]
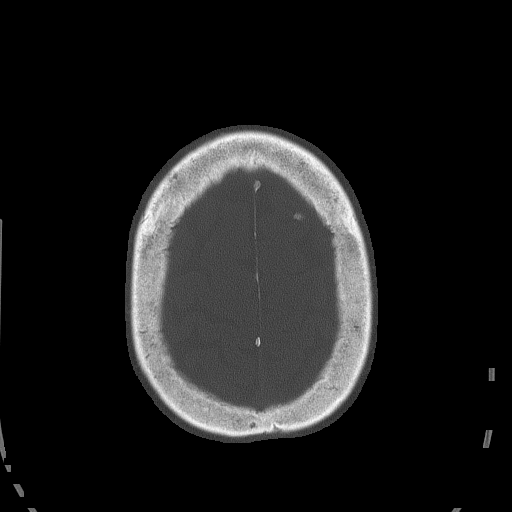
[im 81/92  bone]
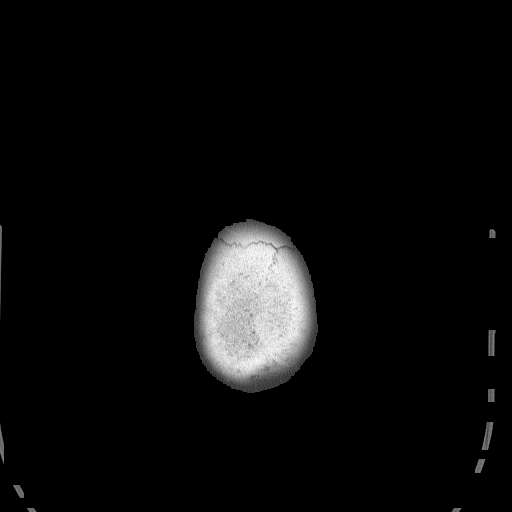

[Series 4: head without cor · coronal · non-contrast · 0.32mm/px · 3 of 63 slices shown]
[im 13/63  bone]
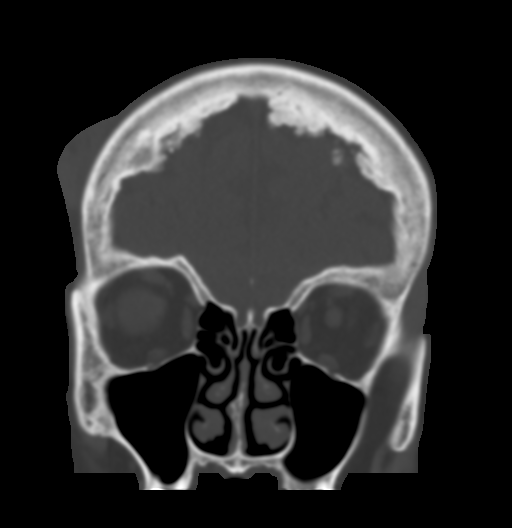
[im 25/63  bone]
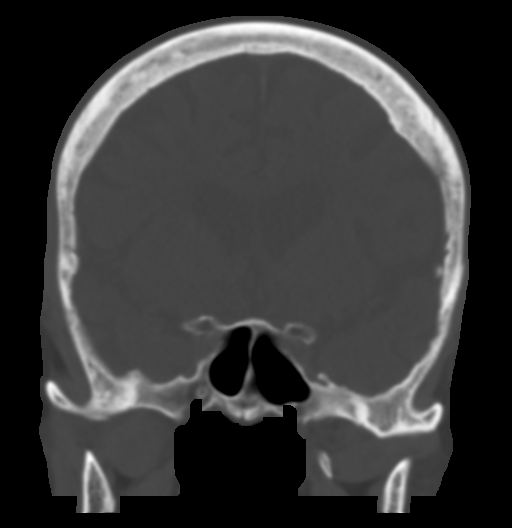
[im 38/63  bone]
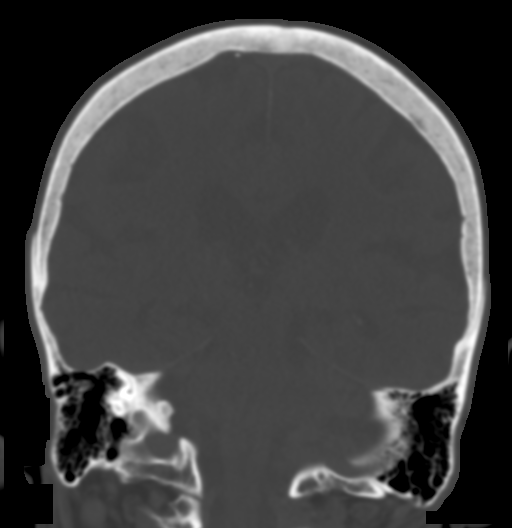

[Series 5: head without sag · sagittal · non-contrast · 0.35mm/px · 5 of 55 slices shown, 6 images]
[im 19/55  bone]
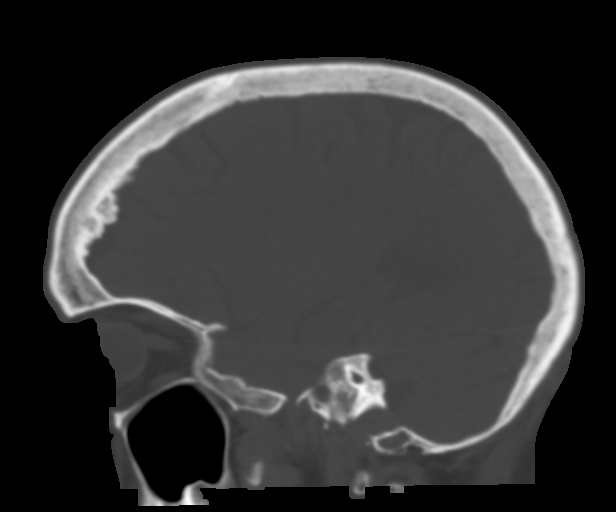
[im 23/55  bone]
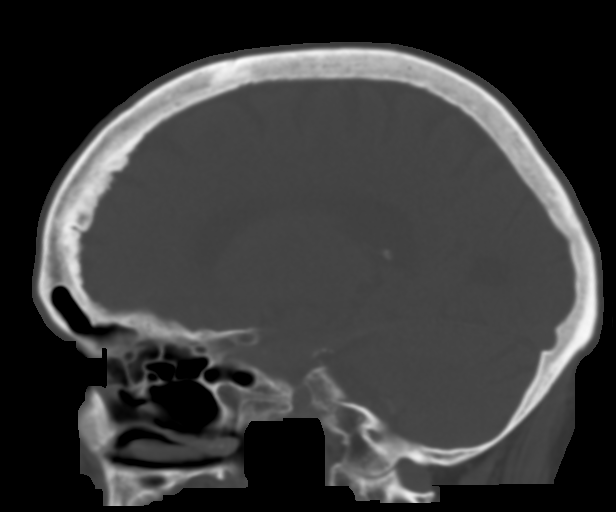
[im 28/55  soft-tissue]
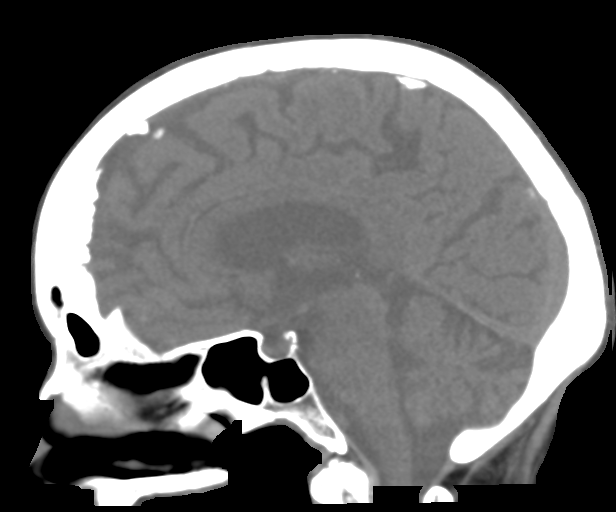
[im 28/55  bone]
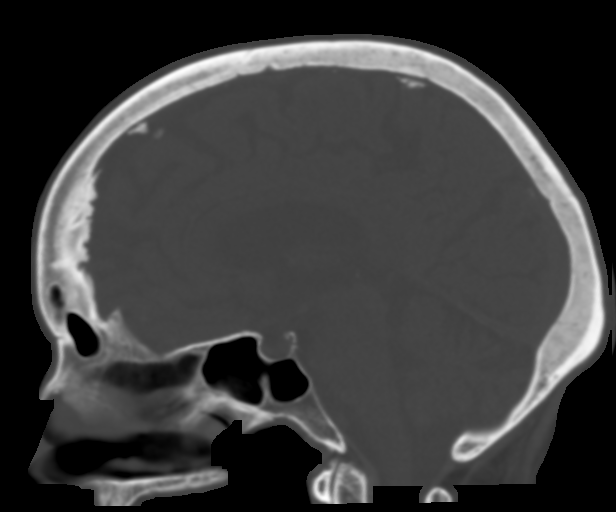
[im 32/55  bone]
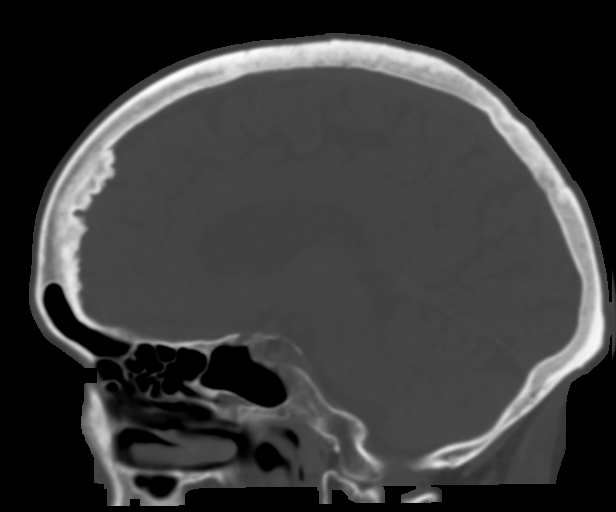
[im 37/55  bone]
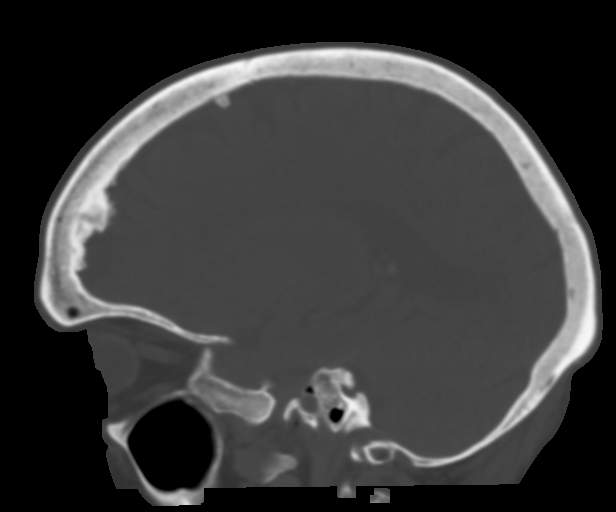

[15 of 33 positions shown; findings below may reference images not displayed]

FINDINGS: CT HEAD FINDINGS

Brain: No subdural, epidural, or subarachnoid hemorrhage. Ventricles
and sulci are normal for age. Cerebellum, brainstem, and basal
cisterns are normal. Scattered mild white matter changes. No
evidence of acute cortical ischemia or infarct. No mass, mass
effect, or midline shift.

Vascular: Calcified atherosclerosis in the intracranial carotid
arteries.

Skull: Normal. Negative for fracture or focal lesion.

Sinuses/Orbits: No acute finding.

Other: There is a hematoma over the right scalp. Extracranial soft
tissues otherwise normal.

CT CERVICAL SPINE FINDINGS

Alignment: Normal.

Skull base and vertebrae: No acute fracture. No primary bone lesion
or focal pathologic process.

Soft tissues and spinal canal: No prevertebral fluid or swelling. No
visible canal hematoma.

Disc levels:  Multilevel degenerative changes.

Upper chest: Negative.

Other: No other abnormalities.
IMPRESSION: 1. No acute intracranial abnormality.
2. No fracture or traumatic malalignment in the cervical spine.

## 2017-01-10 IMAGING — CR DG CHEST 1V PORT
1 series · 1 of 1 positions shown · non-contrast
Comparison: Chest radiograph performed 08/21/2016

CLINICAL DATA: Follow-up pleural effusions.  Subsequent encounter.

EXAM:
PORTABLE CHEST 1 VIEW

[AP]
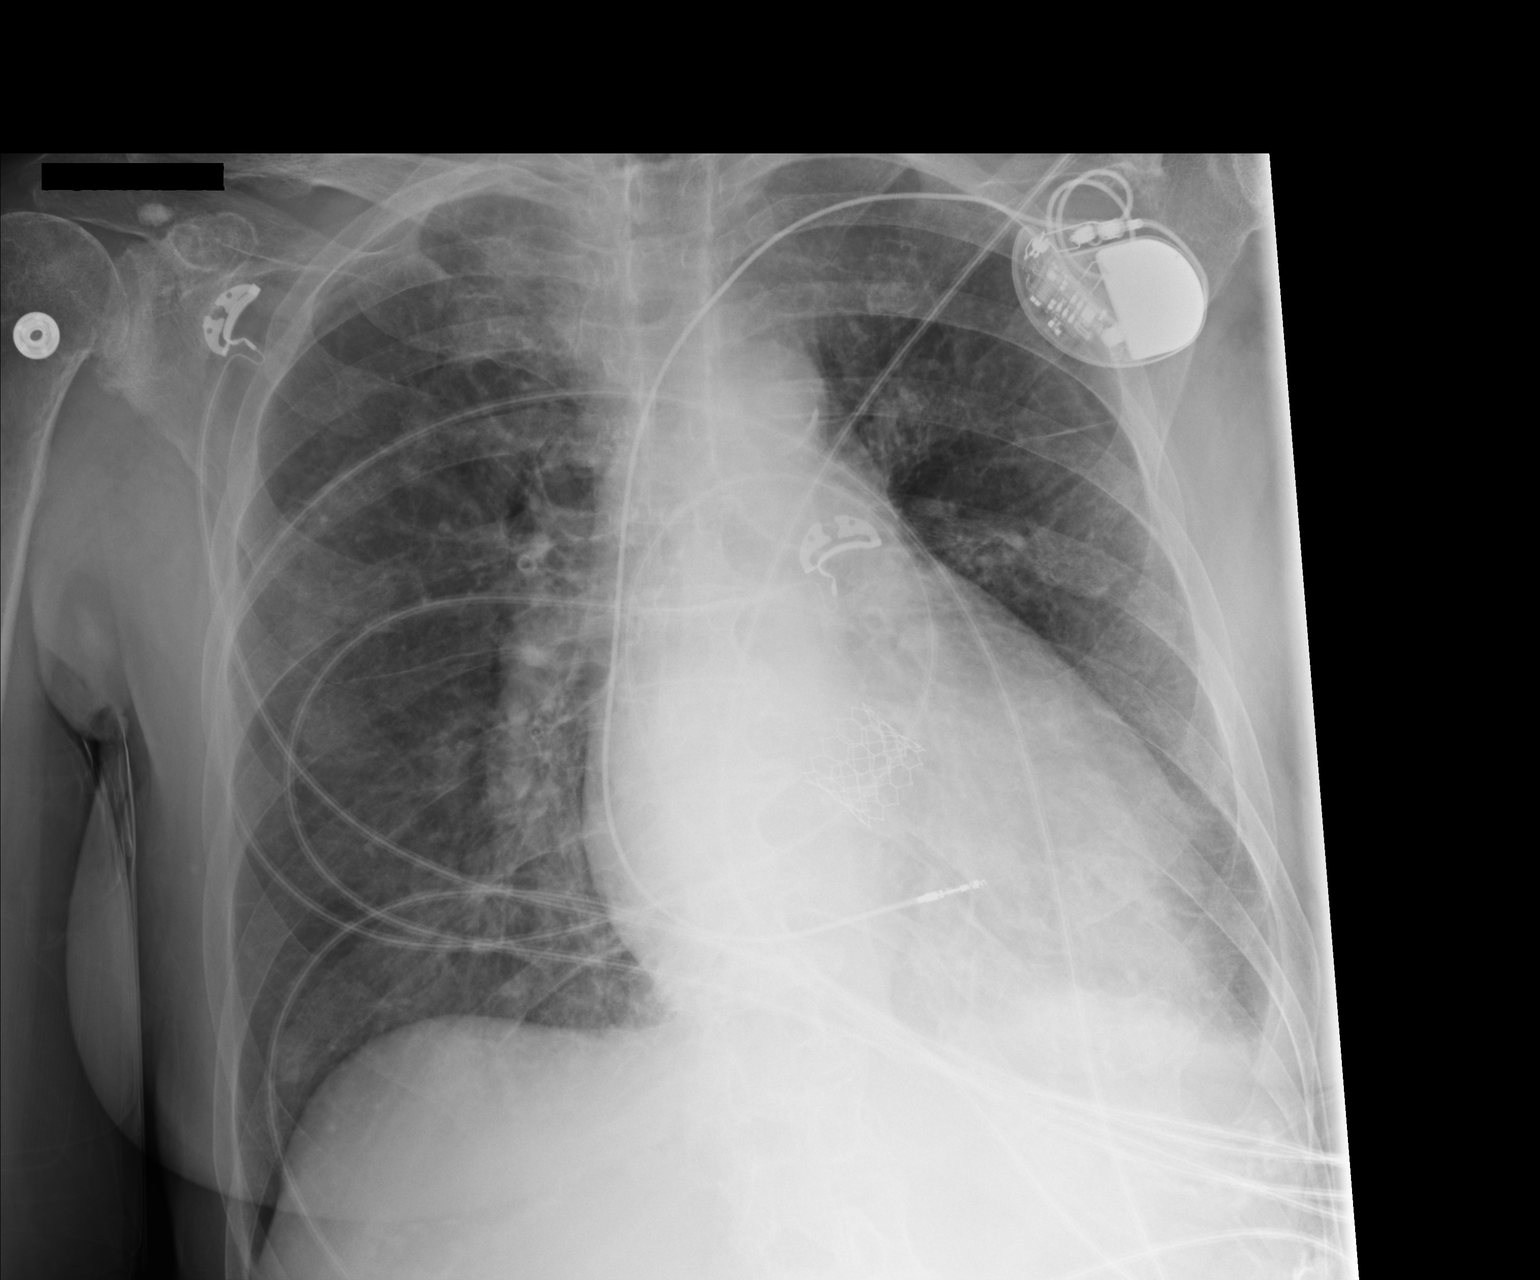

[1 of 1 positions shown; findings below may reference images not displayed]

FINDINGS: A small left pleural effusion is again noted, similar in appearance
to the prior study. The right-sided pleural effusion appears to have
largely resolved. Vascular congestion is noted. Increased
interstitial markings may reflect mild interstitial edema or
possibly pneumonia. No pneumothorax is seen.

The cardiomediastinal silhouette is borderline normal in size. A
stent is noted overlying the heart. A pacemaker is noted overlying
the left chest wall, with a lead ending overlying the right
ventricle. No acute osseous abnormalities are seen. Degenerative
change is noted at the right glenohumeral joint.
IMPRESSION: Small left pleural effusion, similar in appearance to the prior
study. Right-sided pleural effusion appears to have largely
resolved. Vascular congestion noted. Increased interstitial markings
may reflect mild interstitial edema or possibly pneumonia.

## 2017-01-11 IMAGING — CR DG CHEST 1V PORT
1 series · 1 of 1 positions shown · non-contrast
Comparison: Chest radiograph performed earlier today at [DATE] p.m.

CLINICAL DATA: Chest tube placement.  Initial encounter.

EXAM:
PORTABLE CHEST 1 VIEW

[AP]
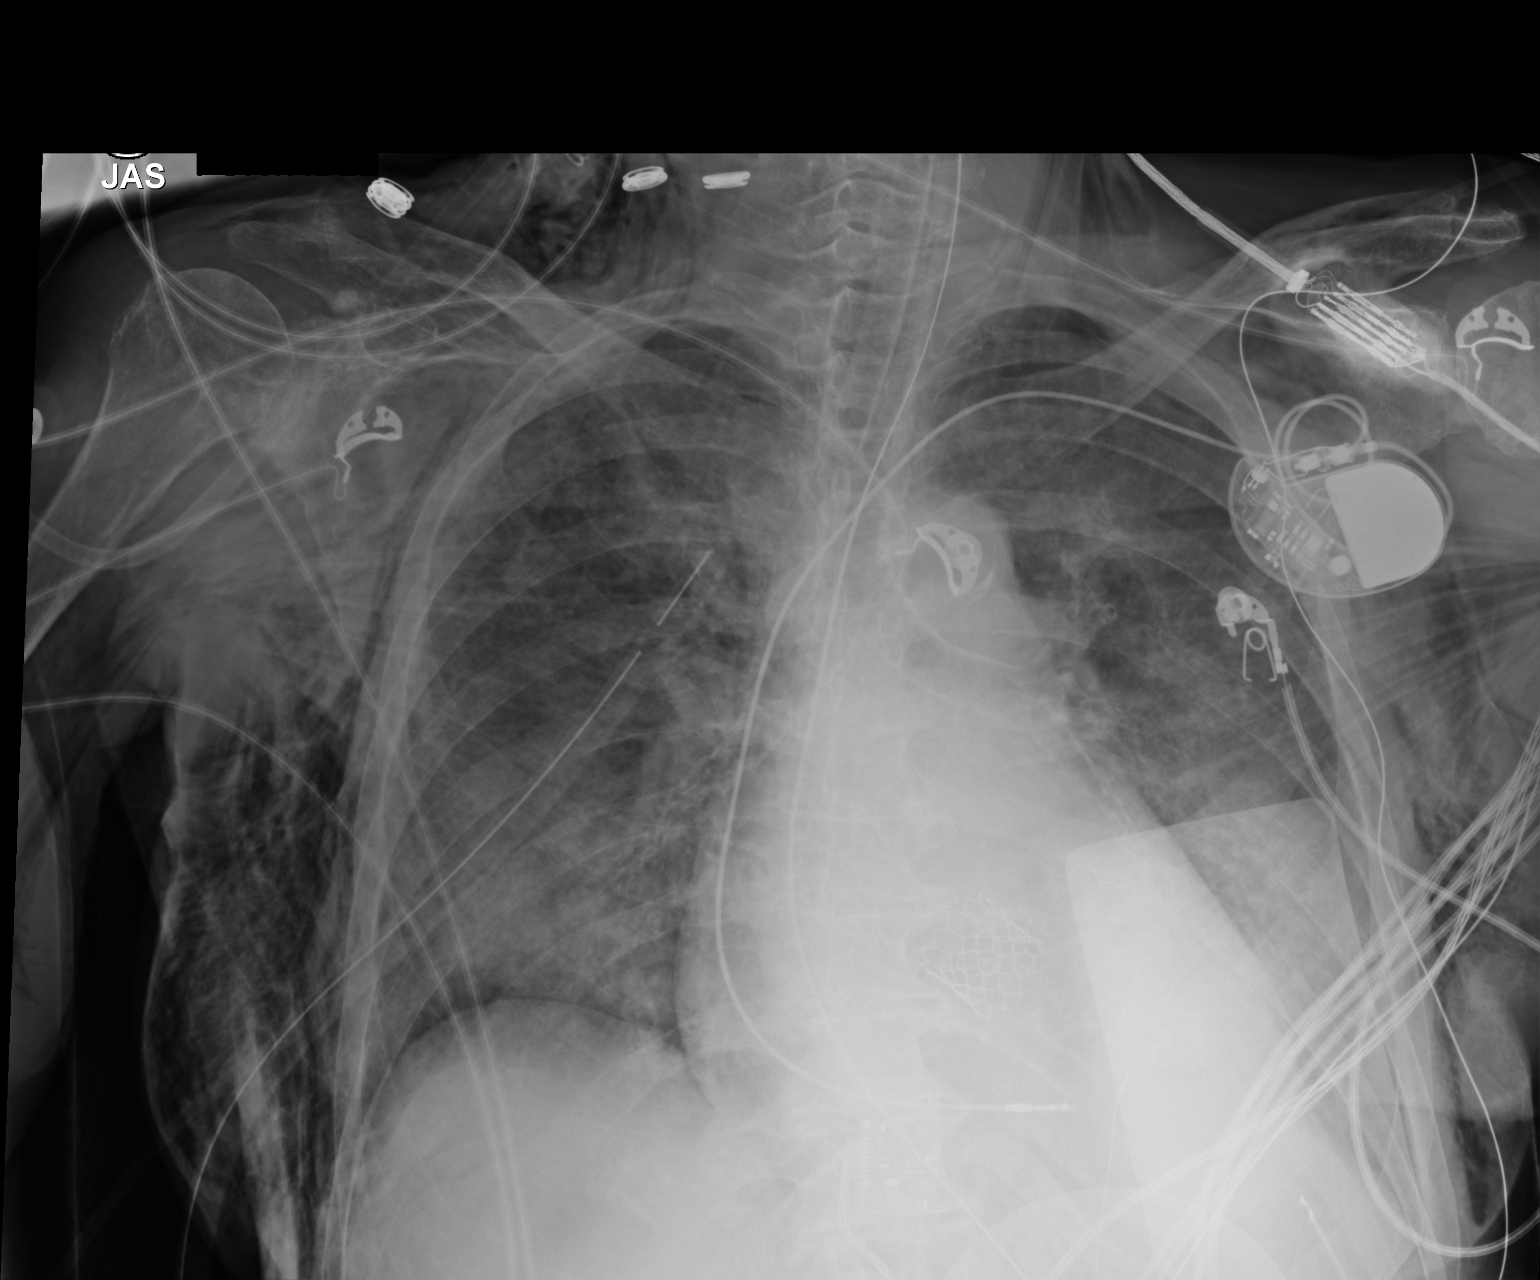

[1 of 1 positions shown; findings below may reference images not displayed]

FINDINGS: The patient's endotracheal tube is seen ending 4-5 cm above the
carina. An enteric tube is noted extending below the diaphragm. A
right-sided chest tube is noted. No significant pneumothorax is
seen.

Worsening bilateral airspace opacification may reflect pneumonia or
pulmonary edema. ARDS is a concern. No definite pleural effusion is
seen.

The cardiomediastinal silhouette is borderline enlarged. A pacemaker
is noted overlying the left chest wall, with a lead ending overlying
the right ventricle. A vascular stent is noted overlying the heart.
Prominent soft tissue air is noted bilaterally, significantly
increased on the right and tracking into the neck.

External pacing pads are noted.
IMPRESSION: 1. Endotracheal tube seen ending 4-5 cm above the carina.
2. Right-sided chest tubes noted, without definite evidence of
pneumothorax.
3. Worsening bilateral airspace opacification may reflect pneumonia
or pulmonary edema. ARDS is a concern.
4. Borderline cardiomegaly.
5. Significantly worsened right-sided soft tissue air, tracking into
the neck, status post placement of right-sided chest tube.

## 2017-01-12 IMAGING — CR DG CHEST 1V PORT
1 series · 1 of 1 positions shown · non-contrast
Comparison: 08/23/2016 chest radiograph.

CLINICAL DATA: 89 y/o  F; encounter for central line placement.

EXAM:
PORTABLE CHEST 1 VIEW

[AP]
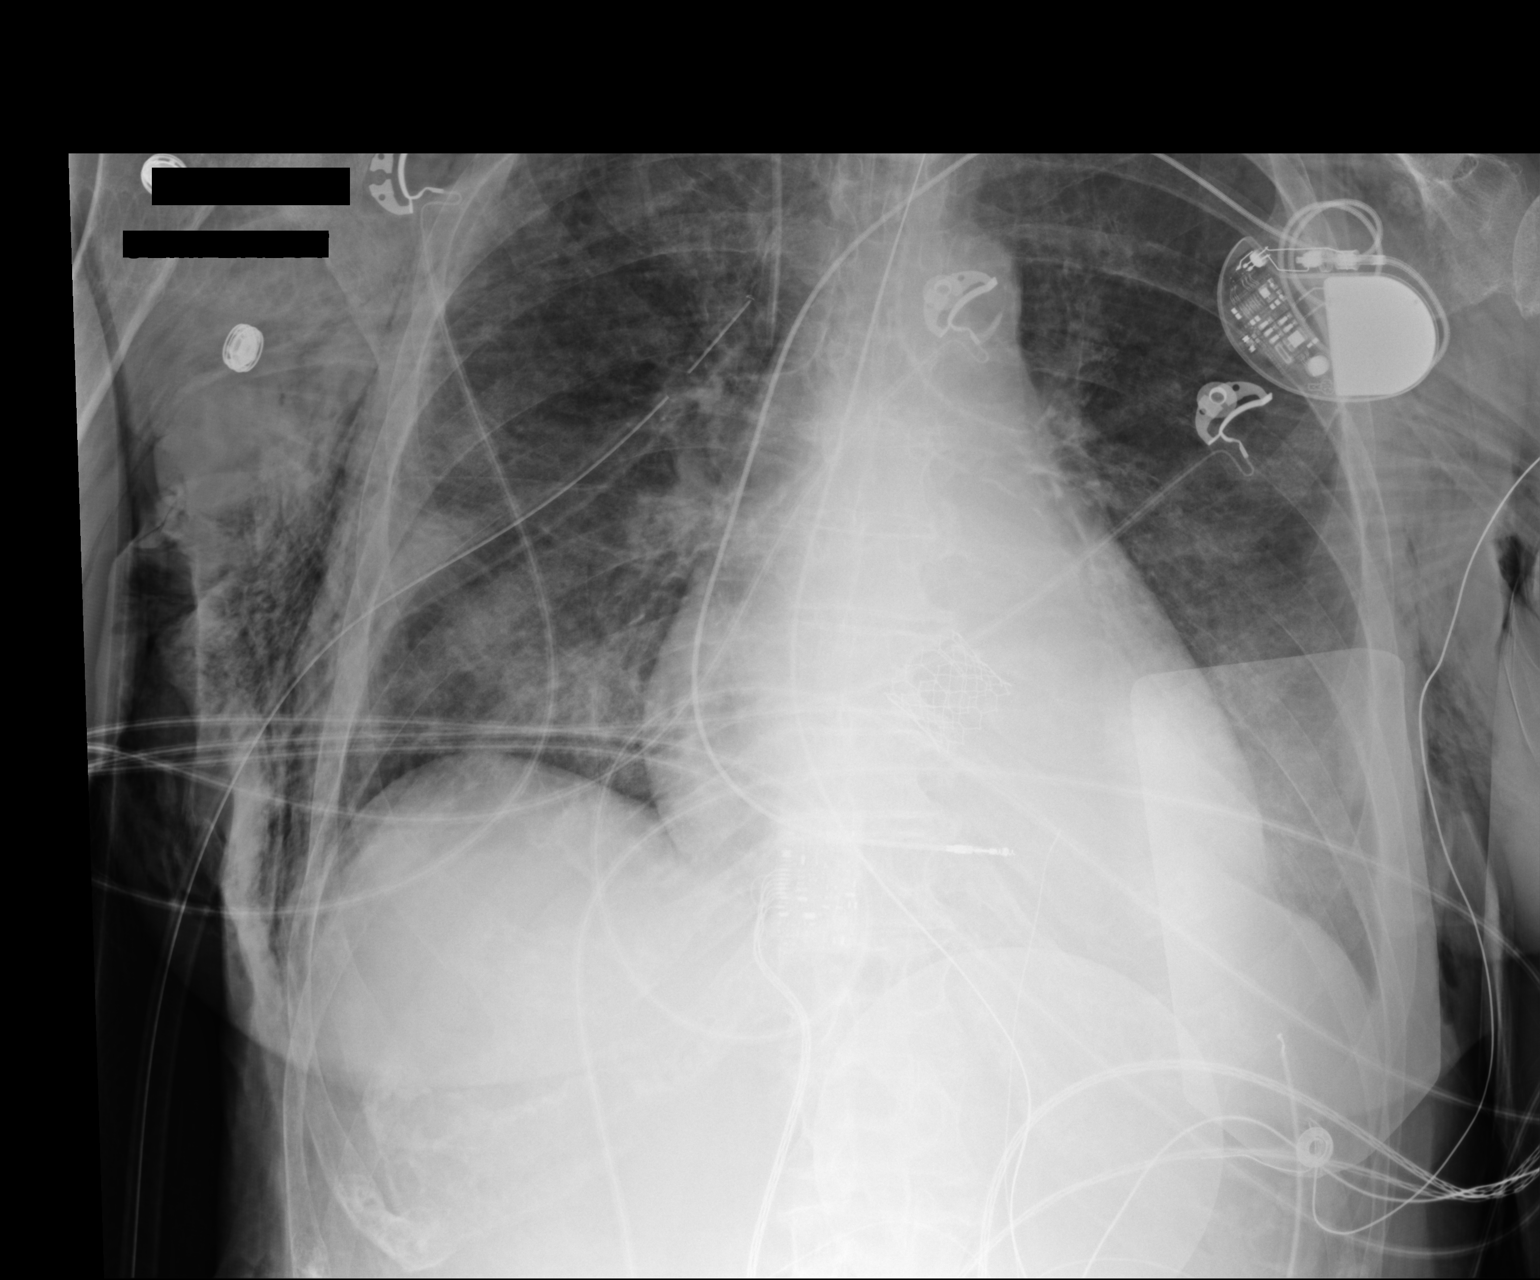

[1 of 1 positions shown; findings below may reference images not displayed]

FINDINGS: Stable cardiac silhouette given projection and technique. Aortic
valve replacement. Right central venous catheter tip projects over
upper SVC. Right-sided chest tube unchanged in position. Extensive
subcutaneous emphysema over the right-sided chest wall. Single lead
pacemaker. Additional electronic device projects over lower
mediastinum. Transcutaneous pacing pads on left chest wall. Trace if
any residual right-sided pneumothorax with accentuation of
silhouettes. Endotracheal tube unchanged in position. Stable patchy
airspace opacities.
IMPRESSION: Right central venous catheter tip projects over upper SVC. Other
lines and tubes are stable. Minimal if any right-sided pneumothorax.
Stable patchy airspace opacities may represent edema, pneumonia, or
ARDS.

By: Kaki Jim M.D.

## 2017-01-14 IMAGING — CR DG CHEST 1V PORT
1 series · 1 of 1 positions shown · non-contrast
Comparison: 08/24/2016

CLINICAL DATA: Acute respiratory failure

EXAM:
PORTABLE CHEST 1 VIEW

[AP]
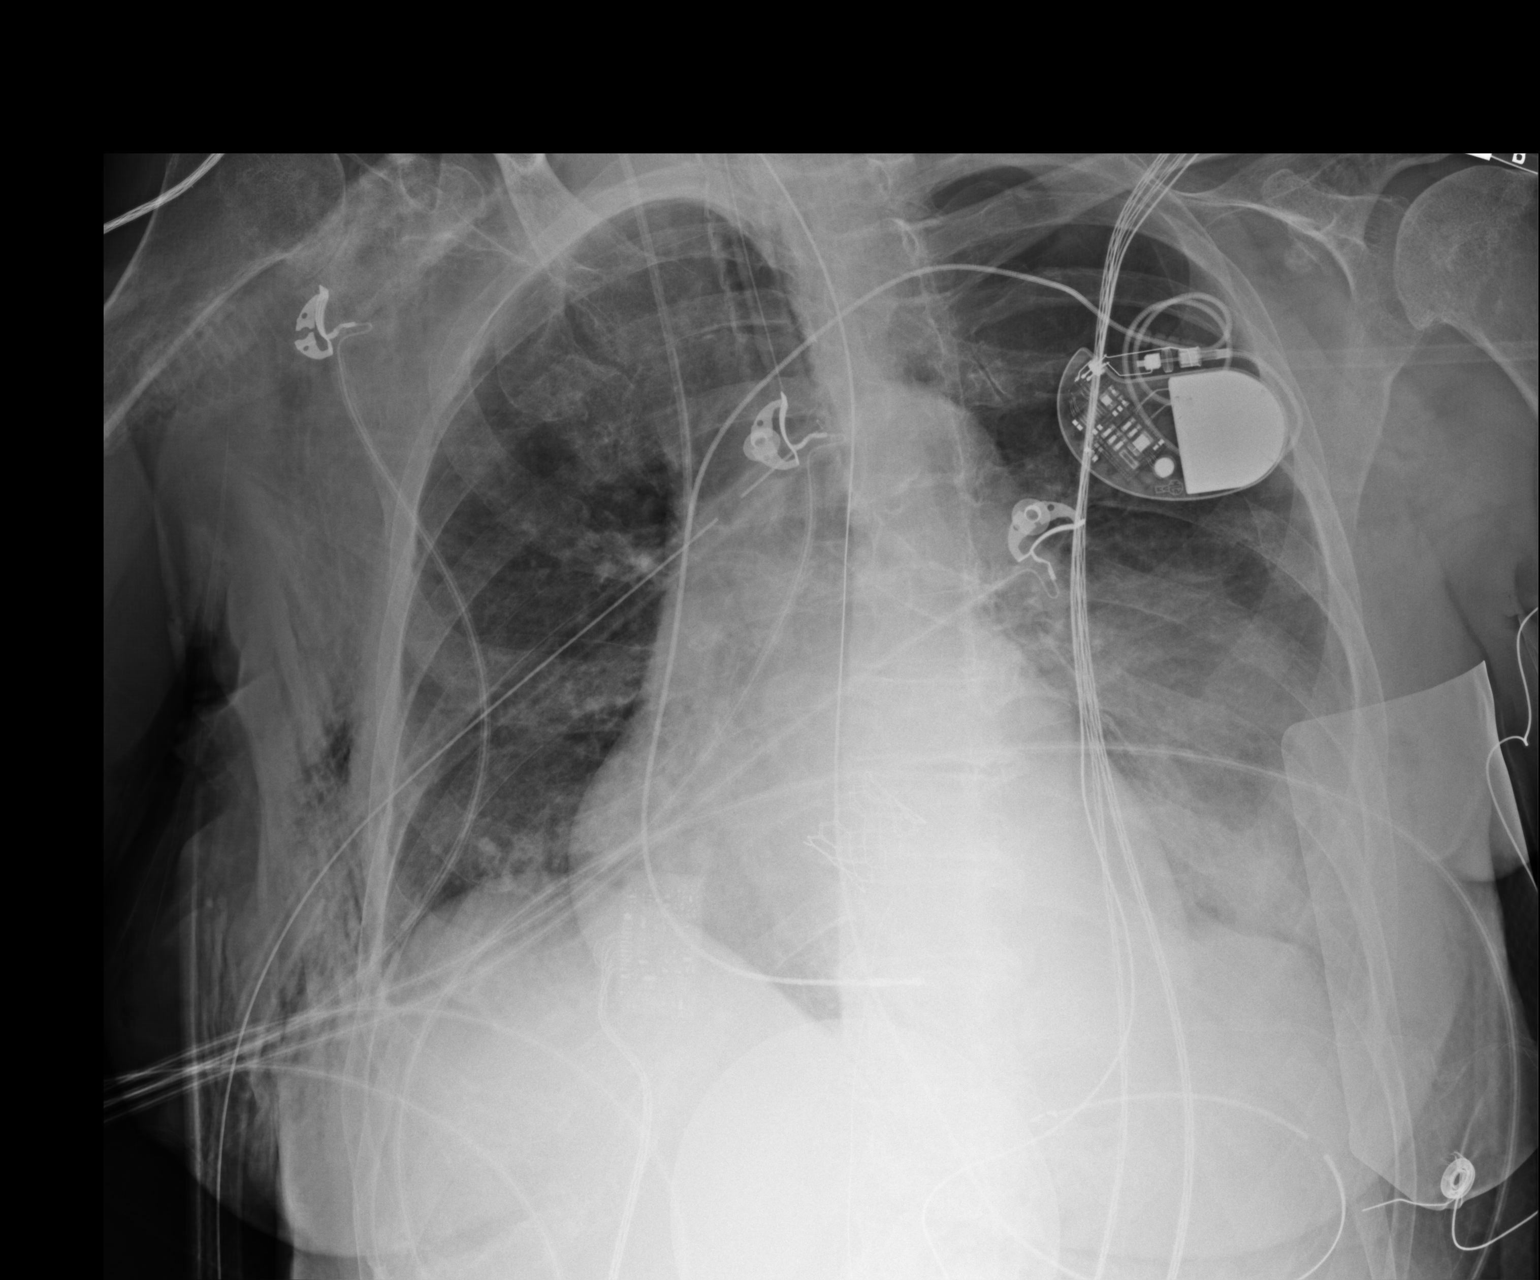

[1 of 1 positions shown; findings below may reference images not displayed]

FINDINGS: Cardiac shadow remains enlarged. Changes consistent with prior TAVR
are noted. Endotracheal tube, nasogastric catheter and right jugular
central line are again seen and stable. Pacing device is again noted
and stable. Considerable right-sided subcutaneous emphysema is noted
but improved from the prior study. Right chest tube is again seen
and stable. Increasing density in the left lung base is noted
consistent with evolving infiltrate.
IMPRESSION: Increasing right basilar infiltrate.

Tubes and lines as described.  No pneumothorax is noted.
